# Patient Record
Sex: Female | Born: 1980 | Race: White | Hispanic: No | Marital: Single | Smoking: Current every day smoker
Health system: Southern US, Community
[De-identification: ages and names within clinical notes are randomized; demographics above are authoritative.]

## PROBLEM LIST (undated history)

## (undated) DIAGNOSIS — N289 Disorder of kidney and ureter, unspecified: Secondary | ICD-10-CM

## (undated) DIAGNOSIS — F419 Anxiety disorder, unspecified: Secondary | ICD-10-CM

## (undated) DIAGNOSIS — F32A Depression, unspecified: Secondary | ICD-10-CM

## (undated) DIAGNOSIS — F329 Major depressive disorder, single episode, unspecified: Secondary | ICD-10-CM

## (undated) DIAGNOSIS — G43909 Migraine, unspecified, not intractable, without status migrainosus: Secondary | ICD-10-CM

## (undated) DIAGNOSIS — F410 Panic disorder [episodic paroxysmal anxiety] without agoraphobia: Secondary | ICD-10-CM

## (undated) HISTORY — PX: TUBAL LIGATION: SHX77

---
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
## 2002-04-25 ENCOUNTER — Inpatient Hospital Stay (HOSPITAL_COMMUNITY): Admission: AD | Admit: 2002-04-25 | Discharge: 2002-04-25 | Payer: Self-pay | Admitting: *Deleted

## 2002-04-25 ENCOUNTER — Encounter: Payer: Self-pay | Admitting: *Deleted

## 2002-05-16 ENCOUNTER — Inpatient Hospital Stay (HOSPITAL_COMMUNITY): Admission: AD | Admit: 2002-05-16 | Discharge: 2002-05-16 | Payer: Self-pay | Admitting: Obstetrics and Gynecology

## 2002-06-03 ENCOUNTER — Ambulatory Visit (HOSPITAL_COMMUNITY): Admission: RE | Admit: 2002-06-03 | Discharge: 2002-06-03 | Payer: Self-pay | Admitting: Obstetrics and Gynecology

## 2002-06-03 ENCOUNTER — Encounter: Payer: Self-pay | Admitting: Obstetrics and Gynecology

## 2002-06-22 ENCOUNTER — Inpatient Hospital Stay (HOSPITAL_COMMUNITY): Admission: AD | Admit: 2002-06-22 | Discharge: 2002-06-22 | Payer: Self-pay | Admitting: Obstetrics and Gynecology

## 2002-07-15 ENCOUNTER — Inpatient Hospital Stay (HOSPITAL_COMMUNITY): Admission: AD | Admit: 2002-07-15 | Discharge: 2002-07-18 | Payer: Self-pay | Admitting: Obstetrics and Gynecology

## 2004-02-01 ENCOUNTER — Inpatient Hospital Stay (HOSPITAL_COMMUNITY): Admission: AD | Admit: 2004-02-01 | Discharge: 2004-02-02 | Payer: Self-pay | Admitting: Gynecology

## 2004-02-28 ENCOUNTER — Other Ambulatory Visit: Admission: RE | Admit: 2004-02-28 | Discharge: 2004-02-28 | Payer: Self-pay | Admitting: Obstetrics and Gynecology

## 2004-04-17 ENCOUNTER — Inpatient Hospital Stay (HOSPITAL_COMMUNITY): Admission: AD | Admit: 2004-04-17 | Discharge: 2004-04-17 | Payer: Self-pay | Admitting: Obstetrics and Gynecology

## 2004-06-17 ENCOUNTER — Emergency Department (HOSPITAL_COMMUNITY): Admission: EM | Admit: 2004-06-17 | Discharge: 2004-06-17 | Payer: Self-pay | Admitting: Family Medicine

## 2004-07-31 ENCOUNTER — Inpatient Hospital Stay (HOSPITAL_COMMUNITY): Admission: AD | Admit: 2004-07-31 | Discharge: 2004-07-31 | Payer: Self-pay | Admitting: Obstetrics and Gynecology

## 2004-08-26 ENCOUNTER — Encounter (INDEPENDENT_AMBULATORY_CARE_PROVIDER_SITE_OTHER): Payer: Self-pay | Admitting: Specialist

## 2004-08-26 ENCOUNTER — Inpatient Hospital Stay (HOSPITAL_COMMUNITY): Admission: RE | Admit: 2004-08-26 | Discharge: 2004-08-29 | Payer: Self-pay | Admitting: Obstetrics and Gynecology

## 2004-08-30 ENCOUNTER — Observation Stay (HOSPITAL_COMMUNITY): Admission: AD | Admit: 2004-08-30 | Discharge: 2004-08-31 | Payer: Self-pay | Admitting: Obstetrics and Gynecology

## 2005-03-28 ENCOUNTER — Inpatient Hospital Stay (HOSPITAL_COMMUNITY): Admission: AD | Admit: 2005-03-28 | Discharge: 2005-03-28 | Payer: Self-pay | Admitting: Obstetrics and Gynecology

## 2005-04-04 ENCOUNTER — Other Ambulatory Visit: Admission: RE | Admit: 2005-04-04 | Discharge: 2005-04-04 | Payer: Self-pay | Admitting: Obstetrics and Gynecology

## 2005-06-10 ENCOUNTER — Emergency Department (HOSPITAL_COMMUNITY): Admission: EM | Admit: 2005-06-10 | Discharge: 2005-06-11 | Payer: Self-pay | Admitting: Emergency Medicine

## 2005-07-04 ENCOUNTER — Inpatient Hospital Stay (HOSPITAL_COMMUNITY): Admission: AD | Admit: 2005-07-04 | Discharge: 2005-07-04 | Payer: Self-pay | Admitting: Obstetrics and Gynecology

## 2005-10-02 ENCOUNTER — Emergency Department (HOSPITAL_COMMUNITY): Admission: EM | Admit: 2005-10-02 | Discharge: 2005-10-02 | Payer: Self-pay | Admitting: Family Medicine

## 2006-07-07 ENCOUNTER — Inpatient Hospital Stay (HOSPITAL_COMMUNITY): Admission: AD | Admit: 2006-07-07 | Discharge: 2006-07-07 | Payer: Self-pay | Admitting: Obstetrics and Gynecology

## 2006-08-31 ENCOUNTER — Inpatient Hospital Stay (HOSPITAL_COMMUNITY): Admission: AD | Admit: 2006-08-31 | Discharge: 2006-09-01 | Payer: Self-pay | Admitting: Obstetrics

## 2006-11-17 ENCOUNTER — Emergency Department (HOSPITAL_COMMUNITY): Admission: EM | Admit: 2006-11-17 | Discharge: 2006-11-17 | Payer: Self-pay | Admitting: Family Medicine

## 2006-11-18 ENCOUNTER — Emergency Department (HOSPITAL_COMMUNITY): Admission: EM | Admit: 2006-11-18 | Discharge: 2006-11-18 | Payer: Self-pay | Admitting: Emergency Medicine

## 2008-05-01 ENCOUNTER — Emergency Department (HOSPITAL_COMMUNITY): Admission: EM | Admit: 2008-05-01 | Discharge: 2008-05-01 | Payer: Self-pay | Admitting: Family Medicine

## 2010-11-22 NOTE — Op Note (Signed)
NAMESIMREN, POPSON             ACCOUNT NO.:  [id number]   MEDICAL RECORD NO.:  [id number]          PATIENT TYPE:  INP   LOCATION:  9108                          FACILITY:  [facility]   PHYSICIAN:  Osborn Coho, M.D.   DATE OF BIRTH:  [birth year]   DATE OF PROCEDURE:  08/26/2004  DATE OF DISCHARGE:                                 OPERATIVE REPORT   PREOPERATIVE DIAGNOSES:  1.  Term intrauterine pregnancy at 39 weeks.  2.  Elective repeat cesarean section.  3.  Desires permanent sterilization.  4.  Multiparity.   POSTOPERATIVE DIAGNOSES:  1.  Term intrauterine pregnancy at 39 weeks.  2.  Elective repeat cesarean section.  3.  Desires permanent sterilization.  4.  Multiparity.   PROCEDURE:  Repeat cesarean section and bilateral tubal ligation.   SURGEON:  Osborn Coho, M.D.   ASSISTANT:  Elby Showers. Williams, C.N.M.   ANESTHESIA:  Spinal.   SPECIMENS:  Placenta and bilateral portions of fallopian tubes.   FLUIDS:  3000 cc.   ESTIMATED BLOOD LOSS:  800 cc.   URINE OUTPUT:  200 cc.   COMPLICATIONS:  None.   FINDINGS:  Live female infant with Apgars of 8 at one minute and 9 at five  minutes.  Normal-appearing bilateral ovaries and fallopian tubes.   DESCRIPTION OF PROCEDURE:  The patient was taken to the operating room after  the risks, benefits, and alternatives were discussed with the patient.  The  patient verbalized understanding and consent signed and witnessed.  The  patient was given a spinal anesthetic and prepped and draped in the normal  sterile fashion.   A Pfannenstiel skin incision was made at the site of the prior scar and  carried down to the underlying layer of fascia which was extended  bilaterally in the midline and extended bilateral with the Mayo scissors.  Kocher clamps were placed on the inferior aspect of the fascial incision and  the rectus muscle excised from the fascia.  The same was done on the  superior aspect of the fascial incision.  The  rectus muscle was separated in  the midline and the peritoneum entered bluntly.  The peritoneum was manually  extended and bladder blade placed.  A window in the lower uterine segment  was noted while bladder flap created with the Metzenbaum scissors.  The  uterine incision was made with the scalpel and extended bilaterally with the  bandage scissors.  The infant was delivered.  The oropharynx and nasopharynx  were bulb suctioned.  The remainder of the infant was delivered without  difficulty.  There was no nuchal cord.  Cefoxitin was administered after  delivery of the infant and cord clamped and cut.  The cord was clamped and  cut, and the infant was handed to the awaiting pediatrician.  Cord bloods  were collected.  The placenta was delivered via fundal massage.  The uterus  was exteriorized and cleared of all clots and debris.  He uterine incision  was repaired with 0 Vicryl in a running locked fashion.  A second  imbricating layer was performed.  A small hematoma  at the left lateral  aspect of the uterine incision was made hemostatic with a figure-of-eight  stitch.  No extension was noted.   The bilateral fallopian tubes were carried out to their fimbriated ends and  tubal ligation performed.  A Babcock was placed on the midportion of the  fallopian tube and two 0 plain ties used to ligate the tubes.  The bilateral  tubes were excised and sent to pathology.  The remaining pedicles were  cauterized with the Bovie.  Good hemostasis of the fallopian tubes were  noted before and after returning the uterus to the intra-abdominal cavity.  The intra-abdominal cavity was irrigated.  The uterine incision was  revisited and noted to be hemostatic.  The peritoneum was closed with 3-0  chromic in a running fashion.  The fascia was repaired with 0 Vicryl in a  running fashion.  The subcutaneous tissue was irrigated and made hemostatic  with the Bovie.  A subcuticular stitch was used to perform  closure of the  skin using 3-0 Monocryl.  Sponge, lap, and needle counts were correct.   The patient tolerated the procedure well and was returned to the recovery  room in good condition.      AR/MEDQ  D:  08/26/2004  T:  08/26/2004  Job:  [id number]

## 2010-11-22 NOTE — Op Note (Signed)
NAME:  Sally [name], Sally [name]                       ACCOUNT NO.:  [id number]   MEDICAL RECORD NO.:  [id number]                   PATIENT TYPE:  INP   LOCATION:  9198                                 FACILITY:  [facility]   PHYSICIAN:  Crist Fat. Rivard, M.D.              DATE OF BIRTH:  1981-04-12   DATE OF PROCEDURE:  07/15/2002  DATE OF DISCHARGE:                                 OPERATIVE REPORT   PREOPERATIVE DIAGNOSIS:  Intrauterine term pregnancy with previous cesarean  section, desiring repeat cesarean section.   POSTOPERATIVE DIAGNOSIS:  Intrauterine term pregnancy with previous cesarean  section, desiring repeat cesarean section.   ANESTHESIA:  Spinal.   PROCEDURE:  Repeat low transverse cesarean section.   SURGEON:  Crist Fat. Rivard, M.D.   ASSISTANT:  Elby Showers. Williams, C.N.M.   ESTIMATED BLOOD LOSS:  600 cubic centimeters.   DESCRIPTION OF PROCEDURE:  After being informed of the planned procedure  with possible complications, including bleeding, infection, injury to  bowels, bladder, or ureter, informed consent was obtained.  The patient was  taken to cesarean suite and given spinal anesthesia without any  complication.  She was placed in the dorsal decubitus position, pelvis  tilted to the left, prepped and draped in a sterile fashion, and a Foley  catheter was inserted in her bladder.   After assessing adequate level of anesthesia, the previous Pfannenstiel  incision was infiltrated with Marcaine 0.25% 20 cubic centimeters, and we  proceeded with a Pfannenstiel incision,  which was brought down bluntly to  the fascia.  Fascia was then incised transversely, linea alba was dissected,  and peritoneum was entered in a midline fashion.  Visceral peritoneum was  entered in low transverse fashion, allowing [place] to safely retract bladder by  developing a bladder flap.  Myometrium was then entered in a low transverse  fashion, first with knife, then bluntly.  Amniotic fluid was clear.   We  assisted the birth of a female infant in ROA presentation with a vacuum  extraction.  Baby was born at 11:12.  Mouth and nose were suctioned, cord  was clamped with two Kelly clamps and sectioned, and the baby was given to  the pediatrician present in the room.  Placenta was delivered spontaneously,  was complete.  The cord had three vessels, and uterine revision was  negative.  We then proceeded with closure of myometrium in two layers, first  with a running locked suture of 0 Vicryl, then with a Lembert suture of 0  Vicryl following the first one.  Hemostasis was completed with cautery on  the peritoneal edge.  Both pericolic gutters were cleansed, both tubes and  ovaries assessed and adequate.  Hemostasis was rechecked and adequate.  Under-fascia hemostasis was completed with cautery, and fascia was closed  with two running sutures of 0 Vicryl, meeting midline.  Fat subcutaneous  tissues were irrigated with warm saline, hemostasis was completed with  cautery, and skin was closed with staples.   The baby received an Apgar of 9 at one minute, 9 at five minutes, is named  [name], and weighs 6 pounds 9 ounces.  Estimated blood loss of 600  cubic centimeters.  Instrument and sponge count is complete x2.  The  procedure was very well-tolerated by the patient, who is taken to the  recovery room in a well and stable condition.                                                Crist Fat Rivard, M.D.    SAR/MEDQ  D:  07/15/2002  T:  07/15/2002  Job:  [id number]

## 2010-11-22 NOTE — H&P (Signed)
Sally [name], Sally [name]             ACCOUNT NO.:  [id number]   MEDICAL RECORD NO.:  [id number]          PATIENT TYPE:  INP   LOCATION:  9198                          [facility]ACILITY:  [facility]   PHYSICIAN:  Osborn Coho, M.D.   DATE OF BIRTH:  21-May-1981   DATE OF ADMISSION:  08/26/2004  DATE OF DISCHARGE:                                HISTORY & PHYSICAL   HISTORY OF PRESENT ILLNESS:  This is a 30 year old gravida 3 para 2-0-0-2 at  [redacted] weeks gestation who presents for an elective repeat cesarean section.  Pregnancy has been followed by Dr. Su Hilt and remarkable for:  1.  Previous C-section x2.  2.  History of oligohydramnios.  3.  History of migraines.  4.  Smoker.  5.  Desires elective sterilization.   PRENATAL LABORATORY DATA:  Hemoglobin 12.3, platelets 264.  Blood type O  positive, antibody screen negative. RPR nonreactive. Rubella immune.  Hepatitis negative. HIV negative.   OBSTETRICAL HISTORY:  Remarkable for C-section in 2001 of a female infant at  [redacted] weeks gestation weighing 6 pounds 3 ounces remarkable for oligohydramnios  and breech. She had a repeat C-section in 2004 of a female infant at [redacted] weeks  gestation weighing 6 pounds 6 ounces which was uncomplicated.   MEDICAL HISTORY:  1.  Remarkable for anemia with pregnancy.  2.  History of hyperemesis  3.  History of oligohydramnios with the first pregnancy.  4.  Childhood varicella.  5.  History of migraines.  6.  The patient is also a smoker, three cigarettes per day.   FAMILY HISTORY:  Remarkable for a grandmother and grandfather with MI. Two  grandmothers with hypertension. Mother with varicosities. Grandmother with  diabetes. Aunt with renal failure. Maternal relatives with unknown type of  cancer. Aunt with bipolar disorder.   SURGICAL HISTORY:  Remarkable for C-sections x2.   GENETIC HISTORY:  Remarkable for a brother with a heart murmur.   SOCIAL HISTORY:  The patient is single. Father of the baby, Viviann Spare, is  involved and supportive. She is a Futures trader. She does not report a religious  affiliation. She denies any alcohol or drug abuse but does smoke cigarettes.   HISTORY OF CURRENT PREGNANCY:  The patient entered care at [redacted] weeks  gestation. She had some depression at 16 weeks and was referred for  counseling. Quad screen was normal. Ultrasound at 18 weeks was normal. She  had some migraine headaches and was referred to the headache center for  that. She signed tubal ligation papers at 24 weeks. The rest of the  pregnancy was uneventful.   OBJECTIVE DATA:  VITAL SIGNS:  Stable, afebrile.  HEENT:  Within normal limits. Thyroid normal, not enlarged.  CHEST:  Clear to auscultation.  HEART:  Regular rate and rhythm.  ABDOMEN:  Gravid, 36 cm, vertex to Leopold's. Fetal heart rate 150.  PELVIC:  Deferred.  EXTREMITIES:  Within normal limits.   ASSESSMENT:  1.  Intrauterine pregnancy at 39 weeks.  2.  Previous cesarean section x2.  3.  Desires repeat cesarean section.  4.  Desires elective sterilization.   PLAN:  Admit to operating suite for repeat C-section and tubal ligation.      MLW/MEDQ  D:  08/26/2004  T:  08/26/2004  Job:  [id number]

## 2010-11-22 NOTE — H&P (Signed)
[name], [name]             ACCOUNT NO.:  [id number]   MEDICAL RECORD NO.:  [id number]          PATIENT TYPE:  INP   LOCATION:  9306                          [facility]ACILITY:  [facility]   PHYSICIAN:  Naima A. Dillard, M.D. DATE OF BIRTH:  Sep 13, 1980   DATE OF ADMISSION:  08/30/2004  DATE OF DISCHARGE:                                HISTORY & PHYSICAL   HISTORY OF PRESENT ILLNESS:  Sally [name] is a 30 year old white female  gravida 3, para 3-0-0-3, now four days status post repeat low transverse  cesarean section who was seen at the office today for evaluation complaining  of severe mid back pain.  She has rated her pain throughout the day today as  a 10/10 and reports no relief with Tylox or Motrin.  Her back pain started  earlier today.  She denies any nausea, vomiting or malaise.  She also  reports a bad cough which she started while she was in the hospital.  She  reported a fever of 100.1 earlier today but none subsequently and her last  dose of Motrin was at 10 a.m. this morning.  She is bottle feeding without  difficulty.  She denies any breast tenderness but reports fullness.  She  denies any increased abdominal pain, increased lochia or any other  complaints at this time.  She does report smoking.   PAST OBSTETRICAL HISTORY:  She is a gravida 3, para 3-0-0-3, most recently  delivered four days ago by repeat low transverse cesarean section.  She is  currently bottle feeding her infant.  She has no other contributory GYN  history.   ALLERGIES:  She has no known drug allergies.   PAST SURGICAL HISTORY:  C-section x3.   PAST MEDICAL HISTORY:  She reports a history of walking pneumonia when she  was in high school.   FAMILY HISTORY:  Noncontributory.   SOCIAL HISTORY:  She is a 30 year old white female who lives with her mother  and has good support from her family.  She is a smoker, approximately three  cigarettes a day.  She denies any illicit drug use or alcohol use.  She  has  no religious or cultural beliefs that might affect her care.   PHYSICAL EXAMINATION:  VITAL SIGNS:  Temperature is 98.6, pulse is 84,  respirations 20, blood pressure 125/85.  Her O2 saturation is 97-98%.  She  is 5 feet 5 inches and 167 pounds today.  CHEST:  Her chest has bilateral lower rubs posteriorly.  She has thick gray-  green sputum that was sent for culture.  HEART:  Regular rhythm and rate.  BREASTS:  Soft, full, nontender with no redness.  ABDOMEN:  Her abdomen has positive bowel sounds, soft and nontender.  Incision is healing well.  EXTREMITIES:  Within normal limits.  She has CVA tenderness on the right  side and tenderness to pressure in the middle of her back near the epidural  site.  PELVIC:  Her pelvic exam was deferred at this time.  Her lochia is within  normal limits.   LABORATORY DATA:  Her WBC count is 21.5,  previously was 11.8.  Her  hemoglobin is 12.4.  It was previously 11.1.  Her hematocrit is 36.7.  It  was previously 32.1.  Platelets are 353,000, previously 233,000.  Neutrophils are 88%.  Absolute neutrophils are 18.9%.  Her SGOT is 42, SGPT  is 33 and her BUN is 7, creatinine is 0.7.  Clean catch urine is completely  negative except for moderate blood.   ASSESSMENT:  1.  Four days post repeat low transverse cesarean section.  2.  Upper respiratory infection.  3.  Back pain.  Questionable muscle spasms versus kidney stone.   PLAN:  Per consult with Dr. Normand Sloop to admit for IV antibiotic therapy, pain  management and closer observation.      SJD/MEDQ  D:  08/30/2004  T:  08/30/2004  Job:  [id number]

## 2010-11-22 NOTE — Discharge Summary (Signed)
Sally [name], Sally [name]             ACCOUNT NO.:  [id number]   MEDICAL RECORD NO.:  [id number]          PATIENT TYPE:  INP   LOCATION:  9306                          [facility]   PHYSICIAN:  Sally [name], M.D. DATE OF BIRTH:  1980/12/14   DATE OF ADMISSION:  08/30/2004  DATE OF DISCHARGE:  08/31/2004                                 DISCHARGE SUMMARY   ADMISSION DIAGNOSES:  1.  Four days postoperative cesarean section.  2.  Upper respiratory tract infection.  3.  Back pain, differential diagnosis muscle spasms versus kidney stone.   DISCHARGE DIAGNOSES:  1.  Upper respiratory tract infection.  2.  Five days postoperative cesarean section.  3.  Back pain.   PROCEDURE:  IV antibiotics.   HOSPITAL COURSE:  Sally [name] is a 30 year old gravida 3, para 3-0-0-3,  who was admitted on August 30, 2004, at four days postoperative cesarean  section, who was seen at the office for evaluation of severe mid back pain.  She also had an upper respiratory tract infection which had begun while she  was in the hospital.  She was bottle feeding, but having no difficulty with  engorgement.  She denied any other symptoms.  On admission, she was noted to  have bilateral lower rubs as chest sounds.  She was having thick, green-gray  sputum which was cultured, results are pending.  Her incision was clean,  dry, and intact.  Her white blood cell count was 21.5 with 88 neutrophils,  previous value prior to discharge from the hospital during her cesarean  section was 11.8.  Hemoglobin was 12.4 and platelets were 353.  SGOT was 42  and SGPT was 33.  Creatinine was normal.  Clean catch urine was negative  except for moderate blood.  Anesthesia was consulted, Sally [name], M.D.  felt that based on the symptoms, it was likely that she was having muscle  spasms and inflammation in addition to the upper respiratory tract  infection.  She was placed on IV antibiotics and pain management.  By the  morning of August 31, 2004, she was feeling better and desired to go home.  She had been placed on Flexeril which was helping her back pain.  She still  had a significant cough.  She had been on Tussionex at home. Her temperature  maximum was 99.9 at 9 p.m. on August 30, 2004.  Lung sounds were still  noted to have adventitious sounds in all lobes.  Abdomen was normal,  incision was clean, dry, and intact.   LABORATORY DATA:  Hemoglobin was 10, white blood cell count down to 11.8,  neutrophils were normal.  SGOT 28, SGPT 25.  She had received one dose of  Zithromax p.o. and one dose of Rocephin the evening before.   The patient was tolerating a regular diet.  Sally [name] was consulted and  she also saw the patient.  Her O2 saturation was 96% on room air.  Her back  pain was improved.  In light of the patient's improvement, her desire to go  home, and her decreased white blood cell count,  it was determined to  discharge the patient home.  She did receive an albuterol treatment prior to  discharge.  Chest x-ray also was negative during her hospital stay.  The  patient was deemed to have received the full benefit of her hospital stay  and was discharged home.   DISCHARGE INSTRUCTIONS:  The patient is to continue postoperative cesarean  section instructions.  She is also to continue monitoring for fever and any  other issues.   DISCHARGE MEDICATIONS:  1.  Z-Pak to be used as directed.  2.  Tussionex 5 mL every 12 hours.  3.  Flexeril 10 mg t.i.d. p.r.n.   FOLLOW UP:  Discharge follow-up will occur in one week at [facility] or p.r.n.      VLL/MEDQ  D:  08/31/2004  T:  09/02/2004  Job:  [id number]

## 2010-11-22 NOTE — H&P (Signed)
[facility]  Patient:    Sally [name], Sally [name]                    MRN: [id number] Adm. Date:  [id number] Attending:  Silverio Lay A                         History and Physical  REASON FOR ADMISSION:         1. Intrauterine pregnancy at 37 weeks and                                  6 days.                               2. Breech presentation.                               3. Severe oligohydramnios.  HISTORY OF PRESENT ILLNESS:   This is an 30 year old, single, white female, gravida 1, para 0, with a due date by ultrasound of June 16, 2000, who was seen at maternity admissions to undergo external cephalic version.  She has a previous history of oligohydramnios, which resolved rapidly with bed rest. Upon ultrasound examination today, the baby is complete breech with an amniotic fluid index of 1.8, which puts her at way below the third percentile. The NST right now is reactive with a baseline in the 150s-160s.  The patient otherwise reports good fetal activity, denies any contractions, denies any symptoms of pregnancy-induced hypertension, and also denies any leaking of fluid or bleeding.  Her prenatal course reveals blood type O+, RPR nonreactive, rubella nonimmune, HBSAG negative, HIV nonreactive, gonorrhea negative, and chlamydia negative. A 16-week AFP was elevated, but with corrected estimated deliver date came back to normal.  A 20-week ultrasound had normal anatomy survey and anterior placenta.  A 28-week glucose tolerance test was within normal limits.  A 33-week ultrasound revealed an estimated fetal weight of 2029 g with a amniotic fluid index.  A 36-week ultrasound revealed a normal estimated fetal weight, but oligohydramnios.  The patient was then put on completed bed rest.  An AFI was re-evaluated 48 hours later and was within normal limits.  A 35-week group B streptococcus was positive.  ALLERGIES:                    No known drug  allergies.  FAMILY HISTORY:               Paternal grandmother with diabetes.  SOCIAL HISTORY:               Single.  Nonsmoker.  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  PHYSICAL EXAMINATION:         Temperature 100.5 degrees, blood pressure normal.  GENERAL APPEARANCE:           In no acute distress.  HEENT:                        Negative.  LUNGS:                        Clear.  HEART:  Normal.  BACK:                         No frank CVA tenderness.  ABDOMEN:                      Gravid, nontender.  Breech presentation.  NST reactive.  PELVIC:                       Vaginal exam deferred.  EXTREMITIES:                  Negative.  LABORATORY RESULTS:           The white blood cell count is elevated at 19.1 and the hemoglobin is normal.  ASSESSMENT:                   Intrauterine pregnancy at 37 weeks and 6 days with severe oligohydramnios and breech presentation with borderline low-grade temperature and increased white blood cell count of unclear etiology.  Group B streptococcus positive.  PLAN:                         The patient will be taken to the OR to undergo primary low transverse cesarean section.  The procedure, as well as complications, including bleeding, infection, and injury to bladder, bowels, and ureters, were discussed with the patient and informed consent was obtained.  The possibility of chorioamnionitis was also discussed and the patient will receive antibiotics preoperatively and will be followed closely postoperatively. DD:  06/01/00 TD:  06/01/00 Job: [id number]

## 2010-11-22 NOTE — Op Note (Signed)
[facility]  Patient:    Sally [name], Sally [name]                    MRN: [id number] Proc. Date: 06/01/00 Adm. Date:  [id number] Attending:  Silverio Lay A                           Operative Report  DATE OF BIRTH:                07/06/1981  PREOPERATIVE DIAGNOSES:       A 38+ week gestation, breech presentation with oligohydramnios.  POSTOPERATIVE DIAGNOSES:      A 38+ week gestation, breech presentation with oligohydramnios, birth of a baby girl.  INTERVENTION:                 Primary low transverse cesarean section.  SURGEON:                      Reuben Likes, M.D.  ASSISTANT:                    Pershing Cox, M.D.  ANESTHESIOLOGIST:             Belva Agee, M.D.  PROCEDURE:                    Under spinal anesthesia the patient is in 15 degree left decubitus position.  She is prepped with Betadine on the abdominal, suprapubic, vulvar, and vaginal areas.  The bladder catheter is inserted and the patient is draped as usual.  A Pfannenstiel incision is made with a scalpel.  We opened the aponeurosis transversely with the Mayo scissors.  The recti muscles are separated from the aponeurosis from the midline.  We opened the parietal peritoneum longitudinally with the Metzenbaum scissors.  The visceroperitoneum is opened transversely on the lower uterine segment with the Metzenbaum scissors.  The bladder is reclined downward and the bladder retractor is inserted.  A transverse incision is made with the scalpel over the lower uterine segment, then it is prolonged on each side with dressing scissors.  Amniotic fluid is clear.  Baby is frank breech with back on the left side.  Birth at 41 of a baby girl.  The cord is clamped and cut. The baby is given to the neonatologists.  Apgars are 8 and 9.  The cord blood is taken.  Spontaneous evacuation of the placenta.  Uterine revision done. The antibiotics Ancef 2 g IV is given.  Pitocin is  started in the IV fluids and the uterus is contracting well.  Closure of the hysterotomy with Vicryl 0 in a full plane locked running suture.  Two hemostatic "X" stitches are made, one in the middle of the incision, the other one at the right angle to complete hemostasis.  Hemostasis is adequate at that level.  We then put the uterus back inside the abdomen.  Both ovaries and both tubes as well as the uterus were normal in appearance and size.  The pericolonic gutters are emptied of blood clots.  Irrigation and suction is done.  We then verify hemostasis on the bladder flap and on the recti muscle.  Hemostasis is completed with the electrocautery.  We then close the aponeurosis with two half running sutures with Vicryl 0.  Hemostasis is completed on the adipose tissue with the electrocautery.  The subcutaneous tissue is  infiltrated with Marcaine 0.25% 20 cc total.  We then reapproximate the skin with staples.  A dry dressing is applied.  The count of compressives and instruments was complete x 2.  The estimated blood loss was 500 cc.  No complication occurred.  The patient was transferred to the recovery room in good status.  Note that the blood group is O+, antibody negative.  Rubella status:  Nonimmune.  She will receive the rubella vaccine postpartum. DD:  06/01/00 TD:  06/01/00 Job: [id number]

## 2010-11-22 NOTE — Discharge Summary (Signed)
Sally [name], Sally [name]             ACCOUNT NO.:  [id number]   MEDICAL RECORD NO.:  [id number]          PATIENT TYPE:  INP   LOCATION:  9108                          FACILITY:  [facility]   PHYSICIAN:  Osborn Coho, M.D.   DATE OF BIRTH:  01-Sep-1980   DATE OF ADMISSION:  08/26/2004  DATE OF DISCHARGE:  08/29/2004                                 DISCHARGE SUMMARY   ADMISSION DIAGNOSES:  1.  Intrauterine pregnancy at [redacted] weeks gestation.  2.  Previous cesarean section x2.  3.  Desires repeat cesarean section.  4.  Desires elective sterilization.   DISCHARGE DIAGNOSES:  1.  Intrauterine pregnancy at [redacted] weeks gestation.  2.  Previous cesarean section x2.  3.  Desires repeat cesarean section.  4.  Desires elective sterilization.  5.  Status post cesarean delivery of a viable female infant, Apgar's 8 and 9      weighing 6 pounds 14 ounces without complications. She is also status      post an elective bilateral tubal ligation without complications.   PROCEDURE:  1.  Spinal anesthesia.  2.  Repeat low transverse cesarean section.  3.  Elective bilateral tubal ligation.   HOSPITAL COURSE:  The patient was admitted for an elective repeat C section  and tubal ligation performed under spinal anesthesia by Dr. Su Hilt with no  complications. Estimated blood loss was 800 mL.  On postoperative day #1,  she did well passing gas with no vomiting, voiding spontaneously, hemoglobin  was 11.1 and routine postop care was given.  On postoperative day #2, she  continued to improve, was up and about more and tolerating her diet.  On  postoperative day #3, she was ready to go home, vital signs were stable, she  was afebrile.  Chest was clear, heart regular rate and rhythm. Abdomen was  soft and appropriately tender, incision was clean, drain intact with  subcuticular stitches. Lochia was small, extremities within normal limits  and was she was deemed to have received the full benefit of her hospital  stay  and was discharged home.   DISCHARGE MEDICATIONS:  1.  Motrin 600 mg p.o. q.6 h. p.r.n.  2.  Tylox 1-2 q.4 h. p.r.n.   DISCHARGE LABS:  White blood cell count 11.8, hemoglobin 11.1, platelets  233, RPR nonreactive.   DISCHARGE INSTRUCTIONS:  Per [facility] handout.   FOLLOW UP:  In six weeks or p.r.n.      MLW/MEDQ  D:  08/29/2004  T:  08/29/2004  Job:  [id number]

## 2010-11-22 NOTE — H&P (Signed)
   NAME:  Sally [name], Sally [name]                       ACCOUNT NO.:  [id number]   MEDICAL RECORD NO.:  [id number]                   PATIENT TYPE:  INP   LOCATION:  9198                                 FACILITY:  [facility]   PHYSICIAN:  Crist Fat. Rivard, M.D.              DATE OF BIRTH:  April 23, 1981   DATE OF ADMISSION:  07/15/2002  DATE OF DISCHARGE:                                HISTORY & PHYSICAL   HISTORY OF PRESENT ILLNESS:  This is a 30 year old gravida 2 para 1-0-0-1 at  23 and three-sevenths weeks who presents for an elective repeat C section.  She denies any leaking, bleeding, or contractions and reports positive fetal  movement.  Pregnancy has been followed by Dr. Estanislado Pandy since 30 weeks and has  been remarkable for:  1. Late to care.  2. Previous low transverse cesarean section; plans elective repeat.  3. UTI at 28 weeks.  4. History of oligohydramnios with previous pregnancy.  5. Smoker.  6. History of migraines.  7. Equivocal rubella.   Past medical history is incomplete secondary to complete record not  available.   OBSTETRICAL HISTORY:  Remarkable for a low transverse cesarean section  approximately two years ago for failure to progress.  Remarkable for  oligohydramnios during that pregnancy.   MEDICAL HISTORY:  1. Smoker.  2. Equivocal rubella.  3. History of recent UTI.  4. History of migraines.   FAMILY HISTORY:  Noncontributory.   SOCIAL HISTORY:  The patient is accompanied by her mother today.  Denies any  alcohol, tobacco, or drug use.   GENETIC HISTORY:  Unremarkable.   PRENATAL LABORATORY DATA:  Hemoglobin 10.7, platelets 293.  Blood type O  positive, antibody screen negative.  RPR nonreactive.  Rubella equivocal.  HbsAg negative.  HIV declined.  Gonorrhea negative, chlamydia negative.  Glucose challenge within normal limits.  Pap is not reported.  Group B strep  result not available.  Hemoglobin at 32 weeks was 11.8.   OBJECTIVE DATA:  VITAL SIGNS:  Stable,  afebrile.  HEENT:  Within normal limits.  NECK:  Thyroid normal, not enlarged.  CHEST:  Clear to auscultation.  HEART:  Regular rate and rhythm.  BREASTS:  Soft, nontender.  ABDOMEN:  Gravid at 37 cm, vertex to Leopold's.  Fetal heart rate 160s.  No  contractions evident.  PELVIC:  Exam deferred at this time.  EXTREMITIES:  Within normal limits.   ASSESSMENT:  1. Intrauterine pregnancy at 29 and three-sevenths weeks.  2. Previous cesarean section, desires elective repeat cesarean section.   PLAN:  Admit for elective repeat cesarean section today under regional  anesthesia with Dr. Estanislado Pandy.     Marie L. Williams, C.N.M.                 Crist Fat Rivard, M.D.    MLW/MEDQ  D:  07/15/2002  T:  07/15/2002  Job:  [id number]

## 2010-11-22 NOTE — Discharge Summary (Signed)
   NAME:  Sally [name], Sally [name]                       ACCOUNT NO.:  [id number]   MEDICAL RECORD NO.:  [id number]                   PATIENT TYPE:  INP   LOCATION:  9107                                 FACILITY:  [facility]   PHYSICIAN:  Crist Fat. Rivard, M.D.              DATE OF BIRTH:  04/21/1981   DATE OF ADMISSION:  07/15/2002  DATE OF DISCHARGE:  07/18/2002                                 DISCHARGE SUMMARY   ADMISSION DIAGNOSES:  1. Term pregnancy.  2. Previous cesarean section.   DISCHARGE DIAGNOSES:  1. Term pregnancy.  2. Previous cesarean section.  3. Cesarean delivery of a female infant named Christian; Apgars 9 and 9;     weighing 6 pounds 9 ounces.   HOSPITAL PROCEDURES:  1. Spinal anesthesia.  2. Repeat low transverse cesarean section.   HOSPITAL COURSE:  The patient was admitted for an elective repeat C section  which was performed by Dr. Estanislado Pandy under spinal anesthesia.  The infant  weighed 6 pounds 9 ounces with Apgars of 9 and 9 and was sent to the nursery  stable.  Estimated blood loss was 600 cc.  Postoperative day #1 the patient  was doing well.  She discussed her plans for Depo-Provera for contraception.  She was bottle feeding her infant.  On postoperative day #2 and #3 her vital  signs remained stable and her temperature remained afebrile.  Chest clear to  auscultation.  Heart rate regular rate and rhythm.  Abdomen was soft and  appropriate tender.  Incision was clean and intact with staples, which were  removed prior to discharge.  Extremities were within normal limits.  She was  deemed to have received the full benefit of her hospital stay and was  discharged to home.   DISCHARGE MEDICATIONS:  1. Motrin 600 mg p.o. q.6h. p.r.n.  2. Tylox one to two p.o. q.4h. p.r.n.  3. Depo-Provera 150 mg IM prior to discharge.   DISCHARGE LABORATORY DATA:  Hemoglobin 9.2.  Urine was negative.  White  blood cell count 11.7, platelet count 273.  RPR nonreactive.   DISCHARGE  INSTRUCTIONS:  Per [facility] handout.   DISCHARGE FOLLOW-UP:  In six weeks or p.r.n.     Marie L. Williams, C.N.M.                 Crist Fat Rivard, M.D.    MLW/MEDQ  D:  07/18/2002  T:  07/18/2002  Job:  [id number]

## 2010-11-22 NOTE — Discharge Summary (Signed)
[facility]  Patient:    BRELEE, RENK                    MRN: [id number] Adm. Date:  [id number] Disc. Date: [id number] Attending:  Silverio Lay A                           Discharge Summary  ADMISSION DIAGNOSES:          A 38+ week gestation, breech presentation with oligohydramnios.  DISCHARGE DIAGNOSES:          A 38+ week gestation, breech presentation with oligohydramnios, birth of a baby girl.  HOSPITAL COURSE:              Mrs. Bafinger was admitted on June 01, 2000 with an intrauterine pregnancy 38+ week gestation, breech presentation with severe oligohydramnios.  Her amniotic fluid index was 1.8 cm below the third percentile.  The estimated fetal weight at 36 weeks was within normal limits. NST is currently reactive.  No decelerations.  She had a primary low transverse cesarean section.  Baby girl was born at 63.  Apgars were 8 and 9.  Baby was in frank breech presentation, back on the left.  Ancef 2 g IV was given after the birth of the baby and Pitocin was started on the IV fluids. The estimated blood loss was 500 cc.  No complication occurred.  The patient was transferred to the recovery room in good stages.  The postoperative course was unremarkable.  She remained stable with postoperative hemoglobin of 8.3, then rising to 9.5.  To note, her preoperative hemoglobin was only 10.7. Patient remained afebrile.  She was discharged on November 29 on postoperative day #3 in stable state.  She was given Tylox p.o. p.r.n. for pain and was advised to use iron sulfate b.i.d.  She will follow up at [facility] office in four to six weeks.  Postoperative advice was given as well as our postpartum booklet. DD:  06/28/00 TD:  06/28/00 Job: [id number]

## 2011-01-16 ENCOUNTER — Encounter: Payer: Self-pay | Admitting: Family Medicine

## 2011-12-24 ENCOUNTER — Emergency Department (HOSPITAL_COMMUNITY)
Admission: EM | Admit: 2011-12-24 | Discharge: 2011-12-24 | Disposition: A | Discharge status: Home / Self Care | Payer: Self-pay | Source: Home / Self Care | Attending: Emergency Medicine | Admitting: Emergency Medicine

## 2012-04-27 ENCOUNTER — Emergency Department (HOSPITAL_COMMUNITY)
Admission: EM | Admit: 2012-04-27 | Discharge: 2012-04-27 | Disposition: A | Discharge status: Home / Self Care | Payer: Self-pay | Attending: Emergency Medicine | Admitting: Emergency Medicine

## 2012-04-27 ENCOUNTER — Encounter (HOSPITAL_COMMUNITY): Payer: Self-pay

## 2012-04-27 DIAGNOSIS — J069 Acute upper respiratory infection, unspecified: Secondary | ICD-10-CM | POA: Insufficient documentation

## 2012-04-27 DIAGNOSIS — F17200 Nicotine dependence, unspecified, uncomplicated: Secondary | ICD-10-CM | POA: Insufficient documentation

## 2012-04-27 MED ORDER — PROMETHAZINE-CODEINE 6.25-10 MG/5ML PO SYRP: 5.0000 mL | ORAL_SOLUTION | ORAL | Status: DC | PRN

## 2012-04-27 NOTE — ED Notes (Signed)
Pt c/o cold symptoms and sore throat since yesterday.

## 2012-04-27 NOTE — Discharge Instructions (Signed)

## 2012-04-28 NOTE — ED Provider Notes (Signed)
History     CSN: [id number]  Arrival date & time 04/27/12  1013   First MD Initiated Contact with Patient 04/27/12 1151      Chief Complaint  Patient presents with  . URI  . Sore Throat    (Consider location/radiation/quality/duration/timing/severity/associated sxs/prior treatment) Patient is a 31 y.o. female presenting with URI. The history is provided by the patient.  URI The primary symptoms include fatigue, sore throat and cough. Primary symptoms do not include fever, headaches, abdominal pain, nausea, arthralgias or rash. Primary symptoms comment: nasal congestion with clear rhinorrhea.  Cough has been productive of yellow sputum The current episode started yesterday. This is a new problem. The problem has been gradually worsening.  The sore throat began yesterday. The sore throat is moderate in intensity. The sore throat is not accompanied by trouble swallowing, hoarse voice or stridor.  The cough is non-productive.  Symptoms associated with the illness include rhinorrhea. The illness is not associated with chills, plugged ear sensation, sinus pressure or congestion. The following treatments were addressed: Acetaminophen was ineffective. A decongestant was not tried. Aspirin was not tried. NSAIDs were not tried.    History reviewed. No pertinent past medical history.  Past Surgical History  Procedure Date  . Cesarean section     No family history on file.  History  Substance Use Topics  . Smoking status: Current Every Day Smoker  . Smokeless tobacco: Not on file  . Alcohol Use: No    OB History    Grav Para Term Preterm Abortions TAB SAB Ect Mult Living                  Review of Systems  Constitutional: Positive for fatigue. Negative for fever and chills.  HENT: Positive for sore throat and rhinorrhea. Negative for congestion, hoarse voice, trouble swallowing, neck pain and sinus pressure.   Eyes: Negative.   Respiratory: Positive for cough. Negative for  chest tightness, shortness of breath and stridor.   Cardiovascular: Negative for chest pain.  Gastrointestinal: Negative for nausea and abdominal pain.  Genitourinary: Negative.   Musculoskeletal: Negative for joint swelling and arthralgias.  Skin: Negative.  Negative for rash and wound.  Neurological: Negative for dizziness, weakness, light-headedness, numbness and headaches.  Hematological: Negative.   Psychiatric/Behavioral: Negative.     Allergies  Review of patient's allergies indicates no known allergies.  Home Medications   Current Outpatient Rx  Name Route Sig Dispense Refill  . ACETAMINOPHEN 325 MG PO TABS Oral Take 325 mg by mouth as needed.      Marland Kitchen PROMETHAZINE-CODEINE 6.25-10 MG/5ML PO SYRP Oral Take 5 mLs by mouth every 4 (four) hours as needed for cough. 120 mL 0    BP 106/64  Pulse 71  Temp 98.4 F (36.9 C) (Oral)  Resp 18  Ht 5\' 5"  (1.651 m)  Wt 175 lb (79.379 kg)  BMI 29.12 kg/m2  SpO2 99%  LMP 04/24/2012  Physical Exam  Constitutional: She is oriented to person, place, and time. She appears well-developed and well-nourished.  HENT:  Head: Normocephalic and atraumatic.  Right Ear: Tympanic membrane, external ear and ear canal normal.  Left Ear: Tympanic membrane, external ear and ear canal normal.  Nose: Mucosal edema and rhinorrhea present.  Mouth/Throat: Uvula is midline and mucous membranes are normal. Posterior oropharyngeal erythema present. No oropharyngeal exudate, posterior oropharyngeal edema or tonsillar abscesses.  Eyes: Conjunctivae normal are normal.  Pulmonary/Chest: Effort normal. No respiratory distress. She has no wheezes. She has  no rales.  Abdominal: Soft. There is no tenderness.  Musculoskeletal: Normal range of motion.  Neurological: She is alert and oriented to person, place, and time.  Skin: Skin is warm and dry. No rash noted.  Psychiatric: She has a normal mood and affect.    ED Course  Procedures (including critical care  time)   Labs Reviewed  RAPID STREP SCREEN  LAB REPORT - SCANNED   No results found.   1. URI, acute       MDM  Exam and symptoms consistent with viral URI.  She was prescribed Phenergan with codeine to help with throat pain and cough.  Encouraged rest, increase fluids, Tylenol or Motrin.  Recheck by PCP if not improving over the next week.        Burgess Amor, PA 04/28/12 1719

## 2012-04-29 NOTE — ED Provider Notes (Signed)
Medical screening examination/treatment/procedure(s) were performed by non-physician practitioner and as supervising physician I was immediately available for consultation/collaboration.   Shelda Jakes, MD 04/29/12 1300

## 2012-12-07 ENCOUNTER — Emergency Department (HOSPITAL_COMMUNITY)
Admission: EM | Admit: 2012-12-07 | Discharge: 2012-12-07 | Discharge status: Against Medical Advice | Payer: Self-pay | Attending: Emergency Medicine | Admitting: Emergency Medicine

## 2012-12-07 ENCOUNTER — Encounter (HOSPITAL_COMMUNITY): Payer: Self-pay

## 2012-12-07 DIAGNOSIS — F17200 Nicotine dependence, unspecified, uncomplicated: Secondary | ICD-10-CM | POA: Insufficient documentation

## 2012-12-07 DIAGNOSIS — R55 Syncope and collapse: Secondary | ICD-10-CM | POA: Insufficient documentation

## 2012-12-07 DIAGNOSIS — Z3202 Encounter for pregnancy test, result negative: Secondary | ICD-10-CM | POA: Insufficient documentation

## 2012-12-07 DIAGNOSIS — R109 Unspecified abdominal pain: Secondary | ICD-10-CM | POA: Insufficient documentation

## 2012-12-07 DIAGNOSIS — M549 Dorsalgia, unspecified: Secondary | ICD-10-CM | POA: Insufficient documentation

## 2012-12-07 DIAGNOSIS — Z9889 Other specified postprocedural states: Secondary | ICD-10-CM | POA: Insufficient documentation

## 2012-12-07 DIAGNOSIS — Z9851 Tubal ligation status: Secondary | ICD-10-CM | POA: Insufficient documentation

## 2012-12-07 DIAGNOSIS — R61 Generalized hyperhidrosis: Secondary | ICD-10-CM | POA: Insufficient documentation

## 2012-12-07 DIAGNOSIS — R197 Diarrhea, unspecified: Secondary | ICD-10-CM | POA: Insufficient documentation

## 2012-12-07 DIAGNOSIS — R0602 Shortness of breath: Secondary | ICD-10-CM | POA: Insufficient documentation

## 2012-12-07 LAB — CBC WITH DIFFERENTIAL/PLATELET
Hemoglobin: 13.2 g/dL (ref 12.0–15.0)
Lymphs Abs: 1.9 10*3/uL (ref 0.7–4.0)
Monocytes Relative: 3 % (ref 3–12)
Neutro Abs: 10.5 10*3/uL — ABNORMAL HIGH (ref 1.7–7.7)
Neutrophils Relative %: 82 % — ABNORMAL HIGH (ref 43–77)
RBC: 4.24 MIL/uL (ref 3.87–5.11)
WBC: 12.8 10*3/uL — ABNORMAL HIGH (ref 4.0–10.5)

## 2012-12-07 LAB — URINE MICROSCOPIC-ADD ON

## 2012-12-07 LAB — URINALYSIS, ROUTINE W REFLEX MICROSCOPIC
Nitrite: NEGATIVE
Specific Gravity, Urine: >1.03 — ABNORMAL HIGH (ref 1.005–1.030)
pH: 6 (ref 5.0–8.0)

## 2012-12-07 LAB — BASIC METABOLIC PANEL
BUN: 19 mg/dL (ref 6–23)
Chloride: 102 mEq/L (ref 96–112)
GFR calc Af Amer: >90 mL/min (ref >90)
Glucose, Bld: 161 mg/dL — ABNORMAL HIGH (ref 70–99)
Potassium: 3.5 mEq/L (ref 3.5–5.1)
Sodium: 138 mEq/L (ref 135–145)

## 2012-12-07 MED ORDER — METOCLOPRAMIDE HCL 5 MG/ML IJ SOLN
10.0000 mg | Freq: Once | INTRAMUSCULAR | Status: AC
  Administered 2012-12-07: 10 mg via INTRAVENOUS
  Filled 2012-12-07: qty 2

## 2012-12-07 MED ORDER — DIPHENHYDRAMINE HCL 50 MG/ML IJ SOLN
25.0000 mg | Freq: Once | INTRAMUSCULAR | Status: AC
  Administered 2012-12-07: 25 mg via INTRAVENOUS
  Filled 2012-12-07: qty 1

## 2012-12-07 NOTE — ED Provider Notes (Signed)
History     This chart was scribed for Ward Givens, MD, MD by Smitty Pluck, ED Scribe. The patient was seen in room APAH2/APAH2 and the patient's care was started at 7:18 PM.   CSN: [id number]  Arrival date & time 12/07/12  1802      Chief Complaint  Patient presents with  . Dizziness  . Abdominal Pain     Patient is a 32 y.o. female presenting with abdominal pain. The history is provided by the patient and medical records. No language interpreter was used.  Abdominal Pain Associated symptoms include abdominal pain.   HPI Comments: Sally [name] is a 32 y.o. female who presents to the Emergency Department complaining of sudden moderate dizziness onset today while standing in kitchen cooking. Pt states that she felt like she was going to pass out. She states she had lower abdominal cramps that radiate to her back onset 2 days ago and diaphoresis. She reports having diarrhea x1. She states that movement aggravates the back and abdominal pain. She states that she had SOB during onset of dizziness. She states hx of tubal ligation and her LNMP was Nov 20, 2012. She denies alleviating factors.She states she has normal liquid and food intake. Husband states pt normally drinks coffee and 1-2 energy drinks/day. Pt denies LOC, fall, fever, chills, nausea, vomiting, weakness, cough, CP, dysuria, frequency, and any other pain. No one else has been ill.    Pt does not have PCP.   Pt states that smokes pack/day.  Denies drinking alcohol.   History reviewed. No pertinent past medical history.  Past Surgical History  Procedure Laterality Date  . Cesarean section      No family history on file.  History  Substance Use Topics  . Smoking status: Current Every Day Smoker  1 ppd  . Smokeless tobacco: Not on file  . Alcohol Use: No  employed  OB History   Grav Para Term Preterm Abortions TAB SAB Ect Mult Living                  Review of Systems  Constitutional: Positive for  diaphoresis.  Gastrointestinal: Positive for abdominal pain and diarrhea.  Musculoskeletal: Positive for back pain.  Neurological: Positive for dizziness.  All other systems reviewed and are negative.    Allergies  Ibuprofen  Home Medications   Current Outpatient Rx  Name  Route  Sig  Dispense  Refill  . acetaminophen (TYLENOL) 325 MG tablet   Oral   Take 325 mg by mouth as needed.           . promethazine-codeine (PHENERGAN WITH CODEINE) 6.25-10 MG/5ML syrup   Oral   Take 5 mLs by mouth every 4 (four) hours as needed for cough.   120 mL   0     BP 113/62  Pulse 112  Temp(Src) 97.4 F (36.3 C) (Oral)  Resp 20  Ht 5\' 5"  (1.651 m)  Wt 171 lb (77.565 kg)  BMI 28.46 kg/m2  SpO2 99%  LMP 11/20/2012  Vital signs normal tachycardia   Physical Exam  Nursing note and vitals reviewed. Constitutional: She is oriented to person, place, and time. She appears well-developed and well-nourished.  Non-toxic appearance. She does not appear ill. No distress.  HENT:  Head: Normocephalic and atraumatic.  Right Ear: External ear normal.  Left Ear: External ear normal.  Nose: Nose normal. No mucosal edema or rhinorrhea.  Mouth/Throat: No dental abscesses or edematous.  Tongue is dry  Eyes: Conjunctivae and EOM are normal. Pupils are equal, round, and reactive to light.  Neck: Normal range of motion and full passive range of motion without pain. Neck supple.  Cardiovascular: Normal rate, regular rhythm and normal heart sounds.  Exam reveals no gallop and no friction rub.   No murmur heard. Pulmonary/Chest: Effort normal and breath sounds normal. No respiratory distress. She has no wheezes. She has no rhonchi. She has no rales. She exhibits no tenderness and no crepitus.  Abdominal: Soft. Normal appearance and bowel sounds are normal. She exhibits no distension. There is no tenderness. There is no rebound and no guarding.  Musculoskeletal: Normal range of motion. She exhibits no  edema and no tenderness.  Moves all extremities well.   Neurological: She is alert and oriented to person, place, and time. She has normal strength. No cranial nerve deficit.  Skin: Skin is warm, dry and intact. No rash noted. No erythema. No pallor.  Psychiatric: She has a normal mood and affect. Her speech is normal and behavior is normal. Her mood appears not anxious.    ED Course  Procedures (including critical care time)  Medications  metoCLOPramide (REGLAN) injection 10 mg (10 mg Intravenous Given 12/07/12 1957)  diphenhydrAMINE (BENADRYL) injection 25 mg (25 mg Intravenous Given 12/07/12 1957)    DIAGNOSTIC STUDIES: Oxygen Saturation is 99% on room air, normal by my interpretation.    COORDINATION OF CARE: 7:30 PM Discussed ED treatment with pt and pt agrees.   Pt left AMA after getting meds  Results for orders placed during the hospital encounter of 12/07/12  URINALYSIS, ROUTINE W REFLEX MICROSCOPIC      Result Value Range   Color, Urine YELLOW  YELLOW   APPearance CLOUDY (*) CLEAR   Specific Gravity, Urine >1.030 (*) 1.005 - 1.030   pH 6.0  5.0 - 8.0   Glucose, UA NEGATIVE  NEGATIVE mg/dL   Hgb urine dipstick TRACE (*) NEGATIVE   Bilirubin Urine NEGATIVE  NEGATIVE   Ketones, ur 15 (*) NEGATIVE mg/dL   Protein, ur 30 (*) NEGATIVE mg/dL   Urobilinogen, UA 1.0  0.0 - 1.0 mg/dL   Nitrite NEGATIVE  NEGATIVE   Leukocytes, UA SMALL (*) NEGATIVE  CBC WITH DIFFERENTIAL      Result Value Range   WBC 12.8 (*) 4.0 - 10.5 K/uL   RBC 4.24  3.87 - 5.11 MIL/uL   Hemoglobin 13.2  12.0 - 15.0 g/dL   HCT 16.1  09.6 - 04.5 %   MCV 90.3  78.0 - 100.0 fL   MCH 31.1  26.0 - 34.0 pg   MCHC 34.5  30.0 - 36.0 g/dL   RDW 40.9  81.1 - 91.4 %   Platelets 234  150 - 400 K/uL   Neutrophils Relative % 82 (*) 43 - 77 %   Neutro Abs 10.5 (*) 1.7 - 7.7 K/uL   Lymphocytes Relative 15  12 - 46 %   Lymphs Abs 1.9  0.7 - 4.0 K/uL   Monocytes Relative 3  3 - 12 %   Monocytes Absolute 0.4  0.1 -  1.0 K/uL   Eosinophils Relative 0  0 - 5 %   Eosinophils Absolute 0.0  0.0 - 0.7 K/uL   Basophils Relative 0  0 - 1 %   Basophils Absolute 0.0  0.0 - 0.1 K/uL  BASIC METABOLIC PANEL      Result Value Range   Sodium 138  135 - 145 mEq/L   Potassium 3.5  3.5 - 5.1 mEq/L   Chloride 102  96 - 112 mEq/L   CO2 24  19 - 32 mEq/L   Glucose, Bld 161 (*) 70 - 99 mg/dL   BUN 19  6 - 23 mg/dL   Creatinine, Ser 1.91  0.50 - 1.10 mg/dL   Calcium 9.3  8.4 - 47.8 mg/dL   GFR calc non Af Amer >90  >90 mL/min   GFR calc Af Amer >90  >90 mL/min  URINE MICROSCOPIC-ADD ON      Result Value Range   Squamous Epithelial / LPF MANY (*) RARE   WBC, UA 21-50  <3 WBC/hpf   RBC / HPF 0-2  <3 RBC/hpf   Bacteria, UA MANY (*) RARE  POCT PREGNANCY, URINE      Result Value Range   Preg Test, Ur NEGATIVE  NEGATIVE     Laboratory interpretation all normal except leukocytosis, concentrated urine    1. Near syncope   2. Abdominal cramping     Pt left AMA   Devoria Albe, MD, FACEP   MDM    I personally performed the services described in this documentation, which was scribed in my presence. The recorded information has been reviewed and considered.  Devoria Albe, MD, FACEP    Ward Givens, MD 12/08/12 [phone]

## 2012-12-07 NOTE — ED Notes (Signed)
MD at bedside. 

## 2012-12-07 NOTE — ED Notes (Signed)
Pt reports at home cooking and suddenly felt dizzy, diaphoretic, and having severe lower abd radiating through to back.  Pt having chills at this time.  Appears pale, cheeks flushed.  Reports had one episode of diarrhea.   Felt nauseated earlier but not now.

## 2012-12-09 LAB — URINE CULTURE

## 2013-03-15 ENCOUNTER — Ambulatory Visit (INDEPENDENT_AMBULATORY_CARE_PROVIDER_SITE_OTHER): Payer: BC Managed Care – PPO | Admitting: Family Medicine

## 2013-03-15 ENCOUNTER — Encounter: Payer: Self-pay | Admitting: Family Medicine

## 2013-03-15 VITALS — BP 110/78 | HR 88 | Temp 98.3°F | Resp 16 | Wt 167.0 lb

## 2013-03-15 DIAGNOSIS — J329 Chronic sinusitis, unspecified: Secondary | ICD-10-CM

## 2013-03-15 MED ORDER — HYDROCODONE-HOMATROPINE 5-1.5 MG/5ML PO SYRP: 5.0000 mL | ORAL_SOLUTION | Freq: Four times a day (QID) | ORAL | Status: DC | PRN

## 2013-03-15 MED ORDER — AZITHROMYCIN 250 MG PO TABS: ORAL_TABLET | ORAL | Status: DC

## 2013-03-15 NOTE — Progress Notes (Signed)
  Subjective:    Patient ID: Sally [name], female    DOB: 1981-02-22, 32 y.o.   MRN: [id number]  HPI Patient is a pleasant 32 year old Caucasian female who is in today with a one-week history of worsening cough, sinus congestion, sinus pain, sinus pressure, headaches, postnasal drip, and sore throat. She describes the sore throat has a scratchy throat due to postnasal drip. She is also reports hoarseness. The cough is productive of yellow to green sputum. She denies any shortness of breath or chest pain. She does complain of pain in her maxillary and frontal sinuses bilaterally. She reports a fever to 103 at home.   No past medical history on file. No current outpatient prescriptions on file prior to visit.   No current facility-administered medications on file prior to visit.   Allergies  Allergen Reactions  . Ibuprofen Itching and Rash   History   Social History  . Marital Status: Single    Spouse Name: N/A    Number of Children: N/A  . Years of Education: N/A   Occupational History  . Not on file.   Social History Main Topics  . Smoking status: Current Every Day Smoker  . Smokeless tobacco: Not on file  . Alcohol Use: No  . Drug Use: No  . Sexual Activity:    Other Topics Concern  . Not on file   Social History Narrative  . No narrative on file      Review of Systems  All other systems reviewed and are negative.       Objective:   Physical Exam  Vitals reviewed. Constitutional: She appears well-developed and well-nourished.  HENT:  Right Ear: Tympanic membrane, external ear and ear canal normal. Tympanic membrane is not injected, not perforated and not erythematous.  Left Ear: Tympanic membrane, external ear and ear canal normal. Tympanic membrane is not injected, not perforated and not erythematous.  Nose: Mucosal edema and rhinorrhea present. Right sinus exhibits maxillary sinus tenderness and frontal sinus tenderness. Left sinus exhibits maxillary sinus  tenderness and frontal sinus tenderness.  Mouth/Throat: Oropharynx is clear and moist. No oropharyngeal exudate.  Eyes: Right eye exhibits no discharge. Left eye exhibits no discharge.  Neck: Neck supple. No JVD present.  Cardiovascular: Normal rate, regular rhythm and normal heart sounds.   No murmur heard. Pulmonary/Chest: Effort normal and breath sounds normal. No respiratory distress. She has no wheezes. She has no rales. She exhibits no tenderness.  Lymphadenopathy:    She has no cervical adenopathy.          Assessment & Plan:  1. Rhinosinusitis Patient had an upper respiratory infection which has progressed into acute rhinosinusitis. Begin Zithromax 500 mg by mouth daily one, and 250 mg by mouth daily 2 through 5.  I recommended Mucinex and Sudafed for cough and congestion. Also gave patient prescription for Hycodan 1 teaspoon every 6 hours as needed for cough. I warned the patient about sedation on the cough med. - azithromycin (ZITHROMAX) 250 MG tablet; 2 tabs poqday 1, 1 tab poqday 2-5  Dispense: 6 tablet; Refill: 0 - HYDROcodone-homatropine (HYCODAN) 5-1.5 MG/5ML syrup; Take 5 mLs by mouth every 6 (six) hours as needed for cough.  Dispense: 120 mL; Refill: 0

## 2013-03-29 ENCOUNTER — Encounter: Payer: Self-pay | Admitting: Family Medicine

## 2013-03-29 ENCOUNTER — Ambulatory Visit (INDEPENDENT_AMBULATORY_CARE_PROVIDER_SITE_OTHER): Payer: BC Managed Care – PPO | Admitting: Family Medicine

## 2013-03-29 VITALS — BP 120/70 | HR 76 | Temp 97.3°F | Resp 18 | Wt 165.0 lb

## 2013-03-29 DIAGNOSIS — N63 Unspecified lump in unspecified breast: Secondary | ICD-10-CM

## 2013-03-29 DIAGNOSIS — N6310 Unspecified lump in the right breast, unspecified quadrant: Secondary | ICD-10-CM | POA: Insufficient documentation

## 2013-03-29 MED ORDER — HYDROCODONE-ACETAMINOPHEN 5-325 MG PO TABS: 1.0000 | ORAL_TABLET | Freq: Four times a day (QID) | ORAL | Status: DC | PRN

## 2013-03-29 NOTE — Patient Instructions (Signed)
Mammogram and ultrasound to be set up Take pain medication as needed Use heating pad to see if this help with the pain We will f/u by phone with results

## 2013-03-29 NOTE — Assessment & Plan Note (Signed)
Given short course of hydrocodone, severely tender on exam, no signs of infection Obtain US and diagnostic mammogram right breast

## 2013-03-29 NOTE — Progress Notes (Signed)
  Subjective:    Patient ID: Sally [name], female    DOB: [birth year], 32 y.o.   MRN: [id number]  HPI  Right breast lump noticed 1 week ago. No family or personal history of breast cancer. Lump very tender to touch, denies redness or drainage. Has been taking aleve multiple times a day with no relief in pain. Denies change in caffeine intake, LMP  3weeks ago, does not get breast tenderness of lumps during her cycle.    Review of Systems  - per above  GEN- denies fatigue, fever, weight loss,weakness, recent illness CVS- denies chest pain, palpitations RESP- denies SOB, cough, wheeze          Objective:   Physical Exam GEN- NAD, alert and oriented x 3 Breast- normal symmetry, no nipple inversion,no nipple drainage,+ 1cm mass palpated between 10 and 11 oclock position right breast, TTP,  Nodes- no axillary nodes Skin- no erythema, no lesions noted on breast     Assessment & Plan:

## 2013-03-30 ENCOUNTER — Other Ambulatory Visit: Payer: Self-pay | Admitting: Family Medicine

## 2013-03-30 DIAGNOSIS — N63 Unspecified lump in unspecified breast: Secondary | ICD-10-CM

## 2013-04-01 ENCOUNTER — Encounter (HOSPITAL_COMMUNITY): Payer: Self-pay

## 2013-04-01 ENCOUNTER — Emergency Department (HOSPITAL_COMMUNITY)
Admission: EM | Admit: 2013-04-01 | Discharge: 2013-04-01 | Disposition: A | Discharge status: Home / Self Care | Payer: BC Managed Care – PPO | Attending: Emergency Medicine | Admitting: Emergency Medicine

## 2013-04-01 DIAGNOSIS — N63 Unspecified lump in unspecified breast: Secondary | ICD-10-CM

## 2013-04-01 DIAGNOSIS — Z79899 Other long term (current) drug therapy: Secondary | ICD-10-CM | POA: Insufficient documentation

## 2013-04-01 DIAGNOSIS — F17200 Nicotine dependence, unspecified, uncomplicated: Secondary | ICD-10-CM | POA: Insufficient documentation

## 2013-04-01 MED ORDER — TRAMADOL HCL 50 MG PO TABS
50.0000 mg | ORAL_TABLET | Freq: Once | ORAL | Status: AC
  Administered 2013-04-01: 50 mg via ORAL
  Filled 2013-04-01: qty 1

## 2013-04-01 MED ORDER — LIDOCAINE-EPINEPHRINE 2 %-1:100000 IJ SOLN: 10.0000 mL | Freq: Once | INTRAMUSCULAR | Status: DC

## 2013-04-01 MED ORDER — CLINDAMYCIN HCL 150 MG PO CAPS: 300.0000 mg | ORAL_CAPSULE | Freq: Once | ORAL | Status: DC

## 2013-04-01 MED ORDER — OXYCODONE-ACETAMINOPHEN 5-325 MG PO TABS: 2.0000 | ORAL_TABLET | Freq: Once | ORAL | Status: DC

## 2013-04-01 MED ORDER — TRAMADOL HCL 50 MG PO TABS: 50.0000 mg | ORAL_TABLET | Freq: Four times a day (QID) | ORAL | Status: DC | PRN

## 2013-04-01 NOTE — ED Notes (Signed)
Pt states she has a knot to right breast x 2 weeks, is scheduled for mammogram on Oct 8th but is having increased sharp pain.

## 2013-04-01 NOTE — ED Notes (Signed)
Electronic signature pad not working - pt acknowledged dc instructions.

## 2013-04-01 NOTE — ED Provider Notes (Signed)
CSN: [id number]     Arrival date & time 04/01/13  0147 History   First MD Initiated Contact with Patient 04/01/13 [phone]     Chief Complaint  Patient presents with  . knot in breast    (Consider location/radiation/quality/duration/timing/severity/associated sxs/prior Treatment) HPI  32yF presenting "because my titty hurts." Patient first noticed a painful mass in her right breast approximately 2 weeks ago. She was evaluated by her primary care provider for the same. She has a mammogram scheduled for October 8. She finds the emergency room she reports that it is slightly increased in size and become more painful. No fevers or chills. No drainage. No nipple discharge. No history of previous breast mass. Otherwise healthy. Never had a mammogram before.  History reviewed. No pertinent past medical history. Past Surgical History  Procedure Laterality Date  . Cesarean section     No family history on file. History  Substance Use Topics  . Smoking status: Current Every Day Smoker  . Smokeless tobacco: Not on file  . Alcohol Use: No   OB History   Grav Para Term Preterm Abortions TAB SAB Ect Mult Living                 Review of Systems  All systems reviewed and negative, other than as noted in HPI.   Allergies  Ibuprofen  Home Medications   Current Outpatient Rx  Name  Route  Sig  Dispense  Refill  . Ascorbic Acid (VITAMIN C) 1000 MG tablet   Oral   Take 1,000 mg by mouth daily.         Marland Kitchen HYDROcodone-acetaminophen (NORCO) 5-325 MG per tablet   Oral   Take 1 tablet by mouth every 6 (six) hours as needed for pain.   30 tablet   0   . Multiple Vitamin (MULTIVITAMIN) capsule   Oral   Take 1 capsule by mouth daily.         . vitamin B-12 (CYANOCOBALAMIN) 1000 MCG tablet   Oral   Take 1,000 mcg by mouth daily.         . traMADol (ULTRAM) 50 MG tablet   Oral   Take 1 tablet (50 mg total) by mouth every 6 (six) hours as needed for pain.   20 tablet   0    BP  101/60  Pulse 65  Temp(Src) 98.1 F (36.7 C) (Oral)  Resp 16  Ht 5\' 5"  (1.651 m)  Wt 165 lb (74.844 kg)  BMI 27.46 kg/m2  SpO2 97%  LMP 02/22/2013 Physical Exam  Nursing note and vitals reviewed. Constitutional: She appears well-developed and well-nourished. No distress.  HENT:  Head: Normocephalic and atraumatic.  Eyes: Conjunctivae are normal. Right eye exhibits no discharge. Left eye exhibits no discharge.  Neck: Neck supple.  Cardiovascular: Normal rate, regular rhythm and normal heart sounds.  Exam reveals no gallop and no friction rub.   No murmur heard. Pulmonary/Chest: Effort normal and breath sounds normal. No respiratory distress.  Tender, firm ropelike mass deep within the upper outer quadrant of the right breast. No overlying skin changes. There is no nipple retraction. No drainage. No axillary adenopathy.  Abdominal: Soft. She exhibits no distension. There is no tenderness.  Musculoskeletal: She exhibits no edema and no tenderness.  Neurological: She is alert.  Skin: Skin is warm and dry.  Psychiatric: She has a normal mood and affect. Her behavior is normal. Thought content normal.    ED Course  Procedures (including critical care  time) Labs Review Labs Reviewed - No data to display Imaging Review No results found.  MDM   1. Breast mass in female    32 year old female with a right breast mass. Suspect fibrocystic changes with ropelike consistency. No evidence of abscess. Patient recently evaluated for the same. She is scheduled for a mammogram in October 8. Will treat her pain. Return precautions discussed. Outpatient followup otherwise. Encouraged to quit smoking.    Raeford Razor, MD 04/08/13 1800

## 2013-04-06 ENCOUNTER — Ambulatory Visit (HOSPITAL_COMMUNITY)
Admission: RE | Admit: 2013-04-06 | Discharge: 2013-04-06 | Disposition: A | Discharge status: Home / Self Care | Payer: BC Managed Care – PPO | Source: Ambulatory Visit | Attending: Family Medicine | Admitting: Family Medicine

## 2013-04-06 ENCOUNTER — Telehealth: Payer: Self-pay | Admitting: Family Medicine

## 2013-04-06 ENCOUNTER — Other Ambulatory Visit: Payer: Self-pay | Admitting: Family Medicine

## 2013-04-06 ENCOUNTER — Other Ambulatory Visit (HOSPITAL_COMMUNITY): Payer: Self-pay | Admitting: Family Medicine

## 2013-04-06 DIAGNOSIS — N63 Unspecified lump in unspecified breast: Secondary | ICD-10-CM | POA: Insufficient documentation

## 2013-04-06 MED ORDER — DICLOFENAC SODIUM 75 MG PO TBEC: 75.0000 mg | DELAYED_RELEASE_TABLET | Freq: Two times a day (BID) | ORAL | Status: DC

## 2013-04-06 NOTE — Telephone Encounter (Signed)
Pt called stating that she is going to have her mammogram today at 11:30 - (AP had cancellation and worked her in early) - she states that she is still having a lot of pain and she only has 2 pain pills left which are not really helping the pain that much if at all and would like to know if she can have something else for her pain as she has to work 12 hours shifts?

## 2013-04-06 NOTE — Telephone Encounter (Signed)
Patient aware.

## 2013-04-06 NOTE — Telephone Encounter (Signed)
Please let pt know her Mammogram and US show no breast mass, normal breast tissue of no concerns For the pain- She can take Diclofenac 1 tablet twice a day, this is for inflammation associated with fibrocystic breast very common in her age No further narcotics are needed I would advise - no caffiene as well

## 2013-04-06 NOTE — Telephone Encounter (Signed)
LMTRC

## 2013-04-13 ENCOUNTER — Encounter (HOSPITAL_COMMUNITY): Payer: Self-pay

## 2013-10-30 ENCOUNTER — Encounter (HOSPITAL_COMMUNITY): Payer: Self-pay | Admitting: Emergency Medicine

## 2013-10-30 ENCOUNTER — Emergency Department (HOSPITAL_COMMUNITY)
Admission: EM | Admit: 2013-10-30 | Discharge: 2013-10-30 | Disposition: A | Discharge status: Home / Self Care | Payer: BC Managed Care – PPO | Attending: Emergency Medicine | Admitting: Emergency Medicine

## 2013-10-30 DIAGNOSIS — Z9851 Tubal ligation status: Secondary | ICD-10-CM | POA: Insufficient documentation

## 2013-10-30 DIAGNOSIS — N949 Unspecified condition associated with female genital organs and menstrual cycle: Secondary | ICD-10-CM | POA: Insufficient documentation

## 2013-10-30 DIAGNOSIS — Z3202 Encounter for pregnancy test, result negative: Secondary | ICD-10-CM | POA: Insufficient documentation

## 2013-10-30 DIAGNOSIS — Z79899 Other long term (current) drug therapy: Secondary | ICD-10-CM | POA: Insufficient documentation

## 2013-10-30 DIAGNOSIS — Z791 Long term (current) use of non-steroidal anti-inflammatories (NSAID): Secondary | ICD-10-CM | POA: Insufficient documentation

## 2013-10-30 DIAGNOSIS — F17200 Nicotine dependence, unspecified, uncomplicated: Secondary | ICD-10-CM | POA: Insufficient documentation

## 2013-10-30 DIAGNOSIS — N938 Other specified abnormal uterine and vaginal bleeding: Secondary | ICD-10-CM | POA: Insufficient documentation

## 2013-10-30 LAB — CBC WITH DIFFERENTIAL/PLATELET
BASOS ABS: 0 10*3/uL (ref 0.0–0.1)
Basophils Relative: 0 % (ref 0–1)
EOS ABS: 0.2 10*3/uL (ref 0.0–0.7)
EOS PCT: 2 % (ref 0–5)
HCT: 37.2 % (ref 36.0–46.0)
Hemoglobin: 13.1 g/dL (ref 12.0–15.0)
Lymphocytes Relative: 45 % (ref 12–46)
Lymphs Abs: 3.9 10*3/uL (ref 0.7–4.0)
MCH: 31.1 pg (ref 26.0–34.0)
MCHC: 35.2 g/dL (ref 30.0–36.0)
MCV: 88.4 fL (ref 78.0–100.0)
Monocytes Absolute: 0.8 10*3/uL (ref 0.1–1.0)
Monocytes Relative: 9 % (ref 3–12)
Neutro Abs: 3.8 10*3/uL (ref 1.7–7.7)
Neutrophils Relative %: 44 % (ref 43–77)
PLATELETS: 290 10*3/uL (ref 150–400)
RBC: 4.21 MIL/uL (ref 3.87–5.11)
RDW: 12.4 % (ref 11.5–15.5)
WBC: 8.7 10*3/uL (ref 4.0–10.5)

## 2013-10-30 LAB — HIV ANTIBODY (ROUTINE TESTING W REFLEX): HIV: NONREACTIVE

## 2013-10-30 LAB — WET PREP, GENITAL
Trich, Wet Prep: NONE SEEN
Yeast Wet Prep HPF POC: NONE SEEN

## 2013-10-30 LAB — POC URINE PREG, ED: PREG TEST UR: NEGATIVE

## 2013-10-30 LAB — RPR

## 2013-10-30 MED ORDER — OXYCODONE-ACETAMINOPHEN 5-325 MG PO TABS: 1.0000 | ORAL_TABLET | ORAL | Status: DC | PRN

## 2013-10-30 MED ORDER — MEDROXYPROGESTERONE ACETATE 10 MG PO TABS: 10.0000 mg | ORAL_TABLET | Freq: Every day | ORAL | Status: DC

## 2013-10-30 MED ORDER — MEDROXYPROGESTERONE ACETATE 10 MG PO TABS
10.0000 mg | ORAL_TABLET | Freq: Every day | ORAL | Status: DC
  Administered 2013-10-30: 10 mg via ORAL
  Filled 2013-10-30 (×2): qty 1

## 2013-10-30 MED ORDER — MEDROXYPROGESTERONE ACETATE 10 MG PO TABS
ORAL_TABLET | ORAL | Status: AC
  Filled 2013-10-30: qty 1

## 2013-10-30 MED ORDER — DIPHENHYDRAMINE HCL 50 MG/ML IJ SOLN
25.0000 mg | Freq: Once | INTRAMUSCULAR | Status: AC
  Administered 2013-10-30: 25 mg via INTRAVENOUS

## 2013-10-30 MED ORDER — SODIUM CHLORIDE 0.9 % IV SOLN
1000.0000 mL | Freq: Once | INTRAVENOUS | Status: AC
Start: 2013-10-30 — End: 2013-10-30
  Administered 2013-10-30: 1000 mL via INTRAVENOUS

## 2013-10-30 MED ORDER — SODIUM CHLORIDE 0.9 % IV SOLN: 1000.0000 mL | INTRAVENOUS | Status: DC

## 2013-10-30 MED ORDER — MORPHINE SULFATE 4 MG/ML IJ SOLN
4.0000 mg | Freq: Once | INTRAMUSCULAR | Status: AC
  Administered 2013-10-30: 4 mg via INTRAVENOUS
  Filled 2013-10-30: qty 1

## 2013-10-30 MED ORDER — DIPHENHYDRAMINE HCL 50 MG/ML IJ SOLN
INTRAMUSCULAR | Status: AC
  Filled 2013-10-30: qty 1

## 2013-10-30 NOTE — ED Provider Notes (Signed)
CSN: [id number]     Arrival date & time 10/30/13  0341 History   First MD Initiated Contact with Patient 10/30/13 [phone]     Chief Complaint  Patient presents with  . Vaginal Bleeding     (Consider location/radiation/quality/duration/timing/severity/associated sxs/prior Treatment) Patient is a 33 y.o. female presenting with vaginal bleeding. The history is provided by the patient.  Vaginal Bleeding She started spotting 3 days ago and bleeding has gotten progressively worse since then. She's now passing large amount of blood with clots and going through, one pad per hour. Normally, that lasts for 4-5 hours at 2 height of her menses. Last menstrual period prior to this was 2 months ago. It is unusual for her to go that long between menses. There is abdominal cramping which she rates it 10/10. There was nausea last night but no nausea today. She's not noticed any breast swelling or tenderness. There's been no fever or chills. She is status post tubal ligation.  History reviewed. No pertinent past medical history. Past Surgical History  Procedure Laterality Date  . Cesarean section     No family history on file. History  Substance Use Topics  . Smoking status: Current Every Day Smoker  . Smokeless tobacco: Not on file  . Alcohol Use: Yes   OB History   Grav Para Term Preterm Abortions TAB SAB Ect Mult Living                 Review of Systems  Genitourinary: Positive for vaginal bleeding.  All other systems reviewed and are negative.     Allergies  Ibuprofen  Home Medications   Prior to Admission medications   Medication Sig Start Date End Date Taking? Authorizing Provider  Ascorbic Acid (VITAMIN C) 1000 MG tablet Take 1,000 mg by mouth daily.   Yes Historical Provider, MD  Multiple Vitamin (MULTIVITAMIN) capsule Take 1 capsule by mouth daily.   Yes Historical Provider, MD  vitamin B-12 (CYANOCOBALAMIN) 1000 MCG tablet Take 1,000 mcg by mouth daily.   Yes Historical Provider,  MD  diclofenac (VOLTAREN) 75 MG EC tablet Take 1 tablet (75 mg total) by mouth 2 (two) times daily. 04/06/13   Alycia Rossetti, MD  HYDROcodone-acetaminophen (NORCO) 5-325 MG per tablet Take 1 tablet by mouth every 6 (six) hours as needed for pain. 03/29/13   Alycia Rossetti, MD  traMADol (ULTRAM) 50 MG tablet Take 1 tablet (50 mg total) by mouth every 6 (six) hours as needed for pain. 04/01/13   Virgel Manifold, MD   BP 137/82  Pulse 68  Temp(Src) 97.9 F (36.6 C) (Oral)  Resp 20  Ht 5\' 5"  (1.651 m)  Wt 168 lb (76.204 kg)  BMI 27.96 kg/m2  SpO2 99%  LMP 10/27/2013 Physical Exam  Nursing note and vitals reviewed.  33 year old female, resting comfortably and in no acute distress. Vital signs are normal. Oxygen saturation is 99%, which is normal. Head is normocephalic and atraumatic. PERRLA, EOMI. Oropharynx is clear. Neck is nontender and supple without adenopathy or JVD. Back is nontender and there is no CVA tenderness. Lungs are clear without rales, wheezes, or rhonchi. Chest is nontender. Heart has regular rate and rhythm without murmur. Abdomen is soft, flat, with mild suprapubic tenderness. There is no rebound or guarding. There are no masses or hepatosplenomegaly and peristalsis is normoactive. Extremities have no cyanosis or edema, full range of motion is present. Skin is warm and dry without rash. Neurologic: Mental status is normal, cranial  nerves are intact, there are no motor or sensory deficits.  ED Course  Procedures (including critical care time) Labs Review Results for orders placed during the hospital encounter of 10/30/13  WET PREP, GENITAL      Result Value Ref Range   Yeast Wet Prep HPF POC NONE SEEN  NONE SEEN   Trich, Wet Prep NONE SEEN  NONE SEEN   Clue Cells Wet Prep HPF POC FEW (*) NONE SEEN   WBC, Wet Prep HPF POC FEW (*) NONE SEEN  CBC WITH DIFFERENTIAL      Result Value Ref Range   WBC 8.7  4.0 - 10.5 K/uL   RBC 4.21  3.87 - 5.11 MIL/uL   Hemoglobin  13.1  12.0 - 15.0 g/dL   HCT 37.2  36.0 - 46.0 %   MCV 88.4  78.0 - 100.0 fL   MCH 31.1  26.0 - 34.0 pg   MCHC 35.2  30.0 - 36.0 g/dL   RDW 12.4  11.5 - 15.5 %   Platelets 290  150 - 400 K/uL   Neutrophils Relative % 44  43 - 77 %   Neutro Abs 3.8  1.7 - 7.7 K/uL   Lymphocytes Relative 45  12 - 46 %   Lymphs Abs 3.9  0.7 - 4.0 K/uL   Monocytes Relative 9  3 - 12 %   Monocytes Absolute 0.8  0.1 - 1.0 K/uL   Eosinophils Relative 2  0 - 5 %   Eosinophils Absolute 0.2  0.0 - 0.7 K/uL   Basophils Relative 0  0 - 1 %   Basophils Absolute 0.0  0.0 - 0.1 K/uL  POC URINE PREG, ED      Result Value Ref Range   Preg Test, Ur NEGATIVE  NEGATIVE   MDM   Final diagnoses:  Dysfunctional uterine bleeding    Dysfunctional uterine bleeding. Pregnancy test to be obtained to rule out ectopic pregnancy and pelvic exam will need to be done. She's given morphine for pain.  Pelvic exam shows normal external female genitalia. There is a small amount of blood in the vaginal vault without any clots and no active bleeding. Cervix is closed. There no adnexal masses or tenderness. Fundus is normal size and position without tenderness and there is no cervical motion tenderness. Pregnancy test is negative. She is given a dose of medroxyprogesterone and will be sent home with a five-day prescription of same. She is also given a prescription for oxycodone-acetaminophen.  Delora Fuel, MD 37/62/83 [id number]

## 2013-10-30 NOTE — ED Notes (Signed)
Patient states she started her period last Thursday; states she hasn't had a period for 2 months.  Patient states she has been passing clots and has pain in lower back that radiates to front.  Patient states she had tubes tied 9 years ago.

## 2013-10-30 NOTE — Discharge Instructions (Signed)
Abnormal Uterine Bleeding Abnormal uterine bleeding can affect women at various stages in life, including teenagers, women in their reproductive years, pregnant women, and women who have reached menopause. Several kinds of uterine bleeding are considered abnormal, including:  Bleeding or spotting between periods.   Bleeding after sexual intercourse.   Bleeding that is heavier or more than normal.   Periods that last longer than usual.  Bleeding after menopause.  Many cases of abnormal uterine bleeding are minor and simple to treat, while others are more serious. Any type of abnormal bleeding should be evaluated by your health care provider. Treatment will depend on the cause of the bleeding. HOME CARE INSTRUCTIONS Monitor your condition for any changes. The following actions may help to alleviate any discomfort you are experiencing:  Avoid the use of tampons and douches as directed by your health care provider.  Change your pads frequently. You should get regular pelvic exams and Pap tests. Keep all follow-up appointments for diagnostic tests as directed by your health care provider.  SEEK MEDICAL CARE IF:   Your bleeding lasts more than 1 week.   You feel dizzy at times.  SEEK IMMEDIATE MEDICAL CARE IF:   You pass out.   You are changing pads every 15 to 30 minutes.   You have abdominal pain.  You have a fever.   You become sweaty or weak.   You are passing large blood clots from the vagina.   You start to feel nauseous and vomit. MAKE SURE YOU:   Understand these instructions.  Will watch your condition.  Will get help right away if you are not doing well or get worse. Document Released: 06/23/2005 Document Revised: 02/23/2013 Document Reviewed: 01/20/2013 [facility] Patient Information 2014 [place], [place].  Medroxyprogesterone tablets What is this medicine? MEDROXYPROGESTERONE (me DROX ee proe JES te rone) is a hormone in a class called progestins. It  is commonly used to prevent the uterine lining from overgrowth in women taking an estrogen after menopause. It is also used to treat irregular menstrual bleeding or a lack of menstrual bleeding in women. This medicine may be used for other purposes; ask your health care provider or pharmacist if you have questions. COMMON BRAND NAME(S): Amen, Provera What should I tell my health care provider before I take this medicine? They need to know if you have any of these conditions: -blood vessel disease or a history of a blood clot in the lungs or legs -breast, cervical or vaginal cancer -heart disease -kidney disease -liver disease -migraine -recent miscarriage or abortion -mental depression -migraine -seizures (convulsions) -stroke -vaginal bleeding that has not been evaluated -an unusual or allergic reaction to medroxyprogesterone, other medicines, foods, dyes, or preservatives -pregnant or trying to get pregnant -breast-feeding How should I use this medicine? Take this medicine by mouth with a glass of water. Follow the directions on the prescription label. Take your doses at regular intervals. Do not take your medicine more often than directed. Talk to your pediatrician regarding the use of this medicine in children. Special care may be needed. While this drug may be prescribed for children as young as 13 years for selected conditions, precautions do apply. Overdosage: If you think you have taken too much of this medicine contact a poison control center or emergency room at once. NOTE: This medicine is only for you. Do not share this medicine with others. What if I miss a dose? If you miss a dose, take it as soon as you can. If  it is almost time for your next dose, take only that dose. Do not take double or extra doses. What may interact with this medicine? -barbiturate medicines for inducing sleep or treating seizures (convulsions) -bosentan -carbamazepine -phenytoin -rifampin -St.  John's Wort This list may not describe all possible interactions. Give your health care provider a list of all the medicines, herbs, non-prescription drugs, or dietary supplements you use. Also tell them if you smoke, drink alcohol, or use illegal drugs. Some items may interact with your medicine. What should I watch for while using this medicine? Visit your health care professional for regular checks on your progress. You will need a regular breast and pelvic exam. If you have any reason to think you are pregnant, stop taking this medicine at once and contact your doctor or health care professional. What side effects may I notice from receiving this medicine? Side effects that you should report to your doctor or health care professional as soon as possible: -breast tenderness or discharge -changes in mood or emotions, such as depression -changes in vision or speech -pain in the abdomen, chest, groin, or leg -severe headache -skin rash, itching, or hives -sudden shortness of breath -unusually weak or tired -yellowing of skin or eyes Side effects that usually do not require medical attention (report to your doctor or health care professional if they continue or are bothersome): -acne -change in menstrual bleeding pattern or flow -changes in sexual desire -facial hair growth -fluid retention and swelling -headache -upset stomach -weight gain or loss This list may not describe all possible side effects. Call your doctor for medical advice about side effects. You may report side effects to FDA at 1-800-FDA-1088. Where should I keep my medicine? Keep out of the reach of children. Store at room temperature between 20 and 25 degrees C (68 and 77 degrees F). Throw away any unused medicine after the expiration date. NOTE: This sheet is a summary. It may not cover all possible information. If you have questions about this medicine, talk to your doctor, pharmacist, or health care provider.  2014,  Elsevier/Gold Standard. (2008-06-22 11:26:12)  Acetaminophen; Oxycodone tablets What is this medicine? ACETAMINOPHEN; OXYCODONE (a set a MEE noe fen; ox i KOE done) is a pain reliever. It is used to treat mild to moderate pain. This medicine may be used for other purposes; ask your health care provider or pharmacist if you have questions. COMMON BRAND NAME(S): Endocet, Magnacet, Narvox, Percocet, Perloxx, Primalev, Primlev, Roxicet, Xolox What should I tell my health care provider before I take this medicine? They need to know if you have any of these conditions: -brain tumor -Crohn's disease, inflammatory bowel disease, or ulcerative colitis -drug abuse or addiction -head injury -heart or circulation problems -if you often drink alcohol -kidney disease or problems going to the bathroom -liver disease -lung disease, asthma, or breathing problems -an unusual or allergic reaction to acetaminophen, oxycodone, other opioid analgesics, other medicines, foods, dyes, or preservatives -pregnant or trying to get pregnant -breast-feeding How should I use this medicine? Take this medicine by mouth with a full glass of water. Follow the directions on the prescription label. Take your medicine at regular intervals. Do not take your medicine more often than directed. Talk to your pediatrician regarding the use of this medicine in children. Special care may be needed. Patients over 74 years old may have a stronger reaction and need a smaller dose. Overdosage: If you think you have taken too much of this medicine contact a  poison control center or emergency room at once. NOTE: This medicine is only for you. Do not share this medicine with others. What if I miss a dose? If you miss a dose, take it as soon as you can. If it is almost time for your next dose, take only that dose. Do not take double or extra doses. What may interact with this medicine? -alcohol -antihistamines -barbiturates like  amobarbital, butalbital, butabarbital, methohexital, pentobarbital, phenobarbital, thiopental, and secobarbital -benztropine -drugs for bladder problems like solifenacin, trospium, oxybutynin, tolterodine, hyoscyamine, and methscopolamine -drugs for breathing problems like ipratropium and tiotropium -drugs for certain stomach or intestine problems like propantheline, homatropine methylbromide, glycopyrrolate, atropine, belladonna, and dicyclomine -general anesthetics like etomidate, ketamine, nitrous oxide, propofol, desflurane, enflurane, halothane, isoflurane, and sevoflurane -medicines for depression, anxiety, or psychotic disturbances -medicines for sleep -muscle relaxants -naltrexone -narcotic medicines (opiates) for pain -phenothiazines like perphenazine, thioridazine, chlorpromazine, mesoridazine, fluphenazine, prochlorperazine, promazine, and trifluoperazine -scopolamine -tramadol -trihexyphenidyl This list may not describe all possible interactions. Give your health care provider a list of all the medicines, herbs, non-prescription drugs, or dietary supplements you use. Also tell them if you smoke, drink alcohol, or use illegal drugs. Some items may interact with your medicine. What should I watch for while using this medicine? Tell your doctor or health care professional if your pain does not go away, if it gets worse, or if you have new or a different type of pain. You may develop tolerance to the medicine. Tolerance means that you will need a higher dose of the medication for pain relief. Tolerance is normal and is expected if you take this medicine for a long time. Do not suddenly stop taking your medicine because you may develop a severe reaction. Your body becomes used to the medicine. This does NOT mean you are addicted. Addiction is a behavior related to getting and using a drug for a non-medical reason. If you have pain, you have a medical reason to take pain medicine. Your doctor  will tell you how much medicine to take. If your doctor wants you to stop the medicine, the dose will be slowly lowered over time to avoid any side effects. You may get drowsy or dizzy. Do not drive, use machinery, or do anything that needs mental alertness until you know how this medicine affects you. Do not stand or sit up quickly, especially if you are an older patient. This reduces the risk of dizzy or fainting spells. Alcohol may interfere with the effect of this medicine. Avoid alcoholic drinks. There are different types of narcotic medicines (opiates) for pain. If you take more than one type at the same time, you may have more side effects. Give your health care provider a list of all medicines you use. Your doctor will tell you how much medicine to take. Do not take more medicine than directed. Call emergency for help if you have problems breathing. The medicine will cause constipation. Try to have a bowel movement at least every 2 to 3 days. If you do not have a bowel movement for 3 days, call your doctor or health care professional. Do not take Tylenol (acetaminophen) or medicines that have acetaminophen with this medicine. Too much acetaminophen can be very dangerous. Many nonprescription medicines contain acetaminophen. Always read the labels carefully to avoid taking more acetaminophen. What side effects may I notice from receiving this medicine? Side effects that you should report to your doctor or health care professional as soon as possible: -allergic reactions like  skin rash, itching or hives, swelling of the face, lips, or tongue -breathing difficulties, wheezing -confusion -light headedness or fainting spells -severe stomach pain -unusually weak or tired -yellowing of the skin or the whites of the eyes  Side effects that usually do not require medical attention (report to your doctor or health care professional if they continue or are  bothersome): -dizziness -drowsiness -nausea -vomiting This list may not describe all possible side effects. Call your doctor for medical advice about side effects. You may report side effects to FDA at 1-800-FDA-1088. Where should I keep my medicine? Keep out of the reach of children. This medicine can be abused. Keep your medicine in a safe place to protect it from theft. Do not share this medicine with anyone. Selling or giving away this medicine is dangerous and against the law. Store at room temperature between 20 and 25 degrees C (68 and 77 degrees F). Keep container tightly closed. Protect from light. This medicine may cause accidental overdose and death if it is taken by other adults, children, or pets. Flush any unused medicine down the toilet to reduce the chance of harm. Do not use the medicine after the expiration date. NOTE: This sheet is a summary. It may not cover all possible information. If you have questions about this medicine, talk to your doctor, pharmacist, or health care provider.  2014, Elsevier/Gold Standard. (2013-02-14 13:17:35)

## 2013-10-31 LAB — GC/CHLAMYDIA PROBE AMP
CT Probe RNA: NEGATIVE
GC PROBE AMP APTIMA: NEGATIVE

## 2013-10-31 MED FILL — Oxycodone w/ Acetaminophen Tab 5-325 MG: ORAL | Qty: 6 | Status: AC

## 2014-01-27 ENCOUNTER — Encounter: Payer: Self-pay | Admitting: Family Medicine

## 2014-01-27 ENCOUNTER — Ambulatory Visit (INDEPENDENT_AMBULATORY_CARE_PROVIDER_SITE_OTHER): Payer: BC Managed Care – PPO | Admitting: Family Medicine

## 2014-01-27 VITALS — BP 116/62 | HR 64 | Temp 98.0°F | Resp 14 | Ht 64.0 in | Wt 167.0 lb

## 2014-01-27 DIAGNOSIS — G43011 Migraine without aura, intractable, with status migrainosus: Secondary | ICD-10-CM

## 2014-01-27 MED ORDER — HYDROCODONE-ACETAMINOPHEN 5-325 MG PO TABS: 1.0000 | ORAL_TABLET | Freq: Four times a day (QID) | ORAL | Status: DC | PRN

## 2014-01-27 MED ORDER — SUMATRIPTAN SUCCINATE 100 MG PO TABS: 100.0000 mg | ORAL_TABLET | ORAL | Status: DC | PRN

## 2014-01-27 MED ORDER — PROMETHAZINE HCL 25 MG PO TABS: 25.0000 mg | ORAL_TABLET | Freq: Three times a day (TID) | ORAL | Status: DC | PRN

## 2014-01-27 NOTE — Progress Notes (Signed)
Patient ID: Sally [name], female   DOB: [birth year], 33 y.o.   MRN: [id number]   Subjective:    Patient ID: Sally [name], female    DOB: May 14, 1981, 33 y.o.   MRN: [id number]  Patient presents for Migraine  patient here with migraine headache for the past 3 days. She does have history of migraines she typically gets a couple a month. She usually takes Tylenol and tries to rest and ease it off. She does have a family history of migraines. She's not associated as well as photophobia and phonophobia the pain is located on her temples. She has been taking Tylenol this week without any improvement. She did miss a day and a half of work.    Review Of Systems:  GEN- denies fatigue, fever, weight loss,weakness, recent illness HEENT- denies eye drainage, change in vision, nasal discharge, CVS- denies chest pain, palpitations RESP- denies SOB, cough, wheeze ABD- + N/ no V, change in stools, abd pain Neuro- + headache, dizziness, syncope, seizure activity       Objective:    BP 116/62  Pulse 64  Temp(Src) 98 F (36.7 C) (Oral)  Resp 14  Ht 5\' 4"  (1.626 m)  Wt 167 lb (75.751 kg)  BMI 28.65 kg/m2 GEN- NAD, alert and oriented x3,sitting in dark room with sunglasses HEENT- PERRL, EOMI, non injected sclera, fundus benign pink conjunctiva, MMM, oropharynx clear,. No maxillary sinus tenderness Neck- Supple,  CVS- RRR, no murmur RESP-CTAB Neuro- CNII-XII in tact, no focal deficits        Assessment & Plan:      Problem List Items Addressed This Visit   None    Visit Diagnoses   Intractable migraine without aura and with status migrainosus    -  Primary    Allergy to NSAIDS and she has no driver for herself, Given Hydocodone, phenergan push fluids. Imitrex to be used if she has another headache, if they become more frequent she will need prophylactic medication    Relevant Medications       HYDROcodone-acetaminophen (NORCO) 5-325 MG per tablet       SUMAtriptan (IMITREX)  tablet       Note: This dictation was prepared with Dragon dictation along with smaller phrase technology. Any transcriptional errors that result from this process are unintentional.

## 2014-01-27 NOTE — Patient Instructions (Addendum)
Phenergan for headache /nausea Imitrex Hydrocodone for migraine F/U as needed

## 2014-02-03 ENCOUNTER — Telehealth: Payer: Self-pay | Admitting: *Deleted

## 2014-02-03 NOTE — Telephone Encounter (Signed)
Received fax from pharmacy requesting PA on Imitrex.   PA submitted.

## 2014-02-06 NOTE — Telephone Encounter (Signed)
Received PA determination.   PA approved.  

## 2014-02-18 ENCOUNTER — Emergency Department (HOSPITAL_COMMUNITY)
Admission: EM | Admit: 2014-02-18 | Discharge: 2014-02-19 | Disposition: A | Discharge status: Home / Self Care | Payer: BC Managed Care – PPO | Attending: Emergency Medicine | Admitting: Emergency Medicine

## 2014-02-18 ENCOUNTER — Encounter (HOSPITAL_COMMUNITY): Payer: Self-pay | Admitting: Emergency Medicine

## 2014-02-18 DIAGNOSIS — F17200 Nicotine dependence, unspecified, uncomplicated: Secondary | ICD-10-CM | POA: Diagnosis not present

## 2014-02-18 DIAGNOSIS — Z79899 Other long term (current) drug therapy: Secondary | ICD-10-CM | POA: Diagnosis not present

## 2014-02-18 DIAGNOSIS — G43909 Migraine, unspecified, not intractable, without status migrainosus: Secondary | ICD-10-CM | POA: Diagnosis not present

## 2014-02-18 DIAGNOSIS — R51 Headache: Secondary | ICD-10-CM | POA: Diagnosis present

## 2014-02-18 MED ORDER — SODIUM CHLORIDE 0.9 % IV SOLN
INTRAVENOUS | Status: DC
  Administered 2014-02-18: 1 mL/h via INTRAVENOUS

## 2014-02-18 MED ORDER — FENTANYL CITRATE 0.05 MG/ML IJ SOLN
50.0000 ug | Freq: Once | INTRAMUSCULAR | Status: AC
  Administered 2014-02-18: 50 ug via INTRAVENOUS
  Filled 2014-02-18: qty 2

## 2014-02-18 MED ORDER — PROCHLORPERAZINE EDISYLATE 5 MG/ML IJ SOLN
10.0000 mg | Freq: Once | INTRAMUSCULAR | Status: AC
  Administered 2014-02-18: 10 mg via INTRAVENOUS
  Filled 2014-02-18: qty 2

## 2014-02-18 MED ORDER — DIPHENHYDRAMINE HCL 50 MG/ML IJ SOLN
12.5000 mg | Freq: Once | INTRAMUSCULAR | Status: AC
  Administered 2014-02-18: 12.5 mg via INTRAVENOUS
  Filled 2014-02-18: qty 1

## 2014-02-18 NOTE — ED Notes (Signed)
Migraine started on Wed., medication not working. Now pt is vomiting.

## 2014-02-19 NOTE — ED Provider Notes (Signed)
CSN: [id number]     Arrival date & time 02/18/14  2220 History   First MD Initiated Contact with Patient 02/18/14 2256     Chief Complaint  Patient presents with  . Migraine   Patient is a 33 y.o. female presenting with migraines. The history is provided by the patient.  Migraine This is a recurrent problem. The current episode started more than 2 days ago. The problem occurs constantly. The problem has not changed since onset.Exacerbated by: light. Nothing relieves the symptoms. Treatments tried: her prescription medications. The treatment provided no relief.  Pt was unable to work tonight because of her headache.  This is worse than usual but feels like her typical migraine.  History reviewed. No pertinent past medical history. Past Surgical History  Procedure Laterality Date  . Cesarean section     History reviewed. No pertinent family history. History  Substance Use Topics  . Smoking status: Current Every Day Smoker  . Smokeless tobacco: Not on file  . Alcohol Use: Yes   OB History   Grav Para Term Preterm Abortions TAB SAB Ect Mult Living                 Review of Systems  All other systems reviewed and are negative.     Allergies  Morphine and related and Ibuprofen  Home Medications   Prior to Admission medications   Medication Sig Start Date End Date Taking? Authorizing Provider  Multiple Vitamin (MULTIVITAMIN) capsule Take 1 capsule by mouth daily.   Yes Historical Provider, MD  SUMAtriptan (IMITREX) 100 MG tablet Take 1 tablet (100 mg total) by mouth every 2 (two) hours as needed for migraine or headache. May repeat in 2 hours if headache persists or recurs. 01/27/14  Yes Alycia Rossetti, MD   BP 121/81  Pulse 76  Temp(Src) 98.8 F (37.1 C) (Oral)  Resp 20  Ht 5\' 5"  (1.651 m)  Wt 163 lb (73.936 kg)  BMI 27.12 kg/m2  SpO2 100%  LMP 02/18/2014 Physical Exam  Nursing note and vitals reviewed. Constitutional: She appears well-developed and well-nourished.  No distress.  HENT:  Head: Normocephalic and atraumatic.  Right Ear: External ear normal.  Left Ear: External ear normal.  Eyes: Conjunctivae are normal. Right eye exhibits no discharge. Left eye exhibits no discharge. No scleral icterus.  Neck: Neck supple. No tracheal deviation present.  Cardiovascular: Normal rate, regular rhythm and intact distal pulses.   Pulmonary/Chest: Effort normal and breath sounds normal. No stridor. No respiratory distress. She has no wheezes. She has no rales.  Abdominal: Soft. Bowel sounds are normal. She exhibits no distension. There is no tenderness. There is no rebound and no guarding.  Musculoskeletal: She exhibits no edema and no tenderness.  Neurological: She is alert. She has normal strength. No cranial nerve deficit (no facial droop, extraocular movements intact, no slurred speech) or sensory deficit. She exhibits normal muscle tone. She displays no seizure activity. Coordination normal.  Skin: Skin is warm and dry. No rash noted.  Psychiatric: She has a normal mood and affect.    ED Course  Procedures (including critical care time)  Medications  0.9 %  sodium chloride infusion (1 mL/hr Intravenous New Bag/Given 02/18/14 2330)  prochlorperazine (COMPAZINE) injection 10 mg (10 mg Intravenous Given 02/18/14 2328)  diphenhydrAMINE (BENADRYL) injection 12.5 mg (12.5 mg Intravenous Given 02/18/14 2328)  fentaNYL (SUBLIMAZE) injection 50 mcg (50 mcg Intravenous Given 02/18/14 2329)    MDM   Final diagnoses:  Migraine  without status migrainosus, not intractable, unspecified migraine type    1226  Feeling better.  Ready for discharge.  Consistent with a migraine exacerbation.  At this time there does not appear to be any evidence of an acute emergency medical condition and the patient appears stable for discharge with appropriate outpatient follow up.     Dorie Rank, MD 02/19/14 [phone]

## 2014-02-19 NOTE — Discharge Instructions (Signed)

## 2014-03-01 ENCOUNTER — Encounter: Payer: Self-pay | Admitting: Physician Assistant

## 2014-03-01 ENCOUNTER — Ambulatory Visit (INDEPENDENT_AMBULATORY_CARE_PROVIDER_SITE_OTHER): Payer: BC Managed Care – PPO | Admitting: Physician Assistant

## 2014-03-01 VITALS — BP 128/64 | HR 68 | Temp 97.9°F | Resp 14 | Ht 63.0 in | Wt 167.0 lb

## 2014-03-01 DIAGNOSIS — N760 Acute vaginitis: Secondary | ICD-10-CM

## 2014-03-01 DIAGNOSIS — R300 Dysuria: Secondary | ICD-10-CM

## 2014-03-01 DIAGNOSIS — A499 Bacterial infection, unspecified: Secondary | ICD-10-CM

## 2014-03-01 DIAGNOSIS — B9689 Other specified bacterial agents as the cause of diseases classified elsewhere: Secondary | ICD-10-CM

## 2014-03-01 LAB — URINALYSIS, MICROSCOPIC ONLY: Casts: NONE SEEN

## 2014-03-01 LAB — URINALYSIS, ROUTINE W REFLEX MICROSCOPIC
Glucose, UA: NEGATIVE mg/dL
NITRITE: NEGATIVE
Protein, ur: 30 mg/dL — AB
UROBILINOGEN UA: 0.2 mg/dL (ref 0.0–1.0)
pH: 6 (ref 5.0–8.0)

## 2014-03-01 LAB — WET PREP FOR TRICH, YEAST, CLUE
TRICH WET PREP: NONE SEEN
Yeast Wet Prep HPF POC: NONE SEEN

## 2014-03-01 MED ORDER — METRONIDAZOLE 500 MG PO TABS: 500.0000 mg | ORAL_TABLET | Freq: Two times a day (BID) | ORAL | Status: DC

## 2014-03-01 NOTE — Progress Notes (Signed)
    Patient ID: Sally [name] MRN: [id number], DOB: [birth year], 33 y.o. Date of Encounter: @DATE @  Chief Complaint:  Chief Complaint  Patient presents with  . Possible UTI/ yeast infection    thick white discharge noted- burning with urination- samll amount of itching  . Would like STD screen    HPI: 33 y.o. year old white female  presents with complaints of vaginal irritation and vaginal discharge. Also wants to check for STDs. Says she does not trust anyone and never can be sure."   History reviewed. No pertinent past medical history.   Home Meds: Outpatient Prescriptions Prior to Visit  Medication Sig Dispense Refill  . Multiple Vitamin (MULTIVITAMIN) capsule Take 1 capsule by mouth daily.      . SUMAtriptan (IMITREX) 100 MG tablet Take 1 tablet (100 mg total) by mouth every 2 (two) hours as needed for migraine or headache. May repeat in 2 hours if headache persists or recurs.  10 tablet  1   No facility-administered medications prior to visit.    Allergies:  Allergies  Allergen Reactions  . Morphine And Related   . Ibuprofen Itching and Rash    History   Social History  . Marital Status: Single    Spouse Name: N/A    Number of Children: N/A  . Years of Education: N/A   Occupational History  . Not on file.   Social History Main Topics  . Smoking status: Current Every Day Smoker  . Smokeless tobacco: Never Used  . Alcohol Use: Yes  . Drug Use: No  . Sexual Activity: No   Other Topics Concern  . Not on file   Social History Narrative  . No narrative on file    No family history on file.   Review of Systems:  See HPI for pertinent ROS. All other ROS negative.    Physical Exam: Blood pressure 128/64, pulse 68, temperature 97.9 F (36.6 C), temperature source Oral, resp. rate 14, height 5\' 3"  (1.6 m), weight 167 lb (75.751 kg), last menstrual period 02/18/2014., Body mass index is 29.59 kg/(m^2). General: WNWD WF. Appears in no acute  distress. Neck: Supple. No thyromegaly. No lymphadenopathy. Lungs: Clear bilaterally to auscultation without wheezes, rales, or rhonchi. Breathing is unlabored. Heart: RRR with S1 S2. No murmurs, rubs, or gallops. Musculoskeletal:  Strength and tone normal for age. Pelvic exam: External genitalia normal. Vaginal mucosa normal. Cervix normal. Small amount of liquidy white discharge present. Bimanual exam is normal with no cervical motion tenderness and no adnexal mass. Extremities/Skin: Warm and dry. Neuro: Alert and oriented X 3. Moves all extremities spontaneously. Gait is normal. CNII-XII grossly in tact. Psych:  Responds to questions appropriately with a normal affect.     ASSESSMENT AND PLAN:  33 y.o. year old female with  1. Bacterial vaginosis - metroNIDAZOLE (FLAGYL) 500 MG tablet; Take 1 tablet (500 mg total) by mouth 2 (two) times daily.  Dispense: 14 tablet; Refill: 0  2. Burning with urination - Urinalysis, Routine w reflex microscopic  3. Vaginitis and vulvovaginitis - WET PREP FOR TRICH, YEAST, CLUE - GC/Chlamydia Probe Amp  10/2013 RPR and HIV test were performed at the ER. Discussed with patient that I do not think we need to repeat these at this time and she is agreeable to just do a wet prep and GC.   Marin Olp [place], [place], [facility] 03/01/2014 6:02 PM

## 2014-03-02 LAB — GC/CHLAMYDIA PROBE AMP
CT PROBE, AMP APTIMA: NEGATIVE
GC PROBE AMP APTIMA: NEGATIVE

## 2014-03-08 ENCOUNTER — Telehealth: Payer: Self-pay | Admitting: *Deleted

## 2014-03-08 NOTE — Telephone Encounter (Signed)
She can try taking - Take Ibuprofen 600mg - every 6 hours as needed  #20 Phenergan 12.5mg  every 6 hours as needed  #20 Push fluids  Otherwise she needs to go to ER or come in for a visit to get IM medications

## 2014-03-08 NOTE — Telephone Encounter (Signed)
Received call from patient.   Reports that she has had a migraine since Monday, 03/06/2014.   States that she has been taking Imitrex (2) tabs daily with little to no relief.   MD please advise.

## 2014-03-08 NOTE — Telephone Encounter (Signed)
Patient made aware and appointment scheduled. °

## 2014-03-09 ENCOUNTER — Ambulatory Visit (INDEPENDENT_AMBULATORY_CARE_PROVIDER_SITE_OTHER): Payer: BC Managed Care – PPO | Admitting: Physician Assistant

## 2014-03-09 ENCOUNTER — Encounter: Payer: Self-pay | Admitting: Physician Assistant

## 2014-03-09 VITALS — BP 126/80 | HR 62 | Temp 98.5°F | Resp 14 | Ht 63.0 in | Wt 166.0 lb

## 2014-03-09 DIAGNOSIS — G43909 Migraine, unspecified, not intractable, without status migrainosus: Secondary | ICD-10-CM | POA: Insufficient documentation

## 2014-03-09 DIAGNOSIS — G43001 Migraine without aura, not intractable, with status migrainosus: Secondary | ICD-10-CM

## 2014-03-09 MED ORDER — KETOROLAC TROMETHAMINE 60 MG/2ML IM SOLN
60.0000 mg | Freq: Once | INTRAMUSCULAR | Status: AC
  Administered 2014-03-09: 60 mg via INTRAMUSCULAR

## 2014-03-09 MED ORDER — TOPIRAMATE 25 MG PO TABS: ORAL_TABLET | ORAL | Status: DC

## 2014-03-09 NOTE — Progress Notes (Signed)
Patient ID: Sally [name] MRN: [id number], DOB: Jul 01, 1981, 33 y.o. Date of Encounter: @DATE @  Chief Complaint:  Chief Complaint  Patient presents with  . Migraine    has had constant migraine since Monday- Imitrex is not effective- states that when she was taking pain meds with Imitrex it was effective    HPI: 33 y.o. year old white female  presents with above symptoms.   I reviewed her chart. She had an office visit here with Dr. Buelah Manis 01/27/14. At the time of that visit she reported that she had migraine headache for the past 3 days. She also reported history of migraines and she typically was getting a couple per month. At that time she was using Tylenol and rest to try to ease them. She reported a family history of migraines. At that visit Dr. Buelah Manis prescribed Imitrex to use for future migraines and also prescribed hydrocodone 5/325. It was noted that if they did become more frequent she would need prophylactic medication.  The patient also had an ER visit 02/18/14 secondary to migraine. That migraine  started more than 2 days prior to that ER visit. That note said that she had tried her Imitrex and Norco but not got no relief. They treated her with IV fentanyl Benadryl and Compazine.  Today patient states that she has been having migraine on average once a week. Says that with some of these she will take the Imitrex and got some relief.  However with this current headache she says that on Monday she took one Imitrex. On Tuesday she took one Imitrex. On Wednesday she took 2 Imitrex. Has had no relief. Says in general with her recent migraines she will take 2 pain pills (hydrocodone), 1 Imitrex and laid down and it would usually resolve.  She has positive photophobia, positive phonophobia, and nausea. As well she has been having vomiting with this episode.   History reviewed. No pertinent past medical history.   Home Meds: Outpatient Prescriptions Prior to Visit  Medication  Sig Dispense Refill  . Multiple Vitamin (MULTIVITAMIN) capsule Take 1 capsule by mouth daily.      . SUMAtriptan (IMITREX) 100 MG tablet Take 1 tablet (100 mg total) by mouth every 2 (two) hours as needed for migraine or headache. May repeat in 2 hours if headache persists or recurs.  10 tablet  1  . metroNIDAZOLE (FLAGYL) 500 MG tablet Take 1 tablet (500 mg total) by mouth 2 (two) times daily.  14 tablet  0   No facility-administered medications prior to visit.    Allergies:  Allergies  Allergen Reactions  . Morphine And Related   . Ibuprofen Itching and Rash    History   Social History  . Marital Status: Single    Spouse Name: N/A    Number of Children: N/A  . Years of Education: N/A   Occupational History  . Not on file.   Social History Main Topics  . Smoking status: Current Every Day Smoker  . Smokeless tobacco: Never Used  . Alcohol Use: Yes  . Drug Use: No  . Sexual Activity: No   Other Topics Concern  . Not on file   Social History Narrative  . No narrative on file    No family history on file.   Review of Systems:  See HPI for pertinent ROS. All other ROS negative.    Physical Exam: Blood pressure 126/80, pulse 62, temperature 98.5 F (36.9 C), temperature source Oral, resp. rate  14, height 5\' 3"  (1.6 m), weight 166 lb (75.297 kg), last menstrual period 02/18/2014., Body mass index is 29.41 kg/(m^2). General: WF. Lying on exam table in exam room with lights off. Neck: Supple. No thyromegaly. No lymphadenopathy. Lungs: Clear bilaterally to auscultation without wheezes, rales, or rhonchi. Breathing is unlabored. Heart: RRR with S1 S2. No murmurs, rubs, or gallops. Musculoskeletal:  Strength and tone normal for age. Extremities/Skin: Warm and dry. Neuro: Alert and oriented X 3. Moves all extremities spontaneously. Gait is normal. CNII-XII grossly in tact. No slurred speech, no facial droop. Pupils equal and reactive to light bilaterally.  Psych:  Responds  to questions appropriately with a normal affect.     ASSESSMENT AND PLAN:  33 y.o. year old female with  1. Migraine without aura and with status migrainosus, not intractable  For this current migraine, will give injection of Toradol.   To try to prevent future migraines, or at least decrease the frequency of them, will start Topamax.  Discussed with her that she will need to  gradually increase the dose as directed.   Also cautioned that side effects include cognitive slowing and peripheral numbness.  She is to schedule followup office visit in 4 weeks .  If she is tolerating the Topamax at that visit,  then we can give a new prescription for the 50 mg size tablet for easier dosing.   F/U sooner if needed or if having significant adverse effects with the Topamax.  - ketorolac (TORADOL) injection 60 mg; Inject 2 mLs (60 mg total) into the muscle once. - topiramate (TOPAMAX) 25 MG tablet; 1 at night        for 1 week then 1 twice a day  for 1 week then 1 in the morning, 2 at night for 1 week then 2 in the morning, 2 at night  Dispense: 60 tablet; Refill: 1   Signed, [address] [place], [place], [facility] 03/09/2014 10:09 AM

## 2014-03-10 ENCOUNTER — Encounter: Payer: Self-pay | Admitting: *Deleted

## 2014-03-10 ENCOUNTER — Telehealth: Payer: Self-pay | Admitting: *Deleted

## 2014-03-10 NOTE — Telephone Encounter (Signed)
Received call from patient.   Reports that she was seen on 03/09/2014 by PA for Migraine.   States that she was given new prescription for Topamax 25mg  1 at night for 1 week then, 1 twice a day for 1 week then, 1 in the morning, 2 at night for 1 week then, 2 in the morning, 2 at night.  States that she took Topamax this morning, but her migraine has returned.   Requested to know if she could take this medication with Imitrex or OTC NSAID like Ibuprofen.   MD please advise.

## 2014-03-10 NOTE — Telephone Encounter (Signed)
Yes she can take the medication with the imitrex

## 2014-03-10 NOTE — Telephone Encounter (Signed)
Call placed to patient and patient made aware.   Requested work note for 03/10/2014.  MD made aware and approved.   Letter sent.

## 2014-03-15 ENCOUNTER — Encounter (HOSPITAL_COMMUNITY): Payer: Self-pay | Admitting: Emergency Medicine

## 2014-03-15 ENCOUNTER — Emergency Department (HOSPITAL_COMMUNITY)
Admission: EM | Admit: 2014-03-15 | Discharge: 2014-03-15 | Disposition: A | Discharge status: Home / Self Care | Payer: BC Managed Care – PPO | Attending: Emergency Medicine | Admitting: Emergency Medicine

## 2014-03-15 DIAGNOSIS — F17200 Nicotine dependence, unspecified, uncomplicated: Secondary | ICD-10-CM | POA: Insufficient documentation

## 2014-03-15 DIAGNOSIS — G43901 Migraine, unspecified, not intractable, with status migrainosus: Secondary | ICD-10-CM | POA: Diagnosis not present

## 2014-03-15 DIAGNOSIS — R51 Headache: Secondary | ICD-10-CM | POA: Diagnosis present

## 2014-03-15 DIAGNOSIS — Z79899 Other long term (current) drug therapy: Secondary | ICD-10-CM | POA: Diagnosis not present

## 2014-03-15 MED ORDER — DIPHENHYDRAMINE HCL 50 MG/ML IJ SOLN
25.0000 mg | Freq: Once | INTRAMUSCULAR | Status: AC
  Administered 2014-03-15: 25 mg via INTRAVENOUS
  Filled 2014-03-15: qty 1

## 2014-03-15 MED ORDER — KETOROLAC TROMETHAMINE 30 MG/ML IJ SOLN
30.0000 mg | Freq: Once | INTRAMUSCULAR | Status: AC
  Administered 2014-03-15: 30 mg via INTRAVENOUS
  Filled 2014-03-15: qty 1

## 2014-03-15 MED ORDER — SODIUM CHLORIDE 0.9 % IV SOLN: 1000.0000 mL | INTRAVENOUS | Status: DC

## 2014-03-15 MED ORDER — METOCLOPRAMIDE HCL 5 MG/ML IJ SOLN
10.0000 mg | Freq: Once | INTRAMUSCULAR | Status: AC
  Administered 2014-03-15: 10 mg via INTRAVENOUS
  Filled 2014-03-15: qty 2

## 2014-03-15 MED ORDER — SODIUM CHLORIDE 0.9 % IV SOLN
1000.0000 mL | Freq: Once | INTRAVENOUS | Status: AC
  Administered 2014-03-15: 1000 mL via INTRAVENOUS

## 2014-03-15 MED ORDER — DEXAMETHASONE SODIUM PHOSPHATE 10 MG/ML IJ SOLN
10.0000 mg | Freq: Once | INTRAMUSCULAR | Status: AC
  Administered 2014-03-15: 10 mg via INTRAVENOUS
  Filled 2014-03-15: qty 1

## 2014-03-15 MED ORDER — METOCLOPRAMIDE HCL 10 MG PO TABS: 10.0000 mg | ORAL_TABLET | Freq: Four times a day (QID) | ORAL | Status: DC | PRN

## 2014-03-15 MED ORDER — SODIUM CHLORIDE 0.9 % IV SOLN: 1000.0000 mL | Freq: Once | INTRAVENOUS | Status: DC

## 2014-03-15 NOTE — Discharge Instructions (Signed)
Migraine Headache A migraine headache is an intense, throbbing pain on one or both sides of your head. A migraine can last for 30 minutes to several hours. CAUSES  The exact cause of a migraine headache is not always known. However, a migraine may be caused when nerves in the brain become irritated and release chemicals that cause inflammation. This causes pain. Certain things may also trigger migraines, such as:  Alcohol.  Smoking.  Stress.  Menstruation.  Aged cheeses.  Foods or drinks that contain nitrates, glutamate, aspartame, or tyramine.  Lack of sleep.  Chocolate.  Caffeine.  Hunger.  Physical exertion.  Fatigue.  Medicines used to treat chest pain (nitroglycerine), birth control pills, estrogen, and some blood pressure medicines. SIGNS AND SYMPTOMS  Pain on one or both sides of your head.  Pulsating or throbbing pain.  Severe pain that prevents daily activities.  Pain that is aggravated by any physical activity.  Nausea, vomiting, or both.  Dizziness.  Pain with exposure to bright lights, loud noises, or activity.  General sensitivity to bright lights, loud noises, or smells. Before you get a migraine, you may get warning signs that a migraine is coming (aura). An aura may include:  Seeing flashing lights.  Seeing bright spots, halos, or zigzag lines.  Having tunnel vision or blurred vision.  Having feelings of numbness or tingling.  Having trouble talking.  Having muscle weakness. DIAGNOSIS  A migraine headache is often diagnosed based on:  Symptoms.  Physical exam.  A CT scan or MRI of your head. These imaging tests cannot diagnose migraines, but they can help rule out other causes of headaches. TREATMENT Medicines may be given for pain and nausea. Medicines can also be given to help prevent recurrent migraines.  HOME CARE INSTRUCTIONS  Only take over-the-counter or prescription medicines for pain or discomfort as directed by your  health care provider. The use of long-term narcotics is not recommended.  Lie down in a dark, quiet room when you have a migraine.  Keep a journal to find out what may trigger your migraine headaches. For example, write down:  What you eat and drink.  How much sleep you get.  Any change to your diet or medicines.  Limit alcohol consumption.  Quit smoking if you smoke.  Get 7-9 hours of sleep, or as recommended by your health care provider.  Limit stress.  Keep lights dim if bright lights bother you and make your migraines worse. SEEK IMMEDIATE MEDICAL CARE IF:   Your migraine becomes severe.  You have a fever.  You have a stiff neck.  You have vision loss.  You have muscular weakness or loss of muscle control.  You start losing your balance or have trouble walking.  You feel faint or pass out.  You have severe symptoms that are different from your first symptoms. MAKE SURE YOU:   Understand these instructions.  Will watch your condition.  Will get help right away if you are not doing well or get worse. Document Released: 06/23/2005 Document Revised: 11/07/2013 Document Reviewed: 02/28/2013 [facility] Patient Information 2015 [place], [place]. This information is not intended to replace advice given to you by your health care provider. Make sure you discuss any questions you have with your health care provider.  Metoclopramide tablets What is this medicine? METOCLOPRAMIDE (met oh kloe PRA mide) is used to treat the symptoms of gastroesophageal reflux disease (GERD) like heartburn. It is also used to treat people with slow emptying of the stomach and  intestinal tract. This medicine may be used for other purposes; ask your health care provider or pharmacist if you have questions. COMMON BRAND NAME(S): Reglan What should I tell my health care provider before I take this medicine? They need to know if you have any of these conditions: -breast  cancer -depression -diabetes -heart failure -high blood pressure -kidney disease -liver disease -Parkinson's disease or a movement disorder -pheochromocytoma -seizures -stomach obstruction, bleeding, or perforation -an unusual or allergic reaction to metoclopramide, procainamide, sulfites, other medicines, foods, dyes, or preservatives -pregnant or trying to get pregnant -breast-feeding How should I use this medicine? Take this medicine by mouth with a glass of water. Follow the directions on the prescription label. Take this medicine on an empty stomach, about 30 minutes before eating. Take your doses at regular intervals. Do not take your medicine more often than directed. Do not stop taking except on the advice of your doctor or health care professional. A special MedGuide will be given to you by the pharmacist with each prescription and refill. Be sure to read this information carefully each time. Talk to your pediatrician regarding the use of this medicine in children. Special care may be needed. Overdosage: If you think you have taken too much of this medicine contact a poison control center or emergency room at once. NOTE: This medicine is only for you. Do not share this medicine with others. What if I miss a dose? If you miss a dose, take it as soon as you can. If it is almost time for your next dose, take only that dose. Do not take double or extra doses. What may interact with this medicine? -acetaminophen -cyclosporine -digoxin -medicines for blood pressure -medicines for diabetes, including insulin -medicines for hay fever and other allergies -medicines for depression, especially an Monoamine Oxidase Inhibitor (MAOI) -medicines for Parkinson's disease, like levodopa -medicines for sleep or for pain -tetracycline This list may not describe all possible interactions. Give your health care provider a list of all the medicines, herbs, non-prescription drugs, or dietary  supplements you use. Also tell them if you smoke, drink alcohol, or use illegal drugs. Some items may interact with your medicine. What should I watch for while using this medicine? It may take a few weeks for your stomach condition to start to get better. However, do not take this medicine for longer than 12 weeks. The longer you take this medicine, and the more you take it, the greater your chances are of developing serious side effects. If you are an elderly patient, a female patient, or you have diabetes, you may be at an increased risk for side effects from this medicine. Contact your doctor immediately if you start having movements you cannot control such as lip smacking, rapid movements of the tongue, involuntary or uncontrollable movements of the eyes, head, arms and legs, or muscle twitches and spasms. Patients and their families should watch out for worsening depression or thoughts of suicide. Also watch out for any sudden or severe changes in feelings such as feeling anxious, agitated, panicky, irritable, hostile, aggressive, impulsive, severely restless, overly excited and hyperactive, or not being able to sleep. If this happens, especially at the beginning of treatment or after a change in dose, call your doctor. Do not treat yourself for high fever. Ask your doctor or health care professional for advice. You may get drowsy or dizzy. Do not drive, use machinery, or do anything that needs mental alertness until you know how this drug affects you.  Do not stand or sit up quickly, especially if you are an older patient. This reduces the risk of dizzy or fainting spells. Alcohol can make you more drowsy and dizzy. Avoid alcoholic drinks. What side effects may I notice from receiving this medicine? Side effects that you should report to your doctor or health care professional as soon as possible: -allergic reactions like skin rash, itching or hives, swelling of the face, lips, or tongue -abnormal  production of milk in females -breast enlargement in both males and females -change in the way you walk -difficulty moving, speaking or swallowing -drooling, lip smacking, or rapid movements of the tongue -excessive sweating -fever -involuntary or uncontrollable movements of the eyes, head, arms and legs -irregular heartbeat or palpitations -muscle twitches and spasms -unusually weak or tired Side effects that usually do not require medical attention (report to your doctor or health care professional if they continue or are bothersome): -change in sex drive or performance -depressed mood -diarrhea -difficulty sleeping -headache -menstrual changes -restless or nervous This list may not describe all possible side effects. Call your doctor for medical advice about side effects. You may report side effects to FDA at 1-800-FDA-1088. Where should I keep my medicine? Keep out of the reach of children. Store at room temperature between 20 and 25 degrees C (68 and 77 degrees F). Protect from light. Keep container tightly closed. Throw away any unused medicine after the expiration date. NOTE: This sheet is a summary. It may not cover all possible information. If you have questions about this medicine, talk to your doctor, pharmacist, or health care provider.  2015, Elsevier/Gold Standard. (2011-10-21 13:04:38)

## 2014-03-15 NOTE — ED Notes (Signed)
Pt states migraine started Friday, has taken tylenol, topamax, and "migraine medication". No relief.

## 2014-03-15 NOTE — ED Provider Notes (Signed)
CSN: [id number]     Arrival date & time 03/15/14  0203 History   First MD Initiated Contact with Patient 03/15/14 [phone]     Chief Complaint  Patient presents with  . Migraine     (Consider location/radiation/quality/duration/timing/severity/associated sxs/prior Treatment) Patient is a 33 y.o. female presenting with migraines. The history is provided by the patient.  Migraine  She complains of a retro-orbital headache which started about 4 days ago and has been getting worse. Pain is throbbing and constant it is rated at 10/10. There is associated blurred vision, nausea, vomiting. She complains of photophobia and phonophobia. Nothing makes the headache any better. She's tried taking Imitrex with no benefit. She had seen her PCP 6 days ago for headache and received an injection of ketorolac which took the headache away for one day before this headache came on. She denies fever or chills. She denies stiff neck. She denies weakness, numbness, tingling.  History reviewed. No pertinent past medical history. Past Surgical History  Procedure Laterality Date  . Cesarean section     No family history on file. History  Substance Use Topics  . Smoking status: Current Every Day Smoker  . Smokeless tobacco: Never Used  . Alcohol Use: Yes   OB History   Grav Para Term Preterm Abortions TAB SAB Ect Mult Living                 Review of Systems  All other systems reviewed and are negative.     Allergies  Morphine and related and Ibuprofen  Home Medications   Prior to Admission medications   Medication Sig Start Date End Date Taking? Authorizing Provider  acetaminophen (TYLENOL) 325 MG tablet Take 650 mg by mouth every 4 (four) hours as needed for headache.   Yes Historical Provider, MD  Multiple Vitamin (MULTIVITAMIN) capsule Take 1 capsule by mouth daily.    Historical Provider, MD  SUMAtriptan (IMITREX) 100 MG tablet Take 1 tablet (100 mg total) by mouth every 2 (two) hours as needed  for migraine or headache. May repeat in 2 hours if headache persists or recurs. 01/27/14   Alycia Rossetti, MD  topiramate (TOPAMAX) 25 MG tablet 1 at night        for 1 week then 1 twice a day  for 1 week then 1 in the morning, 2 at night for 1 week then 2 in the morning, 2 at night 03/09/14   Orlena Sheldon, PA-C   BP 110/75  Pulse 66  Temp(Src) 98.2 F (36.8 C) (Oral)  Resp 15  Ht 5\' 6"  (1.676 m)  Wt 164 lb (74.39 kg)  BMI 26.48 kg/m2  SpO2 100%  LMP 02/18/2014 Physical Exam  Nursing note and vitals reviewed.  33 year old female, who appears uncomfortable, but is in no acute distress. Vital signs are normal. Oxygen saturation is 100%, which is normal. Head is normocephalic and atraumatic. PERRLA, EOMI. Oropharynx is clear. Fundi show no hemorrhage, exudate, or papilledema. Neck is nontender and supple without adenopathy or JVD. Back is nontender and there is no CVA tenderness. Lungs are clear without rales, wheezes, or rhonchi. Chest is nontender. Heart has regular rate and rhythm without murmur. Abdomen is soft, flat, nontender without masses or hepatosplenomegaly and peristalsis is normoactive. Extremities have no cyanosis or edema, full range of motion is present. Skin is warm and dry without rash. Neurologic: Mental status is normal, cranial nerves are intact, there are no motor or sensory deficits.  ED  Course  Procedures (including critical care time)  MDM   Final diagnoses:  Migraine with status migrainosus, not intractable, unspecified migraine type    Migraine headache. Old records are reviewed and she has several ED and office visits for migraine headaches. She was given a dose of ketorolac and her office visit last week even she is allergic to ibuprofen. She had no reaction. She will be treated with a migraine cocktail to include IV hydration, metoclopramide, diphenhydramine, ketorolac, and dexamethasone.  She had excellent relief of her headache with above noted  treatment. She is discharged with prescription for metoclopramide.  Delora Fuel, MD 80/32/12 2482

## 2014-03-31 ENCOUNTER — Telehealth: Payer: Self-pay | Admitting: Family Medicine

## 2014-03-31 ENCOUNTER — Ambulatory Visit (INDEPENDENT_AMBULATORY_CARE_PROVIDER_SITE_OTHER): Payer: BC Managed Care – PPO | Admitting: Family Medicine

## 2014-03-31 ENCOUNTER — Encounter: Payer: Self-pay | Admitting: Family Medicine

## 2014-03-31 VITALS — BP 122/78 | HR 64 | Temp 97.7°F | Resp 14 | Ht 65.0 in | Wt 167.0 lb

## 2014-03-31 DIAGNOSIS — G43001 Migraine without aura, not intractable, with status migrainosus: Secondary | ICD-10-CM

## 2014-03-31 DIAGNOSIS — B850 Pediculosis due to Pediculus humanus capitis: Secondary | ICD-10-CM

## 2014-03-31 DIAGNOSIS — K5900 Constipation, unspecified: Secondary | ICD-10-CM | POA: Insufficient documentation

## 2014-03-31 DIAGNOSIS — G43901 Migraine, unspecified, not intractable, with status migrainosus: Secondary | ICD-10-CM

## 2014-03-31 MED ORDER — SUMATRIPTAN SUCCINATE 100 MG PO TABS: ORAL_TABLET | ORAL | Status: DC

## 2014-03-31 MED ORDER — PROPRANOLOL HCL ER 60 MG PO CP24: 60.0000 mg | ORAL_CAPSULE | Freq: Every day | ORAL | Status: DC

## 2014-03-31 MED ORDER — METOCLOPRAMIDE HCL 10 MG PO TABS: 10.0000 mg | ORAL_TABLET | Freq: Four times a day (QID) | ORAL | Status: DC | PRN

## 2014-03-31 MED ORDER — PERMETHRIN 5 % EX CREA: TOPICAL_CREAM | CUTANEOUS | Status: DC

## 2014-03-31 NOTE — Progress Notes (Signed)
Patient ID: Sally [name], female   DOB: [birth year], 33 y.o.   MRN: [id number]   Subjective:    Patient ID: Sally [name], female    DOB: Jul 15, 1980, 33 y.o.   MRN: [id number]  Patient presents for Migraine and R side pain  patient here with migraine for the past 3-4 days. She's history of migraines. She was tried on Topamax but states this made her migraines worse she is currently on Imitrex the ran out of medication she was seen in the ED about 3 weeks ago was given Reglan at that time which helps her migraines. She gets nausea phonophobia associated.  Lice he has been in lice infestation in her home she's had some mild itching does not seem any actual lice.  The past 3 days she's had constipation which she feels like she is straining and there is small amount of bowel coming out like gravel. She denies any blood in the stool any significant abdominal pain.    Review Of Systems:  GEN- denies fatigue, fever, weight loss,weakness, recent illness HEENT- denies eye drainage, change in vision, nasal discharge, CVS- denies chest pain, palpitations RESP- denies SOB, cough, wheeze ABD- denies N/V, +change in stools, abd pain GU- denies dysuria, hematuria, dribbling, incontinence MSK- denies joint pain, muscle aches, injury Neuro- +headache, dizziness, syncope, seizure activity       Objective:    BP 122/78  Pulse 64  Temp(Src) 97.7 F (36.5 C) (Oral)  Resp 14  Ht 5\' 5"  (1.651 m)  Wt 167 lb (75.751 kg)  BMI 27.79 kg/m2 GEN- NAD, alert and oriented x3 HEENT- PERRL, EOMI, non injected sclera, pink conjunctiva, MMM, oropharynx clear, fundus benign CVS- RRR, no murmur RESP-CTAB ABD-NABS,soft,NT,ND Skin- Hair small nits seen, no active lice Pulses- Radial 2+        Assessment & Plan:      Problem List Items Addressed This Visit   Unspecified constipation   Migraine with status migrainosus - Primary   Relevant Medications      propranolol (INDERAL LA) 24 hr capsule      SUMAtriptan (IMITREX) tablet   Head lice      Note: This dictation was prepared with Dragon dictation along with smaller phrase technology. Any transcriptional errors that result from this process are unintentional.

## 2014-03-31 NOTE — Assessment & Plan Note (Signed)
Trial Inderal 60mg  once a day Imitrex and Reglan refilled If pt does not respond refer to [facility]

## 2014-03-31 NOTE — Patient Instructions (Signed)
Start inderal for migraines next week  Take reglan and imitrex as needed Miralax for your bowels If migraines do not improve referral to [facility] and wellness center Treat lice as prescribed F/U as needed

## 2014-03-31 NOTE — Assessment & Plan Note (Signed)
Given permethrin

## 2014-03-31 NOTE — Assessment & Plan Note (Signed)
MiraLAX samples given

## 2014-04-14 ENCOUNTER — Ambulatory Visit (INDEPENDENT_AMBULATORY_CARE_PROVIDER_SITE_OTHER): Payer: BC Managed Care – PPO | Admitting: Family Medicine

## 2014-04-14 ENCOUNTER — Encounter: Payer: Self-pay | Admitting: Family Medicine

## 2014-04-14 VITALS — BP 128/64 | HR 68 | Temp 98.2°F | Resp 16 | Ht 65.0 in | Wt 167.0 lb

## 2014-04-14 DIAGNOSIS — G43011 Migraine without aura, intractable, with status migrainosus: Secondary | ICD-10-CM

## 2014-04-14 MED ORDER — HYDROCODONE-ACETAMINOPHEN 5-325 MG PO TABS: 1.0000 | ORAL_TABLET | Freq: Four times a day (QID) | ORAL | Status: DC | PRN

## 2014-04-14 NOTE — Patient Instructions (Signed)
Referral to [facility] [place] course of pain medication F/U as needed

## 2014-04-14 NOTE — Assessment & Plan Note (Addendum)
Referral to [facility] center Given 20 tabs hydrocodone, multiple abortive medications tried in past Continue reglan for nausea associated Trying to avoid ED visit Continue Inderal

## 2014-04-14 NOTE — Progress Notes (Signed)
Patient ID: Sally [name], female   DOB: Sep 02, 1980, 33 y.o.   MRN: [id number]   Subjective:    Patient ID: Sally [name], female    DOB: Jul 28, 1980, 33 y.o.   MRN: [id number]  Patient presents for Migraine  patient here with acute migraine. She has no migraine disorder. She was started on Inderal 60 mg about 2 weeks ago she states she was doing well until a few days ago when one of her typical migraines came up it is been present for the past 3 days. She did take medication from one of her aunts she thinks it may of been Indocet She's been taking her Reglan but this has not helped. She's been on a host of different medications from triptans, NSAIDs that she has reactions with ibuprofen, Topamax without any improvement. Positive photophobia phonophobia positive nausea    Review Of Systems: per above  GEN- denies fatigue, fever, weight loss,weakness, recent illness HEENT- denies eye drainage, change in vision, nasal discharge, CVS- denies chest pain, palpitations RESP- denies SOB, cough, wheeze ABD- + N/V, change in stools, abd pain Neuro- denies headache, dizziness, syncope, seizure activity       Objective:    BP 128/64  Pulse 68  Temp(Src) 98.2 F (36.8 C) (Oral)  Resp 16  Ht 5\' 5"  (1.651 m)  Wt 167 lb (75.751 kg)  BMI 27.79 kg/m2 GEN- NAD, alert and oriented x3, sitting in dark room HEENT- PERRL, EOMI, non injected sclera, pink conjunctiva, MMM, oropharynx clear,  Neck- Supple,  Neuro- CNII-XII in tact, no deficits        Assessment & Plan:      Problem List Items Addressed This Visit   Migraine with status migrainosus - Primary   Relevant Medications      NORCO 5-325 MG PO TABS      Note: This dictation was prepared with Dragon dictation along with smaller phrase technology. Any transcriptional errors that result from this process are unintentional.

## 2014-05-01 ENCOUNTER — Encounter: Payer: Self-pay | Admitting: Family Medicine

## 2014-05-01 ENCOUNTER — Ambulatory Visit (INDEPENDENT_AMBULATORY_CARE_PROVIDER_SITE_OTHER): Payer: BC Managed Care – PPO | Admitting: Physician Assistant

## 2014-05-01 ENCOUNTER — Encounter: Payer: Self-pay | Admitting: Physician Assistant

## 2014-05-01 VITALS — BP 104/68 | HR 60 | Temp 98.1°F | Resp 18 | Wt 168.0 lb

## 2014-05-01 DIAGNOSIS — G43011 Migraine without aura, intractable, with status migrainosus: Secondary | ICD-10-CM

## 2014-05-01 MED ORDER — HYDROCODONE-ACETAMINOPHEN 5-325 MG PO TABS: 1.0000 | ORAL_TABLET | Freq: Four times a day (QID) | ORAL | Status: DC | PRN

## 2014-05-01 NOTE — Progress Notes (Signed)
Patient ID: Sally [name] MRN: [id number], DOB: 10/29/1980, 33 y.o. Date of Encounter: 05/01/2014, 9:23 AM    Chief Complaint:  Chief Complaint  Patient presents with  . migraine HA x 4 days     HPI: 33 y.o. year old white female here for migraine.  I reviewed her chart. She has had all the following visits secondary to migraines: 01/27/2014--Dr. [facility] 02/18/2014--ER 03/09/14--Renessa Wellnitz Ardath Sax 03/15/14--ER 03/31/14--Dr. Buelah Manis 04/14/14--Dr. [place]  She says that she is taking the propranolol daily. Says that she has used the Imitrex with this migraine and then used the last 2 of her hydrocodone yesterday. Says that she missed work secondary to this migraine last night but is scheduled to be off today and tomorrow.  She says that she had to drive herself to the appointment today and has no one to drive her home so she does not want any type of injection of medicine here. Says she did get an appointment scheduled to follow-up with a specialist but says that the appointment is not until December 21 and it is with the headache clinic in [place].  Says that she is having migraine pain behind both eyes. Has severe photophobia and phonophobia.     Home Meds:   Outpatient Prescriptions Prior to Visit  Medication Sig Dispense Refill  . acetaminophen (TYLENOL) 325 MG tablet Take 650 mg by mouth every 4 (four) hours as needed for headache.      . metoCLOPramide (REGLAN) 10 MG tablet Take 1 tablet (10 mg total) by mouth every 6 (six) hours as needed for nausea (or headache).  30 tablet  1  . Multiple Vitamin (MULTIVITAMIN) capsule Take 1 capsule by mouth daily.      . propranolol ER (INDERAL LA) 60 MG 24 hr capsule Take 1 capsule (60 mg total) by mouth daily. For migraines  30 capsule  3  . SUMAtriptan (IMITREX) 100 MG tablet Take 1 tablet, May repeat in 2 hours if headache persists or recurs.  10 tablet  1  . HYDROcodone-acetaminophen (NORCO) 5-325 MG per tablet Take 1 tablet by  mouth every 6 (six) hours as needed for moderate pain.  20 tablet  0  . permethrin (ACTICIN) 5 % cream Apply to scalp at night, use shower cap, rinse in AM  60 g  0   No facility-administered medications prior to visit.    Allergies:  Allergies  Allergen Reactions  . Morphine And Related   . Ibuprofen Itching and Rash      Review of Systems: See HPI for pertinent ROS. All other ROS negative.    Physical Exam: Blood pressure 104/68, pulse 60, temperature 98.1 F (36.7 C), temperature source Oral, resp. rate 18, weight 168 lb (76.204 kg)., Body mass index is 27.96 kg/(m^2). General: WNWD WF. Sitting in dark exam room.  Appears in no acute distress. Neck: Supple. No thyromegaly. No lymphadenopathy. Lungs: Clear bilaterally to auscultation without wheezes, rales, or rhonchi. Breathing is unlabored. Heart: Regular rhythm. No murmurs, rubs, or gallops. Msk:  Strength and tone normal for age. Extremities/Skin: Warm and dry. Neuro: Alert and oriented X 3. Moves all extremities spontaneously. Gait is normal. CNII-XII grossly in tact. Pupils equal and reactive to light bilaterally.  Psych:  Responds to questions appropriately with a normal affect.     ASSESSMENT AND PLAN:  33 y.o. year old female with  1. Intractable migraine without aura and with status migrainosus Will give her a note to cover her being out of  work last night. Continue the propranolol as preventive medication. Continue to use the Imitrex at onset of migraine. Will give her more hydrocodone to use for intractable headache. - HYDROcodone-acetaminophen (NORCO) 5-325 MG per tablet; Take 1 tablet by mouth every 6 (six) hours as needed for moderate pain.  Dispense: 40 tablet; Refill: 0   Signed, [address] [place], [place], [facility] 05/01/2014 9:23 AM

## 2014-05-10 ENCOUNTER — Encounter: Payer: Self-pay | Admitting: Physician Assistant

## 2014-05-10 ENCOUNTER — Encounter: Payer: Self-pay | Admitting: Family Medicine

## 2014-05-10 ENCOUNTER — Ambulatory Visit (INDEPENDENT_AMBULATORY_CARE_PROVIDER_SITE_OTHER): Payer: BC Managed Care – PPO | Admitting: Physician Assistant

## 2014-05-10 VITALS — BP 112/68 | HR 68 | Temp 98.1°F | Resp 18 | Wt 166.0 lb

## 2014-05-10 DIAGNOSIS — G43001 Migraine without aura, not intractable, with status migrainosus: Secondary | ICD-10-CM

## 2014-05-10 MED ORDER — KETOROLAC TROMETHAMINE 60 MG/2ML IM SOLN
60.0000 mg | Freq: Once | INTRAMUSCULAR | Status: AC
  Administered 2014-05-10: 60 mg via INTRAMUSCULAR

## 2014-05-10 NOTE — Progress Notes (Signed)
Patient ID: DEALVA LAFOY MRN: [id number], DOB: [birth year], 33 y.o. Date of Encounter: 05/10/2014, 9:36 AM    Chief Complaint:  Chief Complaint  Patient presents with  . migraine headache    since Saturday     HPI: 33 y.o. year old white female here for migraine.  She just recently had an office visit with me regarding migraine headache.   At that time of that visit, I reviewed her chart. She had had all the following visits secondary to migraines: 01/27/2014--Dr. [facility] 02/18/2014--ER 03/09/14--Janyah Singleterry Ardath Sax 03/15/14--ER 03/31/14--Dr. Buelah Manis 04/14/14--Dr. [place]  Now has also had OV with me 05/01/2014  She says that she is taking the propranolol daily.  Says she did get an appointment scheduled to follow-up with a specialist but says that the appointment is not until December 21 and it is with the Headache Clinic in [place].  Says that she is having migraine pain behind both eyes. Has severe photophobia and phonophobia. Says she has also had some vomiting with this migraine but does have medication to use prn at home. Says that last night she had to leave work because of migraine. She was already scheduled to be off work today. Says that she took a total of 4 hydrocodone yesterday and the pain eased some but has not completely resolved. Says that she works in a [place] where there are  lots of lights and sounds from [employer].  Reports that the symptoms she is having with this migraine are the same as her usual migraine symptoms. She is having no additional neurologic changes or deficits.  She says that today she does have someone with her who can drive her home from our appointment after medications.     Home Meds:   Outpatient Prescriptions Prior to Visit  Medication Sig Dispense Refill  . acetaminophen (TYLENOL) 325 MG tablet Take 650 mg by mouth every 4 (four) hours as needed for headache.    Marland Kitchen HYDROcodone-acetaminophen (NORCO)  5-325 MG per tablet Take 1 tablet by mouth every 6 (six) hours as needed for moderate pain. 40 tablet 0  . metoCLOPramide (REGLAN) 10 MG tablet Take 1 tablet (10 mg total) by mouth every 6 (six) hours as needed for nausea (or headache). 30 tablet 1  . Multiple Vitamin (MULTIVITAMIN) capsule Take 1 capsule by mouth daily.    . propranolol ER (INDERAL LA) 60 MG 24 hr capsule Take 1 capsule (60 mg total) by mouth daily. For migraines 30 capsule 3  . SUMAtriptan (IMITREX) 100 MG tablet Take 1 tablet, May repeat in 2 hours if headache persists or recurs. 10 tablet 1   No facility-administered medications prior to visit.    Allergies:  Allergies  Allergen Reactions  . Morphine And Related   . Ibuprofen Itching and Rash      Review of Systems: See HPI for pertinent ROS. All other ROS negative.    Physical Exam: Blood pressure 112/68, pulse 68, temperature 98.1 F (36.7 C), temperature source Oral, resp. rate 18, weight 166 lb (75.297 kg)., Body mass index is 27.62 kg/(m^2). General: WNWD WF. Sitting in dark exam room.  Appears in no acute distress. Neck: Supple. No thyromegaly. No lymphadenopathy. Lungs: Clear bilaterally to auscultation without wheezes, rales, or rhonchi. Breathing is unlabored. Heart: Regular rhythm. No murmurs, rubs, or gallops. Msk:  Strength and tone normal for age. Extremities/Skin: Warm and dry. Neuro: Alert and oriented X 3. Moves all extremities spontaneously. Gait is normal. CNII-XII grossly  in tact. Pupils equal and reactive to light bilaterally.  Romberg normal.  Psych:  Responds to questions appropriately with a normal affect.     ASSESSMENT AND PLAN:  33 y.o. year old female with  1. Intractable migraine without aura and with status migrainosus  1. Migraine without aura and with status migrainosus, not intractable - ketorolac (TORADOL) injection 60 mg; Inject 2 mLs (60 mg total) into the muscle once.  Continue the propranolol as preventive  medication. Continue to use the Imitrex at onset of migraine.  Keep appointment to follow-up with the headache clinic.  [address] [place], [place], [facility] 05/10/2014 9:36 AM

## 2014-05-12 ENCOUNTER — Encounter (HOSPITAL_COMMUNITY): Payer: Self-pay | Admitting: Emergency Medicine

## 2014-05-12 ENCOUNTER — Emergency Department (HOSPITAL_COMMUNITY)
Admission: EM | Admit: 2014-05-12 | Discharge: 2014-05-12 | Disposition: A | Discharge status: Home / Self Care | Payer: BC Managed Care – PPO | Attending: Emergency Medicine | Admitting: Emergency Medicine

## 2014-05-12 ENCOUNTER — Emergency Department (HOSPITAL_COMMUNITY): Payer: BC Managed Care – PPO

## 2014-05-12 ENCOUNTER — Telehealth: Payer: Self-pay | Admitting: *Deleted

## 2014-05-12 DIAGNOSIS — Z720 Tobacco use: Secondary | ICD-10-CM | POA: Diagnosis not present

## 2014-05-12 DIAGNOSIS — G43001 Migraine without aura, not intractable, with status migrainosus: Secondary | ICD-10-CM | POA: Insufficient documentation

## 2014-05-12 DIAGNOSIS — Z79899 Other long term (current) drug therapy: Secondary | ICD-10-CM | POA: Diagnosis not present

## 2014-05-12 DIAGNOSIS — R519 Headache, unspecified: Secondary | ICD-10-CM

## 2014-05-12 DIAGNOSIS — G43909 Migraine, unspecified, not intractable, without status migrainosus: Secondary | ICD-10-CM | POA: Diagnosis present

## 2014-05-12 DIAGNOSIS — R51 Headache: Secondary | ICD-10-CM

## 2014-05-12 HISTORY — DX: Migraine, unspecified, not intractable, without status migrainosus: G43.909

## 2014-05-12 MED ORDER — DEXAMETHASONE SODIUM PHOSPHATE 4 MG/ML IJ SOLN
4.0000 mg | Freq: Once | INTRAMUSCULAR | Status: AC
  Administered 2014-05-12: 4 mg via INTRAVENOUS
  Filled 2014-05-12: qty 1

## 2014-05-12 MED ORDER — VALPROATE SODIUM 500 MG/5ML IV SOLN
INTRAVENOUS | Status: AC
  Filled 2014-05-12: qty 5

## 2014-05-12 MED ORDER — DEXTROSE 5 % IV SOLN
500.0000 mg | Freq: Once | INTRAVENOUS | Status: AC
  Administered 2014-05-12: 500 mg via INTRAVENOUS
  Filled 2014-05-12: qty 5

## 2014-05-12 MED ORDER — PROMETHAZINE HCL 25 MG/ML IJ SOLN
25.0000 mg | Freq: Once | INTRAMUSCULAR | Status: AC
  Administered 2014-05-12: 25 mg via INTRAVENOUS
  Filled 2014-05-12: qty 1

## 2014-05-12 MED ORDER — DIPHENHYDRAMINE HCL 50 MG/ML IJ SOLN
25.0000 mg | Freq: Once | INTRAMUSCULAR | Status: AC
  Administered 2014-05-12: 25 mg via INTRAVENOUS
  Filled 2014-05-12: qty 1

## 2014-05-12 MED ORDER — SODIUM CHLORIDE 0.9 % IV BOLUS (SEPSIS)
500.0000 mL | Freq: Once | INTRAVENOUS | Status: AC
  Administered 2014-05-12: 500 mL via INTRAVENOUS

## 2014-05-12 NOTE — Telephone Encounter (Signed)
Pt called stating that was here earlier this week with symptoms of Migraine, now states that it is 10x worse and wants to know what she should do. Please advise

## 2014-05-12 NOTE — Discharge Instructions (Signed)

## 2014-05-12 NOTE — ED Provider Notes (Signed)
CSN: [id number]     Arrival date & time 05/12/14  1824 History   First MD Initiated Contact with Patient 05/12/14 2003     Chief Complaint  Patient presents with  . Migraine     (Consider location/radiation/quality/duration/timing/severity/associated sxs/prior Treatment) Patient is a 33 y.o. female presenting with migraines. The history is provided by the patient.  Migraine This is a recurrent problem. Associated symptoms include headaches. Pertinent negatives include no chest pain, no abdominal pain and no shortness of breath.  patient with recurrent migraine. Has had it for the last 3 days. Got seen at her PCP office and was given shot of Toradol that did not help. It is her typical headache. She's been getting them more frequently. It is a throbbing bitemporal headache. With photophobia and sonophobia. Some nausea without vomiting. No confusion. Her vision will sometimes get a little blurry.patient has follow-up with neurology in a month and a half.no relief with her medications at home.patient has never had a head CT for evaluation of her headaches.  Past Medical History  Diagnosis Date  . Migraine    Past Surgical History  Procedure Laterality Date  . Cesarean section     No family history on file. History  Substance Use Topics  . Smoking status: Current Every Day Smoker -- 0.50 packs/day    Types: Cigarettes  . Smokeless tobacco: Never Used  . Alcohol Use: No   OB History    No data available     Review of Systems  Constitutional: Negative for activity change and appetite change.  Eyes: Positive for visual disturbance. Negative for pain.  Respiratory: Negative for chest tightness and shortness of breath.   Cardiovascular: Negative for chest pain and leg swelling.  Gastrointestinal: Positive for nausea. Negative for vomiting, abdominal pain and diarrhea.  Genitourinary: Negative for flank pain.  Musculoskeletal: Negative for back pain and neck stiffness.  Skin:  Negative for rash.  Neurological: Positive for headaches. Negative for weakness and numbness.  Psychiatric/Behavioral: Negative for behavioral problems.      Allergies  Morphine and related and Ibuprofen  Home Medications   Prior to Admission medications   Medication Sig Start Date End Date Taking? Authorizing Provider  HYDROcodone-acetaminophen (NORCO) 5-325 MG per tablet Take 1 tablet by mouth every 6 (six) hours as needed for moderate pain. 05/01/14  Yes Mary B Dixon, PA-C  metoCLOPramide (REGLAN) 10 MG tablet Take 1 tablet (10 mg total) by mouth every 6 (six) hours as needed for nausea (or headache). 03/31/14  Yes Alycia Rossetti, MD  Multiple Vitamin (MULTIVITAMIN) capsule Take 1 capsule by mouth daily.   Yes Historical Provider, MD  propranolol ER (INDERAL LA) 60 MG 24 hr capsule Take 1 capsule (60 mg total) by mouth daily. For migraines 03/31/14  Yes Alycia Rossetti, MD  SUMAtriptan (IMITREX) 100 MG tablet Take 1 tablet, May repeat in 2 hours if headache persists or recurs. 03/31/14  Yes Alycia Rossetti, MD  acetaminophen (TYLENOL) 325 MG tablet Take 650 mg by mouth every 4 (four) hours as needed for headache.    Historical Provider, MD   BP 113/69 mmHg  Pulse 74  Temp(Src) 98.1 F (36.7 C) (Oral)  Resp 16  Ht 5\' 5"  (1.651 m)  Wt 163 lb (73.936 kg)  BMI 27.12 kg/m2  SpO2 99%  LMP 04/22/2014 Physical Exam  Constitutional: She is oriented to person, place, and time. She appears well-developed and well-nourished.  HENT:  Head: Normocephalic and atraumatic.  Eyes: Pupils  are equal, round, and reactive to light.  Cardiovascular: Normal rate, regular rhythm and normal heart sounds.   No murmur heard. Pulmonary/Chest: Effort normal and breath sounds normal. No respiratory distress. She has no wheezes. She has no rales.  Abdominal: Soft. Bowel sounds are normal. She exhibits no distension.  Musculoskeletal: Normal range of motion.  Neurological: She is alert and oriented to  person, place, and time. No cranial nerve deficit.  Skin: Skin is warm.  Psychiatric: She has a normal mood and affect. Her speech is normal.  Nursing note and vitals reviewed.   ED Course  Procedures (including critical care time) Labs Review Labs Reviewed - No data to display  Imaging Review Ct Head Wo Contrast  05/12/2014   CLINICAL DATA:  Headache. Migraine height headache. Patient has history of migraine headaches.  EXAM: CT HEAD WITHOUT CONTRAST  TECHNIQUE: Contiguous axial images were obtained from the base of the skull through the vertex without intravenous contrast.  COMPARISON:  None.  FINDINGS: Ventricles are normal in size and configuration. No parenchymal masses or mass effect. There are no areas of abnormal parenchymal attenuation. No evidence of an infarct. There are no extra-axial masses or abnormal fluid collections.  There is no intracranial hemorrhage.  Visualized sinuses and mastoid air cells are clear. No skull lesion.  IMPRESSION: Normal unenhanced CT scan of the brain.   Electronically Signed   By: Lajean Manes M.D.   On: 05/12/2014 21:41     EKG Interpretation None      MDM   Final diagnoses:  Headache  Migraine without aura and with status migrainosus, not intractable    Patient with headache. History of same. Has neurology follow-up. After initial treatment with Benadryl and Phenergan patients headache went from 10-7. Will give Depakote load and reevaluate. Will also give steroids.    Jasper Riling. Alvino Chapel, MD 05/12/14 2159

## 2014-05-12 NOTE — Telephone Encounter (Signed)
Advised pt if HA that bad and meds at home that she has are not working.  GO to ED

## 2014-05-12 NOTE — ED Notes (Signed)
PT c/o migraine with light/noise sensitivity and nausea. PT reports hx of migraines and was given an IM injection on Wednesday for migraine with no relief. PT has appointment at neurologist next month.

## 2014-06-06 ENCOUNTER — Emergency Department (HOSPITAL_COMMUNITY)
Admission: EM | Admit: 2014-06-06 | Discharge: 2014-06-06 | Disposition: A | Discharge status: Home / Self Care | Payer: BC Managed Care – PPO | Attending: Emergency Medicine | Admitting: Emergency Medicine

## 2014-06-06 ENCOUNTER — Encounter (HOSPITAL_COMMUNITY): Payer: Self-pay | Admitting: Emergency Medicine

## 2014-06-06 DIAGNOSIS — G43909 Migraine, unspecified, not intractable, without status migrainosus: Secondary | ICD-10-CM | POA: Insufficient documentation

## 2014-06-06 DIAGNOSIS — Z79899 Other long term (current) drug therapy: Secondary | ICD-10-CM | POA: Insufficient documentation

## 2014-06-06 DIAGNOSIS — N390 Urinary tract infection, site not specified: Secondary | ICD-10-CM | POA: Diagnosis not present

## 2014-06-06 DIAGNOSIS — Z720 Tobacco use: Secondary | ICD-10-CM | POA: Insufficient documentation

## 2014-06-06 DIAGNOSIS — Z87442 Personal history of urinary calculi: Secondary | ICD-10-CM | POA: Diagnosis not present

## 2014-06-06 DIAGNOSIS — R300 Dysuria: Secondary | ICD-10-CM | POA: Diagnosis present

## 2014-06-06 LAB — URINALYSIS, ROUTINE W REFLEX MICROSCOPIC
BILIRUBIN URINE: NEGATIVE
GLUCOSE, UA: NEGATIVE mg/dL
Ketones, ur: NEGATIVE mg/dL
Leukocytes, UA: NEGATIVE
Nitrite: NEGATIVE
PH: 6.5 (ref 5.0–8.0)
Protein, ur: NEGATIVE mg/dL
SPECIFIC GRAVITY, URINE: 1.02 (ref 1.005–1.030)
Urobilinogen, UA: 0.2 mg/dL (ref 0.0–1.0)

## 2014-06-06 LAB — URINE MICROSCOPIC-ADD ON

## 2014-06-06 MED ORDER — CEPHALEXIN 500 MG PO CAPS: 500.0000 mg | ORAL_CAPSULE | Freq: Four times a day (QID) | ORAL | Status: DC

## 2014-06-06 MED ORDER — CEPHALEXIN 500 MG PO CAPS
500.0000 mg | ORAL_CAPSULE | Freq: Once | ORAL | Status: AC
  Administered 2014-06-06: 500 mg via ORAL
  Filled 2014-06-06: qty 1

## 2014-06-06 NOTE — ED Notes (Signed)
Pt left ED, ambulatory with no signs of distress. Pt verbalized discharge understanding.

## 2014-06-06 NOTE — ED Notes (Signed)
Pt c/o dysuria since last Tuesday. States that she also passed "it looked like a little piece of candy rock" on Friday. Also c/o headache since Saturday.

## 2014-06-06 NOTE — ED Provider Notes (Signed)
CSN: [id number]     Arrival date & time 06/06/14  0125 History   First MD Initiated Contact with Patient 06/06/14 [phone]     Chief Complaint  Patient presents with  . Dysuria  . Headache     (Consider location/radiation/quality/duration/timing/severity/associated sxs/prior Treatment) HPI   Sally [name] is a 33 y.o. female who c/o dysuria for several days, achiness, N/V, and passing a crystal in her urine. She feels better after taking an antiemetic at home. No recent UTI. LMP 05/23/14. There are no other known modifying factors.  Past Medical History  Diagnosis Date  . Migraine    Past Surgical History  Procedure Laterality Date  . Cesarean section     No family history on file. History  Substance Use Topics  . Smoking status: Current Every Day Smoker -- 0.50 packs/day    Types: Cigarettes  . Smokeless tobacco: Never Used  . Alcohol Use: Yes     Comment: occasional   OB History    No data available     Review of Systems  All other systems reviewed and are negative.     Allergies  Morphine and related and Ibuprofen  Home Medications   Prior to Admission medications   Medication Sig Start Date End Date Taking? Authorizing Provider  acetaminophen (TYLENOL) 325 MG tablet Take 650 mg by mouth every 4 (four) hours as needed for headache.   Yes Historical Provider, MD  HYDROcodone-acetaminophen (NORCO) 5-325 MG per tablet Take 1 tablet by mouth every 6 (six) hours as needed for moderate pain. 05/01/14  Yes Mary B Dixon, PA-C  metoCLOPramide (REGLAN) 10 MG tablet Take 1 tablet (10 mg total) by mouth every 6 (six) hours as needed for nausea (or headache). 03/31/14  Yes Alycia Rossetti, MD  Multiple Vitamin (MULTIVITAMIN) capsule Take 1 capsule by mouth daily.   Yes Historical Provider, MD  propranolol ER (INDERAL LA) 60 MG 24 hr capsule Take 1 capsule (60 mg total) by mouth daily. For migraines 03/31/14  Yes Alycia Rossetti, MD  SUMAtriptan (IMITREX) 100 MG tablet Take  1 tablet, May repeat in 2 hours if headache persists or recurs. 03/31/14  Yes Alycia Rossetti, MD  cephALEXin (KEFLEX) 500 MG capsule Take 1 capsule (500 mg total) by mouth 4 (four) times daily. 06/06/14   Richarda Blade, MD   BP 107/72 mmHg  Pulse 61  Temp(Src) 98.3 F (36.8 C) (Oral)  Resp 18  Ht 5\' 5"  (1.651 m)  Wt 163 lb (73.936 kg)  BMI 27.12 kg/m2  SpO2 99%  LMP 05/23/2014 Physical Exam  Constitutional: She is oriented to person, place, and time. She appears well-developed and well-nourished.  HENT:  Head: Normocephalic and atraumatic.  Right Ear: External ear normal.  Left Ear: External ear normal.  Eyes: Conjunctivae and EOM are normal. Pupils are equal, round, and reactive to light.  Neck: Normal range of motion and phonation normal. Neck supple.  Cardiovascular: Normal rate, regular rhythm and normal heart sounds.   Pulmonary/Chest: Effort normal and breath sounds normal. She exhibits no bony tenderness.  Abdominal: Soft. There is tenderness (Suprapubic, mild.).  Genitourinary:  No CVAT  Musculoskeletal: Normal range of motion.  Neurological: She is alert and oriented to person, place, and time. No cranial nerve deficit or sensory deficit. She exhibits normal muscle tone. Coordination normal.  Skin: Skin is warm, dry and intact.  Psychiatric: She has a normal mood and affect. Her behavior is normal. Judgment and thought content normal.  Nursing note and vitals reviewed.   ED Course  Procedures (including critical care time) Labs Review Labs Reviewed  URINALYSIS, ROUTINE W REFLEX MICROSCOPIC - Abnormal; Notable for the following:    Hgb urine dipstick MODERATE (*)    All other components within normal limits  URINE MICROSCOPIC-ADD ON - Abnormal; Notable for the following:    Squamous Epithelial / LPF FEW (*)    Bacteria, UA FEW (*)    All other components within normal limits  URINE CULTURE    Imaging Review No results found.   EKG Interpretation None       MDM   Final diagnoses:  UTI (lower urinary tract infection)    Evaluation consistent with cystitis, without signs of systemic illness.  Doubt metabolic instability or serious bacterial infection.  Nursing Notes Reviewed/ Care Coordinated Applicable Imaging Reviewed Interpretation of Laboratory Data incorporated into ED treatment  The patient appears reasonably screened and/or stabilized for discharge and I doubt any other medical condition or other [facility] requiring further screening, evaluation, or treatment in the ED at this time prior to discharge.  Plan: Home Medications- Keflex; Home Treatments- rest; return here if the recommended treatment, does not improve the symptoms; Recommended follow up- PCP prn    Richarda Blade, MD 06/06/14 [phone]

## 2014-06-06 NOTE — Discharge Instructions (Signed)

## 2014-06-09 ENCOUNTER — Other Ambulatory Visit: Payer: Self-pay | Admitting: Family Medicine

## 2014-06-09 LAB — URINE CULTURE

## 2014-06-09 MED ORDER — SULFAMETHOXAZOLE-TRIMETHOPRIM 800-160 MG PO TABS: 1.0000 | ORAL_TABLET | Freq: Two times a day (BID) | ORAL | Status: DC

## 2014-06-10 ENCOUNTER — Telehealth: Payer: Self-pay | Admitting: Emergency Medicine

## 2014-06-10 NOTE — Progress Notes (Signed)
ED Antimicrobial Stewardship Positive Culture Follow Up   Sally [name] is an 33 y.o. female who presented to [facility] on 06/06/2014 with a chief complaint of  Chief Complaint  Patient presents with  . Dysuria  . Headache    Recent Results (from the past 720 hour(s))  Urine culture     Status: None   Collection Time: 06/06/14  2:43 AM  Result Value Ref Range Status   Specimen Description URINE, CLEAN CATCH  Final   Special Requests NONE  Final   Culture  Setup Time   Final    06/06/2014 13:38 Performed at [facility]   Final    60,000 COLONIES/ML Performed at [facility]    Culture   Final    ENTEROBACTER AEROGENES Performed at [facility]    Report Status 06/09/2014 FINAL  Final   Organism ID, Bacteria ENTEROBACTER AEROGENES  Final      Susceptibility   Enterobacter aerogenes - MIC*    CEFAZOLIN >=64 RESISTANT Resistant     CEFTRIAXONE <=1 SENSITIVE Sensitive     CIPROFLOXACIN <=0.25 SENSITIVE Sensitive     GENTAMICIN <=1 SENSITIVE Sensitive     LEVOFLOXACIN <=0.12 SENSITIVE Sensitive     NITROFURANTOIN 128 RESISTANT Resistant     TOBRAMYCIN <=1 SENSITIVE Sensitive     TRIMETH/SULFA <=20 SENSITIVE Sensitive     PIP/TAZO <=4 SENSITIVE Sensitive     * ENTEROBACTER AEROGENES    [x]  Treated with cephalexin, organism resistant to prescribed antimicrobial []  Patient discharged originally without antimicrobial agent and treatment is now indicated  New antibiotic prescription: Bactrim DS 1 tablet BID x 3 days.  Stop cephalexin.  ED Provider: Margarita Mail PA-C   Candie Mile 06/10/2014, 11:48 AM Infectious Diseases Pharmacist Phone# [phone]

## 2014-06-12 ENCOUNTER — Telehealth: Payer: Self-pay | Admitting: Family Medicine

## 2014-06-12 ENCOUNTER — Telehealth: Payer: Self-pay | Admitting: *Deleted

## 2014-06-12 ENCOUNTER — Other Ambulatory Visit: Payer: Self-pay | Admitting: Physician Assistant

## 2014-06-12 ENCOUNTER — Encounter: Payer: Self-pay | Admitting: Physician Assistant

## 2014-06-12 ENCOUNTER — Other Ambulatory Visit (HOSPITAL_COMMUNITY): Payer: Self-pay | Admitting: Family Medicine

## 2014-06-12 ENCOUNTER — Ambulatory Visit (HOSPITAL_COMMUNITY)
Admission: RE | Admit: 2014-06-12 | Discharge: 2014-06-12 | Disposition: A | Discharge status: Home / Self Care | Payer: BC Managed Care – PPO | Source: Ambulatory Visit | Attending: Physician Assistant | Admitting: Physician Assistant

## 2014-06-12 ENCOUNTER — Ambulatory Visit (INDEPENDENT_AMBULATORY_CARE_PROVIDER_SITE_OTHER): Payer: BC Managed Care – PPO | Admitting: Physician Assistant

## 2014-06-12 ENCOUNTER — Other Ambulatory Visit: Payer: Self-pay | Admitting: Family Medicine

## 2014-06-12 VITALS — BP 112/70 | HR 68 | Temp 98.0°F | Resp 18 | Wt 174.0 lb

## 2014-06-12 DIAGNOSIS — N200 Calculus of kidney: Secondary | ICD-10-CM | POA: Insufficient documentation

## 2014-06-12 DIAGNOSIS — R1031 Right lower quadrant pain: Secondary | ICD-10-CM

## 2014-06-12 DIAGNOSIS — G43011 Migraine without aura, intractable, with status migrainosus: Secondary | ICD-10-CM

## 2014-06-12 DIAGNOSIS — R3129 Other microscopic hematuria: Secondary | ICD-10-CM

## 2014-06-12 DIAGNOSIS — D259 Leiomyoma of uterus, unspecified: Secondary | ICD-10-CM

## 2014-06-12 DIAGNOSIS — R5081 Fever presenting with conditions classified elsewhere: Secondary | ICD-10-CM

## 2014-06-12 DIAGNOSIS — R300 Dysuria: Secondary | ICD-10-CM

## 2014-06-12 DIAGNOSIS — R319 Hematuria, unspecified: Secondary | ICD-10-CM | POA: Diagnosis not present

## 2014-06-12 DIAGNOSIS — R312 Other microscopic hematuria: Secondary | ICD-10-CM

## 2014-06-12 LAB — COMPLETE METABOLIC PANEL WITH GFR
ALT: 13 U/L (ref 0–35)
AST: 16 U/L (ref 0–37)
Albumin: 4.3 g/dL (ref 3.5–5.2)
Alkaline Phosphatase: 75 U/L (ref 39–117)
BUN: 16 mg/dL (ref 6–23)
CALCIUM: 9.6 mg/dL (ref 8.4–10.5)
CO2: 25 mEq/L (ref 19–32)
CREATININE: 0.82 mg/dL (ref 0.50–1.10)
Chloride: 103 mEq/L (ref 96–112)
GFR, Est African American: >89 mL/min
GFR, Est Non African American: >89 mL/min
GLUCOSE: 98 mg/dL (ref 70–99)
Potassium: 4 mEq/L (ref 3.5–5.3)
Sodium: 138 mEq/L (ref 135–145)
Total Bilirubin: 0.3 mg/dL (ref 0.2–1.2)
Total Protein: 6.2 g/dL (ref 6.0–8.3)

## 2014-06-12 LAB — URINALYSIS, ROUTINE W REFLEX MICROSCOPIC
BILIRUBIN URINE: NEGATIVE
Glucose, UA: NEGATIVE mg/dL
Ketones, ur: NEGATIVE mg/dL
Leukocytes, UA: NEGATIVE
Nitrite: NEGATIVE
Protein, ur: NEGATIVE mg/dL
UROBILINOGEN UA: 1 mg/dL (ref 0.0–1.0)
pH: 6 (ref 5.0–8.0)

## 2014-06-12 LAB — CBC W/MCH & 3 PART DIFF
HCT: 34.8 % — ABNORMAL LOW (ref 36.0–46.0)
Hemoglobin: 12.6 g/dL (ref 12.0–15.0)
LYMPHS PCT: 49 % — AB (ref 12–46)
Lymphs Abs: 5.7 10*3/uL — ABNORMAL HIGH (ref 0.7–4.0)
MCH: 32.3 pg (ref 26.0–34.0)
MCHC: 36.2 g/dL — ABNORMAL HIGH (ref 30.0–36.0)
MCV: 89.2 fL (ref 78.0–100.0)
Neutro Abs: 5 10*3/uL (ref 1.7–7.7)
Neutrophils Relative %: 43 % (ref 43–77)
PLATELETS: 284 10*3/uL (ref 150–400)
RBC: 3.9 MIL/uL (ref 3.87–5.11)
RDW: 12.9 % (ref 11.5–15.5)
WBC mixed population %: 8 % (ref 3–18)
WBC mixed population: 0.9 10*3/uL (ref 0.1–1.8)
WBC: 11.6 10*3/uL — AB (ref 4.0–10.5)

## 2014-06-12 LAB — URINALYSIS, MICROSCOPIC ONLY: Casts: NONE SEEN

## 2014-06-12 MED ORDER — IOHEXOL 300 MG/ML  SOLN: 150.0000 mL | Freq: Once | INTRAMUSCULAR | Status: AC | PRN

## 2014-06-12 MED ORDER — SODIUM CHLORIDE 0.9 % IV SOLN
INTRAVENOUS | Status: AC
  Filled 2014-06-12: qty 250

## 2014-06-12 MED ORDER — HYDROCODONE-ACETAMINOPHEN 5-325 MG PO TABS: 1.0000 | ORAL_TABLET | Freq: Four times a day (QID) | ORAL | Status: DC | PRN

## 2014-06-12 NOTE — Telephone Encounter (Signed)
Pt called back and wants [place] to do referral to GYN.  Referral initiated

## 2014-06-12 NOTE — Progress Notes (Signed)
Patient ID: MONROE TOURE MRN: [id number], DOB: [birth year], 33 y.o. Date of Encounter: @DATE @  Chief Complaint:  Chief Complaint  Patient presents with  . c/o UTI/migraine HA    fever, flank pain   seen ED last week for same  still not better    HPI: 33 y.o. year old white female  presents with above symptoms.  I reviewed the ER note from 06/06/2014. That note history of present illness simply states that she " was complaining of dysuria for several days, achiness, nausea/vomiting, and passing a crystal in her urine". It stated that "she was feeling better after taking an anti-emetic at home. No recent UTI. LMP 05/23/14. No other known modifying factors. " The ER performed a urinalysis and sent urine culture. No other types of labs or imaging were performed. Urine culture result did come back on 06/06/14 and was reviewed by Dr. Dennard Schaumann. His result note states D/C Keflex and start Bactrim. Patient states that she did stop the Keflex and is taking the Bactrim as directed.  Today is Monday 06/12/14. She reports that on this past Saturday and Sunday she was having fever but did not check it with a thermometer to know the exact reading. She says that this morning at 5 AM fever was 101.9 and she took some Tylenol.  She says that she is having pain in her right low back and around her right side to the right lower abdomen. She also says that she is still having dysuria.  I reviewed the fact that the ER note above reported that she had seen a "crystal" in her urine. She says that this is true but states that she had no prior history of known kidney stones.  Verified that she currently is not on her menses. As well she says that she is seeing no gross hematuria.  She says that the pain she has been experiencing has been severe and was "a 10" on Saturday. Says that the pain comes and goes and that the intensity increases and decreases.   Past Medical History  Diagnosis Date  . Migraine       Home Meds: Outpatient Prescriptions Prior to Visit  Medication Sig Dispense Refill  . acetaminophen (TYLENOL) 325 MG tablet Take 650 mg by mouth every 4 (four) hours as needed for headache.    . cephALEXin (KEFLEX) 500 MG capsule Take 1 capsule (500 mg total) by mouth 4 (four) times daily. 28 capsule 0  . metoCLOPramide (REGLAN) 10 MG tablet Take 1 tablet (10 mg total) by mouth every 6 (six) hours as needed for nausea (or headache). 30 tablet 1  . Multiple Vitamin (MULTIVITAMIN) capsule Take 1 capsule by mouth daily.    . propranolol ER (INDERAL LA) 60 MG 24 hr capsule Take 1 capsule (60 mg total) by mouth daily. For migraines 30 capsule 3  . SUMAtriptan (IMITREX) 100 MG tablet Take 1 tablet, May repeat in 2 hours if headache persists or recurs. 10 tablet 1  . sulfamethoxazole-trimethoprim (BACTRIM DS,SEPTRA DS) 800-160 MG per tablet Take 1 tablet by mouth 2 (two) times daily. (Patient not taking: Reported on 06/12/2014) 10 tablet 0  . HYDROcodone-acetaminophen (NORCO) 5-325 MG per tablet Take 1 tablet by mouth every 6 (six) hours as needed for moderate pain. (Patient not taking: Reported on 06/12/2014) 40 tablet 0   No facility-administered medications prior to visit.    Allergies:  Allergies  Allergen Reactions  . Morphine And Related Itching  . Ibuprofen Itching  and Rash    History   Social History  . Marital Status: Single    Spouse Name: N/A    Number of Children: N/A  . Years of Education: N/A   Occupational History  . Not on file.   Social History Main Topics  . Smoking status: Current Every Day Smoker -- 0.50 packs/day    Types: Cigarettes  . Smokeless tobacco: Never Used  . Alcohol Use: Yes     Comment: occasional  . Drug Use: No  . Sexual Activity: No   Other Topics Concern  . Not on file   Social History Narrative    History reviewed. No pertinent family history.   Review of Systems:  See HPI for pertinent ROS. All other ROS negative.    Physical  Exam: Blood pressure 112/70, pulse 68, temperature 98 F (36.7 C), temperature source Oral, resp. rate 18, weight 174 lb (78.926 kg), last menstrual period 05/23/2014., Body mass index is 28.96 kg/(m^2). General: WNWD WF. Appears in no acute distress. Neck: Supple. No thyromegaly. No lymphadenopathy. Lungs: Clear bilaterally to auscultation without wheezes, rales, or rhonchi. Breathing is unlabored. Heart: RRR with S1 S2. No murmurs, rubs, or gallops. Abdomen: Soft,  non-distended with normoactive bowel sounds. No hepatomegaly. No rebound/guarding. No obvious abdominal masses. She reports minimal tenderness with palpation of left lower quadrant. Reports moderate tenderness with palpation of right lower quadrant. Reports no tenderness with palpation across both sides of the upper abdomen and epigastric region. Musculoskeletal:  Strength and tone normal for age. No tenderness with percussion of costophrenic angles bilaterally. She points to approximate L1 level on the right side as the area of her discomfort. She points there and then down to the right flank (also at about that same level) --to her right lower quadrant 9also at about that same level.) Extremities/Skin: Warm and dry.  No rashes. Neuro: Alert and oriented X 3. Moves all extremities spontaneously. Gait is normal. CNII-XII grossly in tact. Psych:  Responds to questions appropriately with a normal affect.     ASSESSMENT AND PLAN:  33 y.o. year old female with  1. Right lower quadrant abdominal pain - Urinalysis, Routine w reflex microscopic - CBC w/MCH & 3 Part Diff - COMPLETE METABOLIC PANEL WITH GFR - Urine Microscopic - HYDROcodone-acetaminophen (NORCO) 5-325 MG per tablet; Take 1 tablet by mouth every 6 (six) hours as needed for moderate pain.  Dispense: 40 tablet; Refill: 0 - CT Abd Limited W/O Cm; Future  2. Fever presenting with conditions classified elsewhere - Urinalysis, Routine w reflex microscopic - CBC w/MCH & 3  Part Diff - COMPLETE METABOLIC PANEL WITH GFR - Urine Microscopic - HYDROcodone-acetaminophen (NORCO) 5-325 MG per tablet; Take 1 tablet by mouth every 6 (six) hours as needed for moderate pain.  Dispense: 40 tablet; Refill: 0 - CT Abd Limited W/O Cm; Future  3. Microscopic hematuria - Urinalysis, Routine w reflex microscopic - CBC w/MCH & 3 Part Diff - COMPLETE METABOLIC PANEL WITH GFR - Urine Microscopic - HYDROcodone-acetaminophen (NORCO) 5-325 MG per tablet; Take 1 tablet by mouth every 6 (six) hours as needed for moderate pain.  Dispense: 40 tablet; Refill: 0 - CT Abd Limited W/O Cm; Future  4. Difficult or painful urination - Urinalysis, Routine w reflex microscopic - CBC w/MCH & 3 Part Diff - COMPLETE METABOLIC PANEL WITH GFR - Urine Microscopic - HYDROcodone-acetaminophen (NORCO) 5-325 MG per tablet; Take 1 tablet by mouth every 6 (six) hours as needed for moderate pain.  Dispense: 40 tablet; Refill: 0 - CT Abd Limited W/O Cm; Future  She reports that she does have her appendix. Has not had appendectomy. I checked urinalysis and a CBC in the office and obtained these results and reviewed them with her while here in the office.  Given that she is reporting that she has had fevers over the weekend despite being on proper antibiotic to treat the urine infection seen on recent culture,  And the fact that she is having pain in her right lower quadrant, I feel that I need to get CT to rule out appendicitis.  However, given that she recently saw "a crystal" in her urine and that she is still having microscopic hematuria despite being on appropriate antibiotic, a definitely think kidney stone is a possibility.  Will send for CT now and will obtain other lab. She is saying that she is out of hydrocodone to use for her migraines so will go ahead and print her 1 prescription of hydrocodone which could be used for this pain if it is kidney stone.   [address] [place], [place],  [facility] 06/12/2014 4:25 PM

## 2014-06-12 NOTE — Telephone Encounter (Signed)
If she has to, she can take 1-1/2 or even 2 of hydrocodone at a time for pain. Also please order her a referral to urology to follow up to assess why she has so many kidney stones. As already mentioned on prior note, she is also to schedule follow-up with GYN regarding ovarian cyst and uterine fibroids.

## 2014-06-12 NOTE — Telephone Encounter (Signed)
Wants to know if can have something stronger for her pain.  Takes Hydrocodone for HA's but not helping her other pain?

## 2014-06-12 NOTE — Telephone Encounter (Signed)
Received call from [facility].   States that patient was seen for CT and IV was required. Reports that patient was stuck x1 with no success. Reports that patient is now refusing to have IV started and is wanting to leave the premesis.   PA made aware and new orders for CT with out contrast obtained.   [facility] made aware.

## 2014-06-13 NOTE — Telephone Encounter (Signed)
Pt aware of provider recommendations.  Referrals have been intiated

## 2014-06-14 ENCOUNTER — Telehealth (HOSPITAL_BASED_OUTPATIENT_CLINIC_OR_DEPARTMENT_OTHER): Payer: Self-pay | Admitting: Emergency Medicine

## 2014-06-21 ENCOUNTER — Telehealth: Payer: Self-pay | Admitting: Family Medicine

## 2014-06-21 DIAGNOSIS — R1031 Right lower quadrant pain: Secondary | ICD-10-CM

## 2014-06-21 DIAGNOSIS — R300 Dysuria: Secondary | ICD-10-CM

## 2014-06-21 DIAGNOSIS — R3129 Other microscopic hematuria: Secondary | ICD-10-CM

## 2014-06-21 DIAGNOSIS — G43011 Migraine without aura, intractable, with status migrainosus: Secondary | ICD-10-CM

## 2014-06-21 DIAGNOSIS — R5081 Fever presenting with conditions classified elsewhere: Secondary | ICD-10-CM

## 2014-06-21 MED ORDER — HYDROCODONE-ACETAMINOPHEN 5-325 MG PO TABS: 1.0000 | ORAL_TABLET | Freq: Four times a day (QID) | ORAL | Status: DC | PRN

## 2014-06-21 NOTE — Telephone Encounter (Signed)
Rx printed for provider signature.  Pt aware ready for pick up.

## 2014-06-21 NOTE — Telephone Encounter (Signed)
Needs refill of Hydrocodone.  OK refill?  Is seeing GYN  And had Urology appt pending

## 2014-06-21 NOTE — Telephone Encounter (Signed)
Reviewed her chart indicates that on 06/12/14 I prescribed #40.  Can Rx Hydrocodone 5/325  # 60 + 0.

## 2014-07-02 ENCOUNTER — Telehealth (HOSPITAL_COMMUNITY): Payer: Self-pay

## 2014-07-02 NOTE — ED Notes (Signed)
Unable to contact pt by mail or telephone. Unable to communicate lab results or treatment changes. 

## 2014-07-18 ENCOUNTER — Telehealth: Payer: Self-pay | Admitting: Family Medicine

## 2014-07-18 NOTE — Telephone Encounter (Signed)
Patient is requesting refill on hydrocodone and migrane meds  [phone] [facility] [place]

## 2014-07-18 NOTE — Telephone Encounter (Signed)
LRF Hydrocodone 06/21/14 #60    Imitrex Rf 9/25 #10 + 1.  OK refill?

## 2014-07-18 NOTE — Telephone Encounter (Signed)
I would defer this to MBD.  She just had 60 vicodin and I do not see her.

## 2014-07-19 NOTE — Telephone Encounter (Signed)
Headache doctor called and rescheduled appt for 07/27/14.

## 2014-07-19 NOTE — Telephone Encounter (Signed)
I reviewed my prior notes. They state that she was having an appointment with the headache clinic December 21. They should be prescribing these medications at this point. DENIED.

## 2014-07-19 NOTE — Telephone Encounter (Signed)
Call Headache Center and Verify Date of Appt.

## 2014-07-19 NOTE — Telephone Encounter (Signed)
[place].  She had appt for 06/05/14 which she NO Showed.  Rescheduled for 06/26/14, PATIENT canceled and rescheduled that herself.  Now has appt for 08/01/14.

## 2014-07-19 NOTE — Telephone Encounter (Signed)
MEDICATION DENIED !! Tell her I will NOT prescribe ANY further controlled medication.

## 2014-07-20 MED ORDER — SUMATRIPTAN SUCCINATE 100 MG PO TABS
ORAL_TABLET | ORAL | Status: DC
Start: 2014-07-20 — End: 2015-05-03

## 2014-07-21 NOTE — Telephone Encounter (Signed)
Pt aware Imitrex was refilled.  Told her to keep appt with headache specialist.  She said she was not asking for refill of Hydrocodone!!

## 2014-07-31 ENCOUNTER — Encounter: Payer: Self-pay | Admitting: Family Medicine

## 2014-08-25 ENCOUNTER — Telehealth: Payer: Self-pay | Admitting: Family Medicine

## 2014-08-25 NOTE — Telephone Encounter (Signed)
DENIED 

## 2014-08-25 NOTE — Telephone Encounter (Signed)
LRF 12/16  #60  LOV 06/12/14  Pt is asking you now because other provider has denied.  Has failed to keep appointment with headache specialist.  11/30 No show, 12/21 resched, 1/26 resched, 2/18 cancelled.  Now has appt for 10/06/14.

## 2014-08-25 NOTE — Telephone Encounter (Signed)
Patient would like refill on her hydrocodone if possible for her migranes  [phone]

## 2014-08-28 NOTE — Telephone Encounter (Signed)
Left voice mail for patient.  NO REFILL on her Hydrocodone.  Suggested she KEEP her appt with headache specialist.

## 2014-09-12 ENCOUNTER — Emergency Department (HOSPITAL_COMMUNITY)
Admission: EM | Admit: 2014-09-12 | Discharge: 2014-09-13 | Disposition: A | Discharge status: Home / Self Care | Payer: BLUE CROSS/BLUE SHIELD | Attending: Emergency Medicine | Admitting: Emergency Medicine

## 2014-09-12 ENCOUNTER — Encounter (HOSPITAL_COMMUNITY): Payer: Self-pay | Admitting: Emergency Medicine

## 2014-09-12 DIAGNOSIS — R1030 Lower abdominal pain, unspecified: Secondary | ICD-10-CM | POA: Insufficient documentation

## 2014-09-12 DIAGNOSIS — Z792 Long term (current) use of antibiotics: Secondary | ICD-10-CM | POA: Diagnosis not present

## 2014-09-12 DIAGNOSIS — R112 Nausea with vomiting, unspecified: Secondary | ICD-10-CM | POA: Insufficient documentation

## 2014-09-12 DIAGNOSIS — Z79899 Other long term (current) drug therapy: Secondary | ICD-10-CM | POA: Insufficient documentation

## 2014-09-12 DIAGNOSIS — Z720 Tobacco use: Secondary | ICD-10-CM | POA: Insufficient documentation

## 2014-09-12 DIAGNOSIS — Z3202 Encounter for pregnancy test, result negative: Secondary | ICD-10-CM | POA: Diagnosis not present

## 2014-09-12 DIAGNOSIS — R109 Unspecified abdominal pain: Secondary | ICD-10-CM

## 2014-09-12 DIAGNOSIS — G43909 Migraine, unspecified, not intractable, without status migrainosus: Secondary | ICD-10-CM | POA: Insufficient documentation

## 2014-09-12 LAB — COMPREHENSIVE METABOLIC PANEL
ALBUMIN: 4.2 g/dL (ref 3.5–5.2)
ALT: 22 U/L (ref 0–35)
AST: 23 U/L (ref 0–37)
Alkaline Phosphatase: 58 U/L (ref 39–117)
Anion gap: 7 (ref 5–15)
BUN: 17 mg/dL (ref 6–23)
CO2: 23 mmol/L (ref 19–32)
Calcium: 9 mg/dL (ref 8.4–10.5)
Chloride: 109 mmol/L (ref 96–112)
Creatinine, Ser: 0.88 mg/dL (ref 0.50–1.10)
GFR calc Af Amer: >90 mL/min (ref >90)
GFR calc non Af Amer: 86 mL/min — ABNORMAL LOW (ref >90)
Glucose, Bld: 104 mg/dL — ABNORMAL HIGH (ref 70–99)
POTASSIUM: 3.6 mmol/L (ref 3.5–5.1)
Sodium: 139 mmol/L (ref 135–145)
TOTAL PROTEIN: 6.6 g/dL (ref 6.0–8.3)
Total Bilirubin: 0.2 mg/dL — ABNORMAL LOW (ref 0.3–1.2)

## 2014-09-12 LAB — URINALYSIS, ROUTINE W REFLEX MICROSCOPIC
Bilirubin Urine: NEGATIVE
Glucose, UA: NEGATIVE mg/dL
KETONES UR: NEGATIVE mg/dL
Leukocytes, UA: NEGATIVE
NITRITE: NEGATIVE
Protein, ur: NEGATIVE mg/dL
Specific Gravity, Urine: 1.02 (ref 1.005–1.030)
Urobilinogen, UA: 0.2 mg/dL (ref 0.0–1.0)
pH: 6.5 (ref 5.0–8.0)

## 2014-09-12 LAB — CBC
HCT: 35 % — ABNORMAL LOW (ref 36.0–46.0)
HEMOGLOBIN: 11.9 g/dL — AB (ref 12.0–15.0)
MCH: 30.6 pg (ref 26.0–34.0)
MCHC: 34 g/dL (ref 30.0–36.0)
MCV: 90 fL (ref 78.0–100.0)
PLATELETS: 294 10*3/uL (ref 150–400)
RBC: 3.89 MIL/uL (ref 3.87–5.11)
RDW: 12.4 % (ref 11.5–15.5)
WBC: 8.5 10*3/uL (ref 4.0–10.5)

## 2014-09-12 LAB — URINE MICROSCOPIC-ADD ON

## 2014-09-12 LAB — POC URINE PREG, ED: Preg Test, Ur: NEGATIVE

## 2014-09-12 LAB — LIPASE, BLOOD: Lipase: 19 U/L (ref 11–59)

## 2014-09-12 MED ORDER — FENTANYL CITRATE 0.05 MG/ML IJ SOLN
50.0000 ug | Freq: Once | INTRAMUSCULAR | Status: AC
  Administered 2014-09-12: 50 ug via INTRAVENOUS
  Filled 2014-09-12: qty 2

## 2014-09-12 NOTE — Discharge Instructions (Signed)
As discussed, your pain is likely due to kidney stones.  It is important that you follow-up with both your urologist and primary care physician tomorrow via telephone to arrange appropriate ongoing care.  Return here for concerning changes in her condition.

## 2014-09-12 NOTE — ED Provider Notes (Signed)
CSN: [id number]     Arrival date & time 09/12/14  1925 History  This chart was scribed for Carmin Muskrat, MD by Molli Posey, ED Scribe. This patient was seen in room APA12/APA12 and the patient's care was started 8:29 PM.   Chief Complaint  Patient presents with  . Flank Pain  . Emesis   The history is provided by the patient. No language interpreter was used.   HPI Comments: ZAILAH ZAGAMI is a 34 y.o. female with a history of migraines who presents to the Emergency Department complaining of bilateral lower back for the last week that worsened 3 days ago. Pt reports associated dysuria, vomited once earlier today and has had chills recently. Pt reports she has only been sleeping 2 to 3 hours a night due to her pain. She says that she was using tylenol, hydrocodone and a heating pad which provided her pain relief until 3 days ago. Pt states that she has been dx with kidney stones by her nephrologist in November 2015. Pt states that she is scheduled to be seen at [facility] in 2 days by a urologist. She denies CP and syncope.    Past Medical History  Diagnosis Date  . Migraine    Past Surgical History  Procedure Laterality Date  . Cesarean section     No family history on file. History  Substance Use Topics  . Smoking status: Current Every Day Smoker -- 0.50 packs/day    Types: Cigarettes  . Smokeless tobacco: Never Used  . Alcohol Use: Yes     Comment: occasional   OB History    No data available     Review of Systems  Constitutional: Positive for chills.       Per HPI, otherwise negative  HENT:       Per HPI, otherwise negative  Respiratory:       Per HPI, otherwise negative  Cardiovascular:       Per HPI, otherwise negative  Gastrointestinal: Positive for nausea and vomiting.  Endocrine:       Negative aside from HPI  Genitourinary: Positive for dysuria and flank pain.       Neg aside from HPI   Musculoskeletal:       Per HPI, otherwise negative  Skin:  Negative.   Neurological: Negative for syncope.      Allergies  Morphine and related and Ibuprofen  Home Medications   Prior to Admission medications   Medication Sig Start Date End Date Taking? Authorizing Provider  acetaminophen (TYLENOL) 325 MG tablet Take 650 mg by mouth every 4 (four) hours as needed for headache.    Historical Provider, MD  cephALEXin (KEFLEX) 500 MG capsule Take 1 capsule (500 mg total) by mouth 4 (four) times daily. 06/06/14   Daleen Bo, MD  HYDROcodone-acetaminophen (NORCO) 5-325 MG per tablet Take 1-2 tablets by mouth every 6 (six) hours as needed for moderate pain. 06/21/14   Lonie Peak Dixon, PA-C  metoCLOPramide (REGLAN) 10 MG tablet Take 1 tablet (10 mg total) by mouth every 6 (six) hours as needed for nausea (or headache). 03/31/14   Alycia Rossetti, MD  Multiple Vitamin (MULTIVITAMIN) capsule Take 1 capsule by mouth daily.    Historical Provider, MD  propranolol ER (INDERAL LA) 60 MG 24 hr capsule Take 1 capsule (60 mg total) by mouth daily. For migraines 03/31/14   Alycia Rossetti, MD  SUMAtriptan (IMITREX) 100 MG tablet Take 1 tablet, May repeat in 2 hours if  headache persists or recurs. 07/20/14   Mary B Dixon, PA-C   BP 129/65 mmHg  Pulse 68  Temp(Src) 98.8 F (37.1 C) (Oral)  Resp 20  Ht 5\' 5"  (1.651 m)  Wt 167 lb (75.751 kg)  BMI 27.79 kg/m2  SpO2 100%  LMP 09/04/2014 Physical Exam  Constitutional: She is oriented to person, place, and time. She appears well-developed and well-nourished. No distress.  HENT:  Head: Normocephalic and atraumatic.  Eyes: Conjunctivae and EOM are normal.  Cardiovascular: Normal rate, regular rhythm and normal heart sounds.   Pulmonary/Chest: Effort normal and breath sounds normal. No stridor. No respiratory distress. She has no wheezes. She has no rales.  Abdominal: She exhibits no distension.  Suprapubic pain  Musculoskeletal: She exhibits no edema.  Neurological: She is alert and oriented to person, place,  and time. No cranial nerve deficit.  Skin: Skin is warm and dry.  Psychiatric: She has a normal mood and affect.  Nursing note and vitals reviewed.   ED Course  Procedures   DIAGNOSTIC STUDIES: Oxygen Saturation is 100% on RA, normal by my interpretation.    COORDINATION OF CARE: 8:33 PM Discussed treatment plan with pt at bedside and pt agreed to plan.   Labs Review Labs Reviewed  URINALYSIS, ROUTINE W REFLEX MICROSCOPIC - Abnormal; Notable for the following:    Hgb urine dipstick MODERATE (*)    All other components within normal limits  CBC - Abnormal; Notable for the following:    Hemoglobin 11.9 (*)    HCT 35.0 (*)    All other components within normal limits  COMPREHENSIVE METABOLIC PANEL - Abnormal; Notable for the following:    Glucose, Bld 104 (*)    Total Bilirubin 0.2 (*)    GFR calc non Af Amer 86 (*)    All other components within normal limits  URINE MICROSCOPIC-ADD ON - Abnormal; Notable for the following:    Squamous Epithelial / LPF MANY (*)    Bacteria, UA FEW (*)    All other components within normal limits  LIPASE, BLOOD  POC URINE PREG, ED    11:38 PM Patient sitting upright, vitals remained stable. We discussed all findings, and the patient will follow up tomorrow with her physician for perception for narcotics  MDM   I personally performed the services described in this documentation, which was scribed in my presence. The recorded information has been reviewed and is accurate.   Patient with a history of kidney stones presents with right flank pain. Here the patient is afebrile, hemodynamically stable, with no urinalysis evidence of concurrent infection, no clinical evidence for obstruction or other acute abdominal pathology.  The patient was appropriate for discharge with outpatient follow-up   Carmin Muskrat, MD 09/12/14 2339

## 2014-09-12 NOTE — ED Notes (Signed)
Pt reports she has kidney stones in both kidneys for the past 5 months and they have started moving. Pt c/o chills and emesis.

## 2014-09-18 ENCOUNTER — Ambulatory Visit: Payer: Self-pay | Admitting: Physician Assistant

## 2014-09-27 ENCOUNTER — Encounter: Payer: Self-pay | Admitting: Physician Assistant

## 2014-09-27 ENCOUNTER — Ambulatory Visit: Payer: Self-pay | Admitting: Physician Assistant

## 2014-09-27 ENCOUNTER — Ambulatory Visit (INDEPENDENT_AMBULATORY_CARE_PROVIDER_SITE_OTHER): Payer: BLUE CROSS/BLUE SHIELD | Admitting: Physician Assistant

## 2014-09-27 VITALS — BP 104/64 | HR 76 | Temp 98.3°F | Resp 18 | Wt 170.0 lb

## 2014-09-27 DIAGNOSIS — M778 Other enthesopathies, not elsewhere classified: Secondary | ICD-10-CM

## 2014-09-27 MED ORDER — PREDNISONE 20 MG PO TABS: ORAL_TABLET | ORAL | Status: DC

## 2014-09-28 NOTE — Progress Notes (Signed)
Patient ID: Sally [name] MRN: [id number], DOB: January 03, 1981, 34 y.o. Date of Encounter: 09/28/2014, 11:07 AM    Chief Complaint:  Chief Complaint  Patient presents with  . left wrist pain x 1 day    ? work injury     HPI: 34 y.o. year old white female presents with above complaint. 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 Has to do a constant twisting motion of the wrist when at work. Otherwise no known injury or trauma. States that she was having no problem with the wrist prior to going to work yesterday. She works 7 PM to 7 AM. Last night while at work at about 12:30 she started hurting in the wrist area. This morning after she finished work she saw a knot present. States that she has no history of problems with the left wrist in the past. Says that she is right handed. Says that she uses her left hand to hold packages and objects while she does things with her right hand.     Home Meds:   Outpatient Prescriptions Prior to Visit  Medication Sig Dispense Refill  . acetaminophen (TYLENOL) 325 MG tablet Take 650 mg by mouth every 4 (four) hours as needed for headache.    . LO LOESTRIN FE 1 MG-10 MCG / 10 MCG tablet Take 1 tablet by mouth daily.     . Multiple Vitamin (MULTIVITAMIN) capsule Take 1 capsule by mouth daily.    . propranolol ER (INDERAL LA) 60 MG 24 hr capsule Take 1 capsule (60 mg total) by mouth daily. For migraines 30 capsule 3  . SUMAtriptan (IMITREX) 100 MG tablet Take 1 tablet, May repeat in 2 hours if headache persists or recurs. (Patient taking differently: Take 100 mg by mouth every 2 (two) hours as needed for migraine or headache. Take 1 tablet, May repeat in 2 hours if headache persists or recurs.) 10 tablet 0  . HYDROcodone-acetaminophen (NORCO) 5-325 MG per tablet Take 1-2 tablets by mouth every 6 (six) hours as needed for moderate pain. (Patient not taking: Reported on 09/12/2014) 60 tablet 0  . metoCLOPramide (REGLAN) 10 MG tablet Take 1 tablet (10 mg  total) by mouth every 6 (six) hours as needed for nausea (or headache). (Patient not taking: Reported on 09/12/2014) 30 tablet 1  . cephALEXin (KEFLEX) 500 MG capsule Take 1 capsule (500 mg total) by mouth 4 (four) times daily. (Patient not taking: Reported on 09/12/2014) 28 capsule 0   No facility-administered medications prior to visit.    Allergies:  Allergies  Allergen Reactions  . Morphine And Related Itching  . Ibuprofen Itching and Rash      Review of Systems: See HPI for pertinent ROS. All other ROS negative.    Physical Exam: Blood pressure 104/64, pulse 76, temperature 98.3 F (36.8 C), temperature source Oral, resp. rate 18, weight 170 lb (77.111 kg), last menstrual period 09/04/2014., Body mass index is 28.29 kg/(m^2). General:  WNWD WF. Appears in no acute distress. Neck: Supple. No thyromegaly. No lymphadenopathy. Lungs: Clear bilaterally to auscultation without wheezes, rales, or rhonchi. Breathing is unlabored. Heart: Regular rhythm. No murmurs, rubs, or gallops. Msk:  Strength and tone normal for age. Left Wrist: Very minimal raised area at dorsal ulnar aspect of wrist. O/w inspection normal. ROM normal. Some tenderness with palpation at the ulnar dorsal aspect of the wrist. Extremities/Skin: Warm and dry. No clubbing or cyanosis. No edema. No rashes or suspicious lesions. Neuro: Alert and oriented X  3. Moves all extremities spontaneously. Gait is normal. CNII-XII grossly in tact. Psych:  Responds to questions appropriately with a normal affect.     ASSESSMENT AND PLAN:  34 y.o. year old female with  1. Tendonitis of wrist, left Secondary to repetitive/overuse. We applied a wrist brace today. To wear this while at work and as much as possible. Discussed with her that this is to keep the wrist in neutral position and to avoid motion to the area that we can reduce inflammation to the area. Says that she cannot take off work to rest the area further. Therefore we'll also  treat with prednisone. Told her to ice the area when possible. Follow-up if worsens or does not improve over the next couple weeks. - predniSONE (DELTASONE) 20 MG tablet; Take 3 daily for 2 days, then 2 daily for 2 days, then 1 daily for 2 days.  Dispense: 12 tablet; Refill: 0   Signed, [address] [place], [place], [facility] 09/28/2014 11:07 AM

## 2014-09-29 ENCOUNTER — Encounter: Payer: Self-pay | Admitting: Family Medicine

## 2014-10-30 ENCOUNTER — Encounter: Payer: Self-pay | Admitting: Family Medicine

## 2015-02-27 ENCOUNTER — Encounter (HOSPITAL_COMMUNITY): Payer: Self-pay | Admitting: Emergency Medicine

## 2015-02-27 ENCOUNTER — Emergency Department (HOSPITAL_COMMUNITY)
Admission: EM | Admit: 2015-02-27 | Discharge: 2015-02-28 | Disposition: A | Discharge status: Home / Self Care | Payer: BLUE CROSS/BLUE SHIELD | Attending: Emergency Medicine | Admitting: Emergency Medicine

## 2015-02-27 DIAGNOSIS — Z720 Tobacco use: Secondary | ICD-10-CM | POA: Insufficient documentation

## 2015-02-27 DIAGNOSIS — G4489 Other headache syndrome: Secondary | ICD-10-CM | POA: Insufficient documentation

## 2015-02-27 DIAGNOSIS — Z79899 Other long term (current) drug therapy: Secondary | ICD-10-CM | POA: Insufficient documentation

## 2015-02-27 DIAGNOSIS — K088 Other specified disorders of teeth and supporting structures: Secondary | ICD-10-CM | POA: Insufficient documentation

## 2015-02-27 DIAGNOSIS — K0889 Other specified disorders of teeth and supporting structures: Secondary | ICD-10-CM

## 2015-02-27 MED ORDER — KETOROLAC TROMETHAMINE 30 MG/ML IJ SOLN
30.0000 mg | Freq: Once | INTRAMUSCULAR | Status: AC
  Administered 2015-02-27: 30 mg via INTRAMUSCULAR
  Filled 2015-02-27: qty 1

## 2015-02-27 MED ORDER — PENICILLIN V POTASSIUM 250 MG PO TABS
500.0000 mg | ORAL_TABLET | Freq: Once | ORAL | Status: AC
  Administered 2015-02-27: 500 mg via ORAL
  Filled 2015-02-27: qty 2

## 2015-02-27 MED ORDER — METOCLOPRAMIDE HCL 5 MG/ML IJ SOLN
10.0000 mg | Freq: Once | INTRAMUSCULAR | Status: AC
  Administered 2015-02-27: 10 mg via INTRAMUSCULAR
  Filled 2015-02-27: qty 2

## 2015-02-27 NOTE — ED Provider Notes (Signed)
CSN: [id number]     Arrival date & time 02/27/15  2140 History  This chart was scribed for Ripley Fraise, MD by [name], ED Scribe. This patient was seen in room APA06/APA06 and the patient's care was started at 11:22 PM.     Chief Complaint  Patient presents with  . Dental Pain  . Migraine   Patient is a 34 y.o. female presenting with tooth pain and migraines. The history is provided by the patient. No language interpreter was used.  Dental Pain Location:  Lower Quality:  Aching Severity:  Moderate Onset quality:  Gradual Duration:  3 days Timing:  Constant Progression:  Worsening Chronicity:  New Context: abscess   Relieved by:  None tried Worsened by:  Nothing tried Ineffective treatments:  None tried Associated symptoms: headaches   Associated symptoms: no fever   Risk factors: lack of dental care   Migraine Associated symptoms include headaches. Pertinent negatives include no chest pain.   HPI Comments: Sally [name] is a 34 y.o. female with a PMHx of migraines who presents to the Emergency Department complaining of a aching, painful, draining, right lower dental abscess that popped earlier today.  She also c/o a migraine onset 1 day ago. She reports associated nausea and vomiting, as well as seeing spots but no visual loss. She denies fever, loss of vision, numbness, back pain, and chest pain,. Reports HA similar to prior headaches Past Medical History  Diagnosis Date  . Migraine    Past Surgical History  Procedure Laterality Date  . Cesarean section    . Tubal ligation     No family history on file. Social History  Substance Use Topics  . Smoking status: Current Every Day Smoker -- 0.50 packs/day    Types: Cigarettes  . Smokeless tobacco: Never Used  . Alcohol Use: Yes     Comment: occasional   OB History    No data available     Review of Systems  Constitutional: Negative for fever.  HENT: Positive for dental problem.   Eyes: Positive for  visual disturbance.  Cardiovascular: Negative for chest pain.  Gastrointestinal: Positive for nausea and vomiting.  Musculoskeletal: Negative for back pain.  Neurological: Positive for headaches. Negative for numbness.  All other systems reviewed and are negative.   Allergies  Morphine and related and Ibuprofen  Home Medications   Prior to Admission medications   Medication Sig Start Date End Date Taking? Authorizing Provider  Multiple Vitamin (MULTIVITAMIN) capsule Take 1 capsule by mouth daily.   Yes Historical Provider, MD  propranolol ER (INDERAL LA) 60 MG 24 hr capsule Take 1 capsule (60 mg total) by mouth daily. For migraines 03/31/14  Yes Alycia Rossetti, MD  SUMAtriptan (IMITREX) 100 MG tablet Take 1 tablet, May repeat in 2 hours if headache persists or recurs. Patient taking differently: Take 100 mg by mouth every 2 (two) hours as needed for migraine or headache. Take 1 tablet, May repeat in 2 hours if headache persists or recurs. 07/20/14  Yes Mary B Dixon, PA-C   BP 111/74 mmHg  Pulse 70  Temp(Src) 98.2 F (36.8 C) (Oral)  Resp 16  Ht 5\' 5"  (1.651 m)  Wt 165 lb (74.844 kg)  BMI 27.46 kg/m2  SpO2 100%  LMP 02/17/2015 (Exact Date) Physical Exam  CONSTITUTIONAL: Well developed/well nourished HEAD: Normocephalic/atraumatic EYES: EOMI/PERRL, no nystagmus, no ptosis,  ENMT: Mucous membranes moist, no dental abscess noted, no trismus noted, poor dentition noted NECK: supple no meningeal  signs CV: S1/S2 noted, no murmurs/rubs/gallops noted LUNGS: Lungs are clear to auscultation bilaterally, no apparent distress ABDOMEN: soft, nontender, no rebound or guarding NEURO:Awake/alert, facies symmetric, no arm or leg drift is noted Equal 5/5 strength with shoulder abduction, elbow flex/extension, wrist flex/extension in upper extremities and equal hand grips bilaterally Equal 5/5 strength with hip flexion,knee flex/extension, foot dorsi/plantar flexion Cranial nerves  3/4/5/6/01/12/09/11/12 tested and intact Gait normal without ataxia No past pointing Sensation to light touch intact in all extremities EXTREMITIES: pulses normal, full ROM SKIN: warm, color normal PSYCH: no abnormalities of mood noted, alert and oriented to situation    ED Course  Procedures  DIAGNOSTIC STUDIES: Oxygen Saturation is 100% on RA, normal by my interpretation.    COORDINATION OF CARE: 11:28 PM - Discussed plans to order antibiotics, Reglan, and toradol. Pt advised of plan for treatment and pt agrees.  Medications  penicillin v potassium (VEETID) tablet 500 mg (500 mg Oral Given 02/27/15 2340)  metoCLOPramide (REGLAN) injection 10 mg (10 mg Intramuscular Given 02/27/15 2340)  ketorolac (TORADOL) 30 MG/ML injection 30 mg (30 mg Intramuscular Given 02/27/15 2340)   12:31 AM Pt improved She is ambulatory She is in no distress Reports HA improved Stable for d/c home We discussed strict ER return precautions   MDM   Final diagnoses:  Other headache syndrome  Pain, dental    Nursing notes including past medical history and social history reviewed and considered in documentation   I personally performed the services described in this documentation, which was scribed in my presence. The recorded information has been reviewed and is accurate.      Ripley Fraise, MD 02/28/15 [phone]

## 2015-02-27 NOTE — ED Notes (Signed)
Dental pain - lower Rt jaw started 3 days ago - had pump on gum today that popped earlier today - Migraine started today -

## 2015-02-28 MED ORDER — PENICILLIN V POTASSIUM 500 MG PO TABS: 500.0000 mg | ORAL_TABLET | Freq: Four times a day (QID) | ORAL | Status: AC

## 2015-02-28 NOTE — Discharge Instructions (Signed)
You are having a headache. No specific cause was found today for your headache. It may have been a migraine or other cause of headache. Stress, anxiety, fatigue, and depression are common triggers for headaches. Your headache today does not appear to be life-threatening or require hospitalization, but often the exact cause of headaches is not determined in the emergency department. Therefore, follow-up with your doctor is very important to find out what may have caused your headache, and whether or not you need any further diagnostic testing or treatment. Sometimes headaches can appear benign (not harmful), but then more serious symptoms can develop which should prompt an immediate re-evaluation by your doctor or the emergency department.  SEEK MEDICAL ATTENTION IF:  You develop possible problems with medications prescribed.  The medications don't resolve your headache, if it recurs , or if you have multiple episodes of vomiting or can't take fluids. You have a change from the usual headache.  RETURN IMMEDIATELY IF you develop a sudden, severe headache or confusion, become poorly responsive or faint, develop a fever above 100.65F or problem breathing, have a change in speech, vision, swallowing, or understanding, or develop new weakness, numbness, tingling, incoordination, or have a seizure.    Dental Pain A tooth ache may be caused by cavities (tooth decay). Cavities expose the nerve of the tooth to air and hot or cold temperatures. It may come from an infection or abscess (also called a boil or furuncle) around your tooth. It is also often caused by dental caries (tooth decay). This causes the pain you are having. DIAGNOSIS  Your caregiver can diagnose this problem by exam. TREATMENT   If caused by an infection, it may be treated with medications which kill germs (antibiotics) and pain medications as prescribed by your caregiver. Take medications as directed.  Only take over-the-counter or  prescription medicines for pain, discomfort, or fever as directed by your caregiver.  Whether the tooth ache today is caused by infection or dental disease, you should see your dentist as soon as possible for further care. SEEK MEDICAL CARE IF: The exam and treatment you received today has been provided on an emergency basis only. This is not a substitute for complete medical or dental care. If your problem worsens or new problems (symptoms) appear, and you are unable to meet with your dentist, call or return to this location. SEEK IMMEDIATE MEDICAL CARE IF:   You have a fever.  You develop redness and swelling of your face, jaw, or neck.  You are unable to open your mouth.  You have severe pain uncontrolled by pain medicine. MAKE SURE YOU:   Understand these instructions.  Will watch your condition.  Will get help right away if you are not doing well or get worse. Document Released: 06/23/2005 Document Revised: 09/15/2011 Document Reviewed: 02/09/2008 [facility] Patient Information 2015 [place], [place]. This information is not intended to replace advice given to you by your health care provider. Make sure you discuss any questions you have with your health care provider.

## 2015-03-08 ENCOUNTER — Encounter: Payer: Self-pay | Admitting: *Deleted

## 2015-03-08 ENCOUNTER — Ambulatory Visit (INDEPENDENT_AMBULATORY_CARE_PROVIDER_SITE_OTHER): Payer: BLUE CROSS/BLUE SHIELD | Admitting: Physician Assistant

## 2015-03-08 ENCOUNTER — Encounter: Payer: Self-pay | Admitting: Physician Assistant

## 2015-03-08 VITALS — BP 128/80 | HR 78 | Temp 98.5°F | Resp 16 | Wt 161.0 lb

## 2015-03-08 DIAGNOSIS — G43911 Migraine, unspecified, intractable, with status migrainosus: Secondary | ICD-10-CM

## 2015-03-08 MED ORDER — HYDROCODONE-ACETAMINOPHEN 5-325 MG PO TABS: 1.0000 | ORAL_TABLET | Freq: Four times a day (QID) | ORAL | Status: DC | PRN

## 2015-03-08 MED ORDER — PROMETHAZINE HCL 25 MG PO TABS: 25.0000 mg | ORAL_TABLET | Freq: Four times a day (QID) | ORAL | Status: DC | PRN

## 2015-03-08 NOTE — Progress Notes (Signed)
Patient ID: LAVINE HARGROVE MRN: [id number], DOB: 12-10-1980, 34 y.o. Date of Encounter: 03/08/2015, 12:55 PM    Chief Complaint:  Chief Complaint  Patient presents with  . Migraine    constant migraine since Monday     HPI: 34 y.o. year old white female presents with above.  I remembered that a while back she was coming in frequently with complaints of migraine. Reviewed her chart and those visits were around October 2015. She says that after that she saw a specialist at a headache center. Says that they changed her medications. She does not remember the names of the new medications and we do not have them listed correctly on her current medication list today. She states that also they have her on a gluten-free diet and also she has been eating no hot dogs chicken nuggets and names off other foods that she is avoiding. Also deodorants, avoiding perfumes and she lists off some other items that she is avoiding. Says that this has helped a lot and that her migraines had been well controlled until this migraine. Says that they also have discussed trigger point injections but she really does not want to do those. Says that she had to leave work early last night secondary to this migraine and is missing work today. Otherwise is already scheduled to be off for the upcoming 3 days--- Friday Saturday Sunday. Says that she's been having to sleep in a recliner last night because she was having throbbing in the back of her neck all the way to the front of her face. Says that she is currently having the most intense pain behind the right eye and right temple area. Also photophobia and phonophobia. Some mild nausea but no vomiting. Says that she had no one available to drive her to the appointment today so she had to drive herself to this appt today.     Home Meds:   Outpatient Prescriptions Prior to Visit  Medication Sig Dispense Refill  . Multiple Vitamin (MULTIVITAMIN) capsule Take 1 capsule by  mouth daily.    . propranolol ER (INDERAL LA) 60 MG 24 hr capsule Take 1 capsule (60 mg total) by mouth daily. For migraines 30 capsule 3  . SUMAtriptan (IMITREX) 100 MG tablet Take 1 tablet, May repeat in 2 hours if headache persists or recurs. (Patient taking differently: Take 100 mg by mouth every 2 (two) hours as needed for migraine or headache. Take 1 tablet, May repeat in 2 hours if headache persists or recurs.) 10 tablet 0   No facility-administered medications prior to visit.    Allergies:  Allergies  Allergen Reactions  . Morphine And Related Itching  . Ibuprofen Itching and Rash      Review of Systems: See HPI for pertinent ROS. All other ROS negative.    Physical Exam: Blood pressure 128/80, pulse 78, temperature 98.5 F (36.9 C), temperature source Oral, resp. rate 16, weight 161 lb (73.029 kg), last menstrual period 02/17/2015., Body mass index is 26.79 kg/(m^2). General:  WNWD WF. Appears in no acute distress. Neck: Supple. No thyromegaly. No lymphadenopathy. Lungs: Clear bilaterally to auscultation without wheezes, rales, or rhonchi. Breathing is unlabored. Heart: Regular rhythm. No murmurs, rubs, or gallops. Msk:  Strength and tone normal for age. Extremities/Skin: Warm and dry. Neuro: Alert and oriented X 3. Moves all extremities spontaneously. Gait is normal. CNII-XII grossly in tact. Pupils equal, reactive to light, bilaterally. Romberg normal.  Psych:  Responds to questions appropriately with a  normal affect.     ASSESSMENT AND PLAN:  33 y.o. year old female with  1. Intractable migraine with status migrainosus, unspecified migraine type She is having to drive herself home from appointment, will prescribe hydrocodone orally. She is also requesting refill on medication to use for nausea. Note given for out of work today and for missed work yesterday when she left early. She is already scheduled to be off work tomorrow Saturday and Sunday and is scheduled to  return to work Monday sept 5th. - HYDROcodone-acetaminophen (NORCO/VICODIN) 5-325 MG per tablet; Take 1 tablet by mouth every 6 (six) hours as needed.  Dispense: 30 tablet; Refill: 0 - promethazine (PHENERGAN) 25 MG tablet; Take 1 tablet (25 mg total) by mouth every 6 (six) hours as needed for nausea or vomiting.  Dispense: 30 tablet; Refill: 0   Signed, [address] [place], [place], [facility] 03/08/2015 12:55 PM

## 2015-03-18 ENCOUNTER — Emergency Department (HOSPITAL_COMMUNITY): Payer: BLUE CROSS/BLUE SHIELD

## 2015-03-18 ENCOUNTER — Encounter (HOSPITAL_COMMUNITY): Payer: Self-pay | Admitting: Emergency Medicine

## 2015-03-18 ENCOUNTER — Emergency Department (HOSPITAL_COMMUNITY)
Admission: EM | Admit: 2015-03-18 | Discharge: 2015-03-18 | Disposition: A | Discharge status: Home / Self Care | Payer: BLUE CROSS/BLUE SHIELD | Attending: Emergency Medicine | Admitting: Emergency Medicine

## 2015-03-18 DIAGNOSIS — G43909 Migraine, unspecified, not intractable, without status migrainosus: Secondary | ICD-10-CM | POA: Diagnosis not present

## 2015-03-18 DIAGNOSIS — J40 Bronchitis, not specified as acute or chronic: Secondary | ICD-10-CM

## 2015-03-18 DIAGNOSIS — R51 Headache: Secondary | ICD-10-CM

## 2015-03-18 DIAGNOSIS — J209 Acute bronchitis, unspecified: Secondary | ICD-10-CM | POA: Diagnosis not present

## 2015-03-18 DIAGNOSIS — Z79899 Other long term (current) drug therapy: Secondary | ICD-10-CM | POA: Insufficient documentation

## 2015-03-18 DIAGNOSIS — R519 Headache, unspecified: Secondary | ICD-10-CM

## 2015-03-18 DIAGNOSIS — Z720 Tobacco use: Secondary | ICD-10-CM | POA: Diagnosis not present

## 2015-03-18 LAB — CBC WITH DIFFERENTIAL/PLATELET
BASOS PCT: 0 % (ref 0–1)
Basophils Absolute: 0 10*3/uL (ref 0.0–0.1)
EOS PCT: 2 % (ref 0–5)
Eosinophils Absolute: 0.2 10*3/uL (ref 0.0–0.7)
HCT: 33.9 % — ABNORMAL LOW (ref 36.0–46.0)
HEMOGLOBIN: 11.5 g/dL — AB (ref 12.0–15.0)
LYMPHS ABS: 3.4 10*3/uL (ref 0.7–4.0)
Lymphocytes Relative: 36 % (ref 12–46)
MCH: 30.7 pg (ref 26.0–34.0)
MCHC: 33.9 g/dL (ref 30.0–36.0)
MCV: 90.6 fL (ref 78.0–100.0)
MONO ABS: 0.7 10*3/uL (ref 0.1–1.0)
MONOS PCT: 8 % (ref 3–12)
NEUTROS PCT: 54 % (ref 43–77)
Neutro Abs: 5.3 10*3/uL (ref 1.7–7.7)
Platelets: 254 10*3/uL (ref 150–400)
RBC: 3.74 MIL/uL — ABNORMAL LOW (ref 3.87–5.11)
RDW: 12.9 % (ref 11.5–15.5)
WBC: 9.6 10*3/uL (ref 4.0–10.5)

## 2015-03-18 LAB — BASIC METABOLIC PANEL
Anion gap: 5 (ref 5–15)
BUN: 14 mg/dL (ref 6–20)
CALCIUM: 8.7 mg/dL — AB (ref 8.9–10.3)
CHLORIDE: 108 mmol/L (ref 101–111)
CO2: 25 mmol/L (ref 22–32)
Creatinine, Ser: 0.56 mg/dL (ref 0.44–1.00)
GFR calc Af Amer: >60 mL/min (ref >60)
GFR calc non Af Amer: >60 mL/min (ref >60)
Glucose, Bld: 101 mg/dL — ABNORMAL HIGH (ref 65–99)
Potassium: 3.7 mmol/L (ref 3.5–5.1)
Sodium: 138 mmol/L (ref 135–145)

## 2015-03-18 LAB — TROPONIN I: Troponin I: <0.03 ng/mL (ref <0.031)

## 2015-03-18 LAB — D-DIMER, QUANTITATIVE (NOT AT ARMC): D-Dimer, Quant: 0.31 ug/mL-FEU (ref 0.00–0.48)

## 2015-03-18 MED ORDER — TRAMADOL HCL 50 MG PO TABS: 50.0000 mg | ORAL_TABLET | Freq: Four times a day (QID) | ORAL | Status: DC | PRN

## 2015-03-18 MED ORDER — HYDROMORPHONE HCL 1 MG/ML IJ SOLN
1.0000 mg | Freq: Once | INTRAMUSCULAR | Status: AC
  Administered 2015-03-18: 1 mg via INTRAMUSCULAR
  Filled 2015-03-18: qty 1

## 2015-03-18 MED ORDER — OXYCODONE-ACETAMINOPHEN 5-325 MG PO TABS
1.0000 | ORAL_TABLET | Freq: Once | ORAL | Status: AC
  Administered 2015-03-18: 1 via ORAL
  Filled 2015-03-18: qty 1

## 2015-03-18 MED ORDER — AZITHROMYCIN 250 MG PO TABS: ORAL_TABLET | ORAL | Status: DC

## 2015-03-18 NOTE — Discharge Instructions (Signed)
Follow up with your md if not improving. °

## 2015-03-18 NOTE — ED Notes (Addendum)
Patient c/o headache that started on Friday and then started having chest pain last night with numbness in right hand, which stopped. Patient states "Numbness is gone but chest pain started back with shortness of breath today." Denies any nausea or vomiting. Per patient has hx of migraine in which this feels similar but has never had the chest chest pain with a migraine. Patient reports that she was running a fever earlier today as well of 101 in which she took some tylenol at 9:30am and again at 3"30pm for headache.

## 2015-03-18 NOTE — ED Provider Notes (Signed)
CSN: [id number]     Arrival date & time 03/18/15  1752 History   First MD Initiated Contact with Patient 03/18/15 1832     Chief Complaint  Patient presents with  . Migraine  . Chest Pain     (Consider location/radiation/quality/duration/timing/severity/associated sxs/prior Treatment) Patient is a 34 y.o. female presenting with migraines and chest pain. The history is provided by the patient (The patient complains of chest pain with inspiration. Cough fever chills and a headache.).  Migraine This is a new problem. The current episode started 1 to 2 hours ago. The problem occurs constantly. The problem has not changed since onset.Associated symptoms include chest pain. Pertinent negatives include no abdominal pain and no headaches. Nothing aggravates the symptoms. Nothing relieves the symptoms.  Chest Pain Associated symptoms: no abdominal pain, no back pain, no cough, no fatigue and no headache     Past Medical History  Diagnosis Date  . Migraine    Past Surgical History  Procedure Laterality Date  . Cesarean section    . Tubal ligation     Family History  Problem Relation Age of Onset  . Stroke Father   . Heart failure Father   . Diabetes Father    Social History  Substance Use Topics  . Smoking status: Current Every Day Smoker -- 0.50 packs/day    Types: Cigarettes  . Smokeless tobacco: Never Used  . Alcohol Use: Yes     Comment: occasional   OB History    Gravida Para Term Preterm AB TAB SAB Ectopic Multiple Living   3 3 3       3      Review of Systems  Constitutional: Negative for appetite change and fatigue.  HENT: Negative for congestion, ear discharge and sinus pressure.   Eyes: Negative for discharge.  Respiratory: Negative for cough.   Cardiovascular: Positive for chest pain.  Gastrointestinal: Negative for abdominal pain and diarrhea.  Genitourinary: Negative for frequency and hematuria.  Musculoskeletal: Negative for back pain.  Skin: Negative for  rash.  Neurological: Negative for seizures and headaches.  Psychiatric/Behavioral: Negative for hallucinations.  www.    Allergies  Morphine and related and Ibuprofen  Home Medications   Prior to Admission medications   Medication Sig Start Date End Date Taking? Authorizing Provider  acetaminophen (TYLENOL) 500 MG tablet Take 1,000 mg by mouth every 6 (six) hours as needed for mild pain.   Yes Historical Provider, MD  HYDROcodone-acetaminophen (NORCO/VICODIN) 5-325 MG per tablet Take 1 tablet by mouth every 6 (six) hours as needed. Patient taking differently: Take 1 tablet by mouth every 6 (six) hours as needed for moderate pain.  03/08/15  Yes Orlena Sheldon, PA-C  Multiple Vitamin (MULTIVITAMIN WITH MINERALS) TABS tablet Take 1 tablet by mouth daily.   Yes Historical Provider, MD  promethazine (PHENERGAN) 25 MG tablet Take 1 tablet (25 mg total) by mouth every 6 (six) hours as needed for nausea or vomiting. 03/08/15  Yes Mary B Dixon, PA-C  propranolol ER (INDERAL LA) 60 MG 24 hr capsule Take 1 capsule (60 mg total) by mouth daily. For migraines 03/31/14  Yes Alycia Rossetti, MD  SUMAtriptan (IMITREX) 100 MG tablet Take 1 tablet, May repeat in 2 hours if headache persists or recurs. Patient taking differently: Take 100 mg by mouth every 2 (two) hours as needed for migraine or headache. Take 1 tablet, May repeat in 2 hours if headache persists or recurs. 07/20/14  Yes Orlena Sheldon, PA-C  azithromycin (  ZITHROMAX Z-PAK) 250 MG tablet 2 po day one, then 1 daily x 4 days 03/18/15   Milton Ferguson, MD  traMADol (ULTRAM) 50 MG tablet Take 1 tablet (50 mg total) by mouth every 6 (six) hours as needed for moderate pain. 03/18/15   Milton Ferguson, MD   BP 114/68 mmHg  Pulse 74  Temp(Src) 97.6 F (36.4 C) (Oral)  Resp 23  Ht 5\' 5"  (1.651 m)  Wt 166 lb (75.297 kg)  BMI 27.62 kg/m2  SpO2 98%  LMP 03/18/2015 Physical Exam  Constitutional: She is oriented to person, place, and time. She appears  well-developed.  HENT:  Head: Normocephalic.  Eyes: Conjunctivae and EOM are normal. No scleral icterus.  Neck: Neck supple. No thyromegaly present.  Cardiovascular: Normal rate and regular rhythm.  Exam reveals no gallop and no friction rub.   No murmur heard. Pulmonary/Chest: No stridor. She has no wheezes. She has no rales. She exhibits no tenderness.  Abdominal: She exhibits no distension. There is no tenderness. There is no rebound.  Musculoskeletal: Normal range of motion. She exhibits no edema.  Lymphadenopathy:    She has no cervical adenopathy.  Neurological: She is oriented to person, place, and time. She exhibits normal muscle tone. Coordination normal.  Skin: No rash noted. No erythema.  Psychiatric: She has a normal mood and affect. Her behavior is normal.    ED Course  Procedures (including critical care time) Labs Review Labs Reviewed  CBC WITH DIFFERENTIAL/PLATELET - Abnormal; Notable for the following:    RBC 3.74 (*)    Hemoglobin 11.5 (*)    HCT 33.9 (*)    All other components within normal limits  BASIC METABOLIC PANEL - Abnormal; Notable for the following:    Glucose, Bld 101 (*)    Calcium 8.7 (*)    All other components within normal limits  D-DIMER, QUANTITATIVE (NOT AT [facility])  TROPONIN I    Imaging Review Dg Chest 2 View  03/18/2015   CLINICAL DATA:  Onset of headache on Friday. Chest pain. Numbness in the RIGHT hand. Initial encounter.  EXAM: CHEST  2 VIEW  COMPARISON:  08/30/2004.  FINDINGS: Cardiopericardial silhouette within normal limits. Mediastinal contours normal. Trachea midline. No airspace disease or effusion. Monitoring leads project over the chest.  IMPRESSION: No active cardiopulmonary disease.   Electronically Signed   By: Dereck Ligas M.D.   On: 03/18/2015 19:45   I have personally reviewed and evaluated these images and lab results as part of my medical decision-making.   EKG Interpretation   Date/Time:  Sunday March 18 2015  18:10:54 EDT Ventricular Rate:  72 PR Interval:  163 QRS Duration: 82 QT Interval:  407 QTC Calculation: 445 R Axis:   88 Text Interpretation:  Sinus rhythm Low voltage, precordial leads RSR' in  V1 or V2, probably normal variant Borderline T abnormalities, anterior  leads Confirmed by Sanjana Folz  MD, Yisrael Obryan [phone]) on 03/18/2015 9:08:04 PM      MDM   Final diagnoses:  Bronchitis  Headache disorder    Labs and x-ray and EKG unremarkable including normal troponin and normal D dimer. Suspect patient has bronchitis we'll treat with Z-Pak and Ultram for pain.    Milton Ferguson, MD 03/18/15 2128

## 2015-04-19 ENCOUNTER — Encounter: Payer: Self-pay | Admitting: Family Medicine

## 2015-04-19 ENCOUNTER — Ambulatory Visit (INDEPENDENT_AMBULATORY_CARE_PROVIDER_SITE_OTHER): Payer: BLUE CROSS/BLUE SHIELD | Admitting: Physician Assistant

## 2015-04-19 ENCOUNTER — Ambulatory Visit: Payer: BLUE CROSS/BLUE SHIELD | Admitting: Physician Assistant

## 2015-04-19 ENCOUNTER — Encounter: Payer: Self-pay | Admitting: Physician Assistant

## 2015-04-19 VITALS — BP 102/66 | HR 74 | Temp 98.2°F | Resp 18 | Wt 168.0 lb

## 2015-04-19 DIAGNOSIS — M542 Cervicalgia: Secondary | ICD-10-CM

## 2015-04-19 DIAGNOSIS — G44209 Tension-type headache, unspecified, not intractable: Secondary | ICD-10-CM | POA: Diagnosis not present

## 2015-04-19 MED ORDER — CYCLOBENZAPRINE HCL 10 MG PO TABS: 10.0000 mg | ORAL_TABLET | Freq: Three times a day (TID) | ORAL | Status: DC | PRN

## 2015-04-19 MED ORDER — TRAMADOL HCL 50 MG PO TABS: 50.0000 mg | ORAL_TABLET | Freq: Four times a day (QID) | ORAL | Status: DC | PRN

## 2015-04-19 MED ORDER — KETOROLAC TROMETHAMINE 60 MG/2ML IM SOLN
60.0000 mg | Freq: Once | INTRAMUSCULAR | Status: AC
  Administered 2015-04-19: 60 mg via INTRAMUSCULAR

## 2015-04-19 MED ORDER — DIAZEPAM 2 MG PO TABS: 2.0000 mg | ORAL_TABLET | Freq: Four times a day (QID) | ORAL | Status: DC | PRN

## 2015-04-19 NOTE — Progress Notes (Signed)
Patient ID: Sally [name] MRN: [id number], DOB: 1981/04/15, 34 y.o. Date of Encounter: 04/19/2015, 4:42 PM    Chief Complaint:  Chief Complaint  Patient presents with  . Headache     HPI: 34 y.o. year old white female presents with a female friend with her today. Says that this headache started Saturday 04/14/15. Says that at that time she did not have access to any medications. Says that her uncle gave her 2 Valiums, hoping that that would ease her pain so that she would be able to go to work as scheduled. Says that this medicine actually worked very well for her and did not make her sleepy or groggy so she was able to go to work. However ,says that she was only able to stay at work for 6 hours, then her neck was hurting so bad she had to leave. Went home, took a muscle relaxer, and went to bed. Says that she's been having pain in her shoulders neck and headache. Says that she is out of her migraine meds and almost out of muscle relaxer. Also says that the Valium worked very well for her and that she was able to take this and still go to work-- where as, if she takes the muscle relaxer, it makes her fall asleep and cannot go to work when she takes that. Repeatedly requests Rx for Valium to have available to use if needed.  Then again I was ready to exit the room and she asked for "a shot". I told her that I was planning to give her prescriptions for the medicines that we had already discussed and she says that she also really needs a injection while she is here to help get relief for this current headache. Also says that that the whole reason she had this friend come with her so that she would be available to drive her home.     Home Meds:   Outpatient Prescriptions Prior to Visit  Medication Sig Dispense Refill  . acetaminophen (TYLENOL) 500 MG tablet Take 1,000 mg by mouth every 6 (six) hours as needed for mild pain.    Marland Kitchen azithromycin (ZITHROMAX Z-PAK) 250 MG tablet 2 po day one,  then 1 daily x 4 days 5 tablet 0  . HYDROcodone-acetaminophen (NORCO/VICODIN) 5-325 MG per tablet Take 1 tablet by mouth every 6 (six) hours as needed. (Patient taking differently: Take 1 tablet by mouth every 6 (six) hours as needed for moderate pain. ) 30 tablet 0  . Multiple Vitamin (MULTIVITAMIN WITH MINERALS) TABS tablet Take 1 tablet by mouth daily.    . promethazine (PHENERGAN) 25 MG tablet Take 1 tablet (25 mg total) by mouth every 6 (six) hours as needed for nausea or vomiting. 30 tablet 0  . propranolol ER (INDERAL LA) 60 MG 24 hr capsule Take 1 capsule (60 mg total) by mouth daily. For migraines 30 capsule 3  . SUMAtriptan (IMITREX) 100 MG tablet Take 1 tablet, May repeat in 2 hours if headache persists or recurs. (Patient taking differently: Take 100 mg by mouth every 2 (two) hours as needed for migraine or headache. Take 1 tablet, May repeat in 2 hours if headache persists or recurs.) 10 tablet 0  . traMADol (ULTRAM) 50 MG tablet Take 1 tablet (50 mg total) by mouth every 6 (six) hours as needed for moderate pain. 15 tablet 0   No facility-administered medications prior to visit.    Allergies:  Allergies  Allergen Reactions  . Morphine  And Related Itching  . Ibuprofen Itching and Rash      Review of Systems: See HPI for pertinent ROS. All other ROS negative.    Physical Exam: Blood pressure 102/66, pulse 74, temperature 98.2 F (36.8 C), resp. rate 18, weight 168 lb (76.204 kg)., There is no weight on file to calculate BMI. General:  WNWD WF. In exam room with lights off, wearing sunglasses. Appears in no acute distress. HEENT: Normocephalic, atraumatic, eyes without discharge, sclera non-icteric, nares are without discharge. Bilateral auditory canals clear, TM's are without perforation, pearly grey and translucent with reflective cone of light bilaterally. Oral cavity moist, posterior pharynx without exudate, erythema, peritonsillar abscess, or post nasal drip.  Neck: Supple.  No thyromegaly. No lymphadenopathy. Reports severe tenderness with palpation along both sides of her neck and periscapular regions. Lungs: Clear bilaterally to auscultation without wheezes, rales, or rhonchi. Breathing is unlabored. Heart: Regular rhythm. No murmurs, rubs, or gallops. Msk:  Strength and tone normal for age. Extremities/Skin: Warm and dry.  Neuro: Alert and oriented X 3. Moves all extremities spontaneously. Gait is normal. CNII-XII grossly in tact. Romberg normal. Psych:  Responds to questions appropriately with a normal affect.     ASSESSMENT AND PLAN:  34 y.o. year old female with  1. Tension headache - cyclobenzaprine (FLEXERIL) 10 MG tablet; Take 1 tablet (10 mg total) by mouth 3 (three) times daily as needed for muscle spasms.  Dispense: 60 tablet; Refill: 1 - traMADol (ULTRAM) 50 MG tablet; Take 1 tablet (50 mg total) by mouth every 6 (six) hours as needed for moderate pain.  Dispense: 30 tablet; Refill: 0 - diazepam (VALIUM) 2 MG tablet; Take 1 tablet (2 mg total) by mouth every 6 (six) hours as needed for anxiety.  Dispense: 30 tablet; Refill: 0 - ketorolac (TORADOL) injection 60 mg; Inject 2 mLs (60 mg total) into the muscle once.  2. Neck pain, musculoskeletal - cyclobenzaprine (FLEXERIL) 10 MG tablet; Take 1 tablet (10 mg total) by mouth 3 (three) times daily as needed for muscle spasms.  Dispense: 60 tablet; Refill: 1 - traMADol (ULTRAM) 50 MG tablet; Take 1 tablet (50 mg total) by mouth every 6 (six) hours as needed for moderate pain.  Dispense: 30 tablet; Refill: 0 - diazepam (VALIUM) 2 MG tablet; Take 1 tablet (2 mg total) by mouth every 6 (six) hours as needed for anxiety.  Dispense: 30 tablet; Refill: 0 - ketorolac (TORADOL) injection 60 mg; Inject 2 mLs (60 mg total) into the muscle once.  She is aware not to take the Flexeril and the Valium within 8 hours of each other. She also is aware not to take tramadol on the same day that she takes any  triptan  Signed, Olean Ree Nikolai, [place], [facility] 04/19/2015 4:42 PM

## 2015-05-01 ENCOUNTER — Encounter (HOSPITAL_COMMUNITY): Payer: Self-pay | Admitting: Emergency Medicine

## 2015-05-01 ENCOUNTER — Observation Stay (HOSPITAL_COMMUNITY)
Admission: EM | Admit: 2015-05-01 | Discharge: 2015-05-03 | Disposition: A | Discharge status: Home / Self Care | Payer: BLUE CROSS/BLUE SHIELD | Attending: Family Medicine | Admitting: Family Medicine

## 2015-05-01 ENCOUNTER — Emergency Department (HOSPITAL_COMMUNITY): Payer: BLUE CROSS/BLUE SHIELD

## 2015-05-01 DIAGNOSIS — T481X1A Poisoning by skeletal muscle relaxants [neuromuscular blocking agents], accidental (unintentional), initial encounter: Secondary | ICD-10-CM | POA: Diagnosis not present

## 2015-05-01 DIAGNOSIS — R4182 Altered mental status, unspecified: Secondary | ICD-10-CM | POA: Diagnosis present

## 2015-05-01 DIAGNOSIS — G43919 Migraine, unspecified, intractable, without status migrainosus: Secondary | ICD-10-CM | POA: Insufficient documentation

## 2015-05-01 DIAGNOSIS — Y9289 Other specified places as the place of occurrence of the external cause: Secondary | ICD-10-CM | POA: Insufficient documentation

## 2015-05-01 DIAGNOSIS — Z886 Allergy status to analgesic agent status: Secondary | ICD-10-CM | POA: Diagnosis not present

## 2015-05-01 DIAGNOSIS — T424X4A Poisoning by benzodiazepines, undetermined, initial encounter: Secondary | ICD-10-CM

## 2015-05-01 DIAGNOSIS — Z885 Allergy status to narcotic agent status: Secondary | ICD-10-CM | POA: Insufficient documentation

## 2015-05-01 DIAGNOSIS — G9340 Encephalopathy, unspecified: Principal | ICD-10-CM | POA: Diagnosis present

## 2015-05-01 DIAGNOSIS — T50901A Poisoning by unspecified drugs, medicaments and biological substances, accidental (unintentional), initial encounter: Secondary | ICD-10-CM | POA: Diagnosis present

## 2015-05-01 MED ORDER — SODIUM CHLORIDE 0.9 % IV BOLUS (SEPSIS)
1000.0000 mL | Freq: Once | INTRAVENOUS | Status: AC
  Administered 2015-05-01: 1000 mL via INTRAVENOUS

## 2015-05-01 NOTE — ED Provider Notes (Signed)
CSN: [id number]     Arrival date & time 05/01/15  2326 History  By signing my name below, I, Sally [name], attest that this documentation has been prepared under the direction and in the presence of Sally Porter, MD at 2338. Electronically Signed: Helane [name], ED Scribe. 05/02/2015. 12:50 AM.    Chief Complaint  Patient presents with  . Drug Overdose   The history is provided by the spouse. No language interpreter was used.   HPI Comments: Level 5 Caveat for unresponsiveness Sally [name] is a 34 y.o. female who presents to the Emergency Department complaining of a drug overdose that occurred 45 minutes ago. He states pt took muscle relaxant, but does not know how much, as the entire bottle was empty. He notes this medication was prescribed to her 2 weeks ago, and that there were 15-10 pills in it at that time. Per husband, pt was suddenly unresponsive in the living room while watching TV, after having gone to the bathroom. Per husband, pt was last normal 3 hours ago or around 9 pm. He denies pt ever having done something like this before. He states pt has no history of depression and that they have had no recent fights. He states she has no recent stressors out of the norm. He is not aware of any issues at the pt's job. He states pt is on medication for 3 or 4 different migraine medications. He states pt smokes 1 ppd. He denies pt drinking alcohol or complaining of a HA. He reports pt having a FHx of stroke (father) at an older age.   PCP Dr Dennard Schaumann  Past Medical History  Diagnosis Date  . Migraine    Past Surgical History  Procedure Laterality Date  . Cesarean section    . Tubal ligation     Family History  Problem Relation Age of Onset  . Stroke Father   . Heart failure Father   . Diabetes Father    Social History  Substance Use Topics  . Smoking status: Current Every Day Smoker -- 0.50 packs/day    Types: Cigarettes  . Smokeless tobacco: Never Used  . Alcohol Use: Yes      Comment: occasional   Employed Lives with significant other Smokes 1 ppd  OB History    Gravida Para Term Preterm AB TAB SAB Ectopic Multiple Living   3 3 3       3      Review of Systems  Unable to perform ROS: Patient unresponsive    Allergies  Morphine and related and Ibuprofen  Home Medications   Prior to Admission medications   Medication Sig Start Date End Date Taking? Authorizing Provider  acetaminophen (TYLENOL) 500 MG tablet Take 1,000 mg by mouth every 6 (six) hours as needed for mild pain.    Historical Provider, MD  azithromycin (ZITHROMAX Z-PAK) 250 MG tablet 2 po day one, then 1 daily x 4 days 03/18/15   Sally Ferguson, MD  cyclobenzaprine (FLEXERIL) 10 MG tablet Take 1 tablet (10 mg total) by mouth 3 (three) times daily as needed for muscle spasms. 04/19/15   Sally Peak Dixon, PA-C  diazepam (VALIUM) 2 MG tablet Take 1 tablet (2 mg total) by mouth every 6 (six) hours as needed for anxiety. 04/19/15   Sally Sheldon, PA-C  HYDROcodone-acetaminophen (NORCO/VICODIN) 5-325 MG per tablet Take 1 tablet by mouth every 6 (six) hours as needed. Patient taking differently: Take 1 tablet by mouth every 6 (six) hours as  needed for moderate pain.  03/08/15   Sally Sheldon, PA-C  Multiple Vitamin (MULTIVITAMIN WITH MINERALS) TABS tablet Take 1 tablet by mouth daily.    Historical Provider, MD  promethazine (PHENERGAN) 25 MG tablet Take 1 tablet (25 mg total) by mouth every 6 (six) hours as needed for nausea or vomiting. 03/08/15   Sally Peak Dixon, PA-C  propranolol ER (INDERAL LA) 60 MG 24 hr capsule Take 1 capsule (60 mg total) by mouth daily. For migraines 03/31/14   Sally Rossetti, MD  SUMAtriptan (IMITREX) 100 MG tablet Take 1 tablet, May repeat in 2 hours if headache persists or recurs. Patient taking differently: Take 100 mg by mouth every 2 (two) hours as needed for migraine or headache. Take 1 tablet, May repeat in 2 hours if headache persists or recurs. 07/20/14   Sally Peak Dixon, PA-C   traMADol (ULTRAM) 50 MG tablet Take 1 tablet (50 mg total) by mouth every 6 (six) hours as needed for moderate pain. 04/19/15   Sally Peak Dixon, PA-C   BP 111/80 mmHg  Pulse 115  Temp(Src) 98.1 F (36.7 C) (Oral)  Resp 16  Ht 5\' 7"  (1.702 m)  Wt 160 lb (72.576 kg)  BMI 25.05 kg/m2  SpO2 96%  LMP   Vital signs normal except for tachycardia  Physical Exam  Constitutional: She appears well-developed and well-nourished.  Non-toxic appearance. She does not appear ill. No distress.  HENT:  Head: Normocephalic and atraumatic.  Right Ear: External ear normal.  Left Ear: External ear normal.  Nose: Nose normal. No mucosal edema or rhinorrhea.  Mouth/Throat: Oropharynx is clear and moist and mucous membranes are normal. No dental abscesses or uvula swelling.  Eyes: Conjunctivae and EOM are normal. Pupils are equal, round, and reactive to light.  Pupils are 3 mm and reactive  Neck: Normal range of motion and full passive range of motion without pain. Neck supple.  Cardiovascular: Regular rhythm and normal heart sounds.  Tachycardia present.  Exam reveals no gallop and no friction rub.   No murmur heard. tachycardia  Pulmonary/Chest: Effort normal and breath sounds normal. No respiratory distress. She has no wheezes. She has no rhonchi. She has no rales. She exhibits no tenderness and no crepitus.  Abdominal: Soft. Normal appearance and bowel sounds are normal. She exhibits no distension. There is no tenderness. There is no rebound and no guarding.  Musculoskeletal: Normal range of motion. She exhibits no edema or tenderness.  Moves all extremities well.   Neurological: No cranial nerve deficit.  Unable to test motor strength, pt's legs are flaccid, when I drop her hand over her face, she avoids hitting her face, she does awaken briefly to sternal rub or saying her name loudly, but does not hold conversation  Skin: Skin is warm, dry and intact. No rash noted. No erythema. No pallor.   Psychiatric: She is slowed. She is noncommunicative.  Nursing note and vitals reviewed.   ED Course  Procedures   Medications  sodium chloride 0.9 % bolus 1,000 mL (0 mLs Intravenous Stopped 05/02/15 0125)    DIAGNOSTIC STUDIES: Oxygen Saturation is 96% on RA, adequate by my interpretation.    COORDINATION OF CARE: 11:51 PM - Discussed plans to monitor pt closely for respiratory distress as well as to order diagnostic studies and imaging. Spouse advised of plan for treatment and spouse agrees. Due to patient's history of headaches a CT scan of the head was done to make sure she did not have  a subarachnoid hemorrhage to account for her altered mental status.  12:49 AM - Re-checked pt. Per husband pt has not woken up or said anything, but has been moving around a little. Discussed normal lab results, except for benzodiazepines. Discussed plans to continue to observe. Spouse advised of plan for treatment and spouse agrees.  03:00 AM pt sleeping, responds to sternal rub by moving her head, still no verbal response. Husband at bedside.   5:30 AM patient sleeping, when I do sternal rub she opens her eyes and looks at me and starts saying "leave me alone" and swings her arms trying to hit me. She appears to be confused. She will not answer questions or is unable to carry a conversation. She appears to be slowly improving from her suspected benzodiazepine overdose. Hopefully with in the next 1-1/2-2 hours she will be able to hold a conversation. At that point we can try to have a TSS evaluation to see if this benzodiazepine ingestion was intentional to harm herself or accidental.  07:00 AM pt is more easily awakened and stays awake, but mumbles. She is making eye contact now. Still tries to swing at staff. Does not seem to know where she is.  She is slowly improving.  Pt left at change of shift, will need observation alittle longer.   Labs Review Results for orders placed or performed during  the hospital encounter of 05/01/15  Ethanol  Result Value Ref Range   Alcohol, Ethyl (B) <5 <5 mg/dL  Comprehensive metabolic panel  Result Value Ref Range   Sodium 139 135 - 145 mmol/L   Potassium 3.5 3.5 - 5.1 mmol/L   Chloride 106 101 - 111 mmol/L   CO2 25 22 - 32 mmol/L   Glucose, Bld 89 65 - 99 mg/dL   BUN 13 6 - 20 mg/dL   Creatinine, Ser 0.73 0.44 - 1.00 mg/dL   Calcium 8.9 8.9 - 10.3 mg/dL   Total Protein 6.4 (L) 6.5 - 8.1 g/dL   Albumin 4.1 3.5 - 5.0 g/dL   AST 21 15 - 41 U/L   ALT 14 14 - 54 U/L   Alkaline Phosphatase 60 38 - 126 U/L   Total Bilirubin 0.5 0.3 - 1.2 mg/dL   GFR calc non Af Amer >60 >60 mL/min   GFR calc Af Amer >60 >60 mL/min   Anion gap 8 5 - 15  CBC with Differential  Result Value Ref Range   WBC 8.9 4.0 - 10.5 K/uL   RBC 4.20 3.87 - 5.11 MIL/uL   Hemoglobin 13.1 12.0 - 15.0 g/dL   HCT 37.8 36.0 - 46.0 %   MCV 90.0 78.0 - 100.0 fL   MCH 31.2 26.0 - 34.0 pg   MCHC 34.7 30.0 - 36.0 g/dL   RDW 12.2 11.5 - 15.5 %   Platelets 254 150 - 400 K/uL   Neutrophils Relative % 45 %   Neutro Abs 4.1 1.7 - 7.7 K/uL   Lymphocytes Relative 43 %   Lymphs Abs 3.8 0.7 - 4.0 K/uL   Monocytes Relative 9 %   Monocytes Absolute 0.8 0.1 - 1.0 K/uL   Eosinophils Relative 3 %   Eosinophils Absolute 0.3 0.0 - 0.7 K/uL   Basophils Relative 0 %   Basophils Absolute 0.0 0.0 - 0.1 K/uL  Acetaminophen level  Result Value Ref Range   Acetaminophen (Tylenol), Serum <10 (L) 10 - 30 ug/mL  Salicylate level  Result Value Ref Range   Salicylate Lvl <3.4  2.8 - 30.0 mg/dL  Urinalysis, Routine w reflex microscopic  Result Value Ref Range   Color, Urine STRAW (A) YELLOW   APPearance CLEAR CLEAR   Specific Gravity, Urine <1.005 (L) 1.005 - 1.030   pH 6.5 5.0 - 8.0   Glucose, UA NEGATIVE NEGATIVE mg/dL   Hgb urine dipstick NEGATIVE NEGATIVE   Bilirubin Urine NEGATIVE NEGATIVE   Ketones, ur NEGATIVE NEGATIVE mg/dL   Protein, ur NEGATIVE NEGATIVE mg/dL   Urobilinogen, UA 0.2  0.0 - 1.0 mg/dL   Nitrite NEGATIVE NEGATIVE   Leukocytes, UA NEGATIVE NEGATIVE  Urine rapid drug screen (hosp performed)  Result Value Ref Range   Opiates NONE DETECTED NONE DETECTED   Cocaine NONE DETECTED NONE DETECTED   Benzodiazepines POSITIVE (A) NONE DETECTED   Amphetamines NONE DETECTED NONE DETECTED   Tetrahydrocannabinol NONE DETECTED NONE DETECTED   Barbiturates NONE DETECTED NONE DETECTED  Pregnancy, urine  Result Value Ref Range   Preg Test, Ur NEGATIVE NEGATIVE     Laboratory interpretation all normal except positive UDS for benzodiazepines   Imaging Review Ct Head Wo Contrast  05/02/2015  CLINICAL DATA:  Acute onset of altered level of consciousness after drug overdose. Initial encounter. EXAM: CT HEAD WITHOUT CONTRAST TECHNIQUE: Contiguous axial images were obtained from the base of the skull through the vertex without intravenous contrast. COMPARISON:  CT of the head performed 05/12/2014 FINDINGS: There is no evidence of acute infarction, mass lesion, or intra- or extra-axial hemorrhage on CT. The posterior fossa, including the cerebellum, brainstem and fourth ventricle, is within normal limits. The third and lateral ventricles, and basal ganglia are unremarkable in appearance. The cerebral hemispheres are symmetric in appearance, with normal gray-white differentiation. No mass effect or midline shift is seen. There is no evidence of fracture; visualized osseous structures are unremarkable in appearance. The visualized portions of the orbits are within normal limits. The paranasal sinuses and mastoid air cells are well-aerated. No significant soft tissue abnormalities are seen. IMPRESSION: Unremarkable noncontrast CT of the head. Electronically Signed   By: Garald Balding M.D.   On: 05/02/2015 00:26   Dg Chest Port 1 View  05/02/2015  CLINICAL DATA:  Acute onset of altered mental status. Status post overdose. Initial encounter. EXAM: PORTABLE CHEST 1 VIEW COMPARISON:  Chest  radiograph performed 03/18/2015 FINDINGS: The lungs are mildly hypoexpanded but appear grossly clear. There is no evidence of focal opacification, pleural effusion or pneumothorax. The cardiomediastinal silhouette is within normal limits. No acute osseous abnormalities are seen. IMPRESSION: Lungs mildly hypoexpanded but grossly clear. Electronically Signed   By: Garald Balding M.D.   On: 05/02/2015 00:09   I have personally reviewed and evaluated these images and lab results as part of my medical decision-making.   EKG Interpretation None      ED ECG REPORT   Date: 05/02/2015  Rate: 123  Rhythm: sinus tachycardia  QRS Axis: RAD  Intervals: normal  ST/T Wave abnormalities: nonspecific ST/T changes  Conduction Disutrbances:RVH  Narrative Interpretation:   Old EKG Reviewed: none available  I have personally reviewed the EKG tracing and agree with the computerized printout as noted.   MDM   Final diagnoses:  Altered mental status, unspecified altered mental status type  Benzodiazepine overdose, undetermined intent, initial encounter   Disposition pending  Sally Porter, MD, [place] Performed by: Sally [name] L Total critical care time: 32 min Critical care time was exclusive of separately billable procedures and treating other patients. Critical care was necessary to  treat or prevent imminent or life-threatening deterioration. Critical care was time spent personally by me on the following activities: development of treatment plan with patient and/or surrogate as well as nursing, discussions with consultants, evaluation of patient's response to treatment, examination of patient, obtaining history from patient or surrogate, ordering and performing treatments and interventions, ordering and review of laboratory studies, ordering and review of radiographic studies, pulse oximetry and re-evaluation of patient's condition.    I personally performed the services described in  this documentation, which was scribed in my presence. The recorded information has been reviewed and considered.  Sally Porter, MD, Barbette Or, MD 05/02/15 [id number]

## 2015-05-01 NOTE — ED Notes (Addendum)
Pt is slurring her words and is unable to stay awake at this time. Husband is unsure what pt took at this time.

## 2015-05-02 ENCOUNTER — Encounter (HOSPITAL_COMMUNITY): Payer: Self-pay | Admitting: Emergency Medicine

## 2015-05-02 ENCOUNTER — Emergency Department (HOSPITAL_COMMUNITY): Payer: BLUE CROSS/BLUE SHIELD

## 2015-05-02 DIAGNOSIS — G9340 Encephalopathy, unspecified: Principal | ICD-10-CM | POA: Diagnosis present

## 2015-05-02 DIAGNOSIS — T50901A Poisoning by unspecified drugs, medicaments and biological substances, accidental (unintentional), initial encounter: Secondary | ICD-10-CM | POA: Diagnosis present

## 2015-05-02 DIAGNOSIS — T50904A Poisoning by unspecified drugs, medicaments and biological substances, undetermined, initial encounter: Secondary | ICD-10-CM | POA: Diagnosis not present

## 2015-05-02 LAB — URINALYSIS, ROUTINE W REFLEX MICROSCOPIC
Bilirubin Urine: NEGATIVE
Glucose, UA: NEGATIVE mg/dL
Hgb urine dipstick: NEGATIVE
Ketones, ur: NEGATIVE mg/dL
LEUKOCYTES UA: NEGATIVE
NITRITE: NEGATIVE
PH: 6.5 (ref 5.0–8.0)
Protein, ur: NEGATIVE mg/dL
Urobilinogen, UA: 0.2 mg/dL (ref 0.0–1.0)

## 2015-05-02 LAB — CBC WITH DIFFERENTIAL/PLATELET
Basophils Absolute: 0 10*3/uL (ref 0.0–0.1)
Basophils Relative: 0 %
EOS ABS: 0.3 10*3/uL (ref 0.0–0.7)
Eosinophils Relative: 3 %
HEMATOCRIT: 37.8 % (ref 36.0–46.0)
HEMOGLOBIN: 13.1 g/dL (ref 12.0–15.0)
LYMPHS PCT: 43 %
Lymphs Abs: 3.8 10*3/uL (ref 0.7–4.0)
MCH: 31.2 pg (ref 26.0–34.0)
MCHC: 34.7 g/dL (ref 30.0–36.0)
MCV: 90 fL (ref 78.0–100.0)
MONO ABS: 0.8 10*3/uL (ref 0.1–1.0)
Monocytes Relative: 9 %
NEUTROS ABS: 4.1 10*3/uL (ref 1.7–7.7)
Neutrophils Relative %: 45 %
Platelets: 254 10*3/uL (ref 150–400)
RBC: 4.2 MIL/uL (ref 3.87–5.11)
RDW: 12.2 % (ref 11.5–15.5)
WBC: 8.9 10*3/uL (ref 4.0–10.5)

## 2015-05-02 LAB — COMPREHENSIVE METABOLIC PANEL
ALT: 14 U/L (ref 14–54)
ANION GAP: 8 (ref 5–15)
AST: 21 U/L (ref 15–41)
Albumin: 4.1 g/dL (ref 3.5–5.0)
Alkaline Phosphatase: 60 U/L (ref 38–126)
BUN: 13 mg/dL (ref 6–20)
CO2: 25 mmol/L (ref 22–32)
Calcium: 8.9 mg/dL (ref 8.9–10.3)
Chloride: 106 mmol/L (ref 101–111)
Creatinine, Ser: 0.73 mg/dL (ref 0.44–1.00)
GFR calc Af Amer: >60 mL/min (ref >60)
GFR calc non Af Amer: >60 mL/min (ref >60)
GLUCOSE: 89 mg/dL (ref 65–99)
POTASSIUM: 3.5 mmol/L (ref 3.5–5.1)
SODIUM: 139 mmol/L (ref 135–145)
Total Bilirubin: 0.5 mg/dL (ref 0.3–1.2)
Total Protein: 6.4 g/dL — ABNORMAL LOW (ref 6.5–8.1)

## 2015-05-02 LAB — RAPID URINE DRUG SCREEN, HOSP PERFORMED
AMPHETAMINES: NOT DETECTED
BARBITURATES: NOT DETECTED
BENZODIAZEPINES: POSITIVE — AB
COCAINE: NOT DETECTED
Opiates: NOT DETECTED
TETRAHYDROCANNABINOL: NOT DETECTED

## 2015-05-02 LAB — SALICYLATE LEVEL: Salicylate Lvl: <4 mg/dL (ref 2.8–30.0)

## 2015-05-02 LAB — ACETAMINOPHEN LEVEL

## 2015-05-02 LAB — MRSA PCR SCREENING: MRSA by PCR: NEGATIVE

## 2015-05-02 LAB — ETHANOL: Alcohol, Ethyl (B): <5 mg/dL (ref <5)

## 2015-05-02 LAB — PREGNANCY, URINE: Preg Test, Ur: NEGATIVE

## 2015-05-02 MED ORDER — ACETAMINOPHEN 325 MG PO TABS
650.0000 mg | ORAL_TABLET | Freq: Four times a day (QID) | ORAL | Status: DC | PRN
  Administered 2015-05-02: 650 mg via ORAL
  Filled 2015-05-02: qty 2

## 2015-05-02 MED ORDER — SODIUM CHLORIDE 0.9 % IJ SOLN
3.0000 mL | Freq: Two times a day (BID) | INTRAMUSCULAR | Status: DC
  Administered 2015-05-02: 3 mL via INTRAVENOUS

## 2015-05-02 MED ORDER — SODIUM CHLORIDE 0.9 % IV SOLN
INTRAVENOUS | Status: DC
  Administered 2015-05-02: 15:00:00 via INTRAVENOUS

## 2015-05-02 MED ORDER — SODIUM CHLORIDE 0.9 % IV SOLN
INTRAVENOUS | Status: DC
  Administered 2015-05-02 (×2): via INTRAVENOUS

## 2015-05-02 NOTE — ED Notes (Signed)
Per Jenny Reichmann, Patient cannot be admitted to ICU without suicide sitter. None available. Patient is being held in ED until available. Patient not on Suicide precautions in ED since arrival.

## 2015-05-02 NOTE — ED Notes (Signed)
Report given to [facility] ICU. Awaiting to transport to ICU due to "let us talk to admitting MD to see if we can have a sitter".

## 2015-05-02 NOTE — ED Provider Notes (Addendum)
10:20 AM Pt has been in ED for almost 11 hours. Remains drowsy and combative when stimulated and trying to get her up. Able to tell me in hospital and that 2016 but speech very slurred and most other answers to questions unintelligible. Mydriasis. Wandering eye movements. Is exhibiting signs of antichilinergic toxidrome. Probably was flexeril overdose. Poison control recommending admission.     Virgel Manifold, MD 05/02/15 1024

## 2015-05-02 NOTE — H&P (Signed)
History and Physical  Sally [name] LFY:[id number] DOB: 03/02/1981 DOA: 05/01/2015  Referring physician: Virgel Manifold, MD PCP: Odette Fraction, MD   Chief Complaint: Drug overdose  HPI:  16 yow PMH migraines presented with confusion after taking unknown amount of Flexeril. Was combative in ED, lethargic and unable to provide history. Poison control recommended observation. Admitted for acute encephalopathy presumed secondary to Flexeril ingestion, unknown amount and intent.  Patient confused. Reports she does not remember what happened but denies any depression or intent to harm herself. She denies any CP, SOB, nausea, vomiting, new muscle aches, or HA.    Fiance at bedside reports no PMH mental illness, no previous history of gestures or self harm, no recent problems. Has been working. Was normal self 10/25. In evening took muscle relaxant around 10pm but does not know the exact amount as the entire bottle was empty. She was prescribed Flexeril 10/13 #60 as well as Valium #30. Patient suddenly became unresponsive in the living room 10/25 after having gone to the bathroom.   In the emergency department VSS, afebrile, not hypoxic Pertinent labs: BMP, LFTs, and CBC unremarkable. Salicylate and Acetaminophen level negative. Alcohol negative, UDS positive for Benzodiazepines. EKG independently reviewed. ST. No acute changes. Imaging: Head CT unremarkable. CXR shows no acute disease.   Review of Systems:  Positive for rash   Negative for fever, visual changes, sore throat, new muscle aches, chest pain, SOB, dysuria, bleeding, n/v/abdominal pain.  Past Medical History  Diagnosis Date  . Migraine     Past Surgical History  Procedure Laterality Date  . Cesarean section    . Tubal ligation      Social History:  reports that she has been smoking Cigarettes.  She has been smoking about 0.50 packs per day. She has never used smokeless tobacco. She reports that she drinks alcohol. She  reports that she does not use illicit drugs. lives with their family Self-care  Allergies  Allergen Reactions  . Morphine And Related Itching  . Ibuprofen Itching and Rash    Family History  Problem Relation Age of Onset  . Stroke Father   . Heart failure Father   . Diabetes Father      Prior to Admission medications   Medication Sig Start Date End Date Taking? Authorizing Provider  cyclobenzaprine (FLEXERIL) 10 MG tablet Take 1 tablet (10 mg total) by mouth 3 (three) times daily as needed for muscle spasms. 04/19/15  Yes Mary B Dixon, PA-C  diazepam (VALIUM) 2 MG tablet Take 1 tablet (2 mg total) by mouth every 6 (six) hours as needed for anxiety. 04/19/15  Yes Orlena Sheldon, PA-C  traMADol (ULTRAM) 50 MG tablet Take 1 tablet (50 mg total) by mouth every 6 (six) hours as needed for moderate pain. 04/19/15  Yes Orlena Sheldon, PA-C  acetaminophen (TYLENOL) 500 MG tablet Take 1,000 mg by mouth every 6 (six) hours as needed for mild pain.    Historical Provider, MD  azithromycin (ZITHROMAX Z-PAK) 250 MG tablet 2 po day one, then 1 daily x 4 days 03/18/15   Milton Ferguson, MD  HYDROcodone-acetaminophen (NORCO/VICODIN) 5-325 MG per tablet Take 1 tablet by mouth every 6 (six) hours as needed. Patient taking differently: Take 1 tablet by mouth every 6 (six) hours as needed for moderate pain.  03/08/15   Orlena Sheldon, PA-C  Multiple Vitamin (MULTIVITAMIN WITH MINERALS) TABS tablet Take 1 tablet by mouth daily.    Historical Provider, MD  promethazine (PHENERGAN) 25  MG tablet Take 1 tablet (25 mg total) by mouth every 6 (six) hours as needed for nausea or vomiting. 03/08/15   Lonie Peak Dixon, PA-C  propranolol ER (INDERAL LA) 60 MG 24 hr capsule Take 1 capsule (60 mg total) by mouth daily. For migraines 03/31/14   Alycia Rossetti, MD  SUMAtriptan (IMITREX) 100 MG tablet Take 1 tablet, May repeat in 2 hours if headache persists or recurs. Patient taking differently: Take 100 mg by mouth every 2 (two) hours  as needed for migraine or headache. Take 1 tablet, May repeat in 2 hours if headache persists or recurs. 07/20/14   Orlena Sheldon, PA-C   Physical Exam: Filed Vitals:   05/02/15 1000 05/02/15 1113 05/02/15 1130 05/02/15 1344  BP: 129/82 102/61 114/85 107/68  Pulse: 94 86 88 95  Temp:      TempSrc:      Resp: 15 20 22 21   Height:      Weight:      SpO2: 99% 99% 99% 99%    VSS, afebrile, not hypoxic General: Appears comfortable, calm. Eyes: appear normal, normal pupils ENT: appears normal, lips and tongue Cardiovascular: Regular rate and rhythm, no murmur, rub or gallop. No lower extremity edema. Respiratory: Clear to auscultation bilaterally, no wheezes, rales or rhonchi. Normal respiratory effort. Abdomen: soft, ntnd Skin: no rash or induration noted Musculoskeletal: grossly normal tone bilateral upper and lower extremities, moves extremities to comman Psychiatric: confused, inattentive, speech alternates between clear and slurred.  Neurologic: grossly non-focal.  Wt Readings from Last 3 Encounters:  05/01/15 72.576 kg (160 lb)  04/19/15 76.204 kg (168 lb)  03/18/15 75.297 kg (166 lb)    Labs on Admission:  Basic Metabolic Panel:  Recent Labs Lab 05/02/15 0005  NA 139  K 3.5  CL 106  CO2 25  GLUCOSE 89  BUN 13  CREATININE 0.73  CALCIUM 8.9    Liver Function Tests:  Recent Labs Lab 05/02/15 0005  AST 21  ALT 14  ALKPHOS 60  BILITOT 0.5  PROT 6.4*  ALBUMIN 4.1    CBC:  Recent Labs Lab 05/02/15 0005  WBC 8.9  NEUTROABS 4.1  HGB 13.1  HCT 37.8  MCV 90.0  PLT 254       Radiological Exams on Admission: Ct Head Wo Contrast  05/02/2015  CLINICAL DATA:  Acute onset of altered level of consciousness after drug overdose. Initial encounter. EXAM: CT HEAD WITHOUT CONTRAST TECHNIQUE: Contiguous axial images were obtained from the base of the skull through the vertex without intravenous contrast. COMPARISON:  CT of the head performed 05/12/2014 FINDINGS:  There is no evidence of acute infarction, mass lesion, or intra- or extra-axial hemorrhage on CT. The posterior fossa, including the cerebellum, brainstem and fourth ventricle, is within normal limits. The third and lateral ventricles, and basal ganglia are unremarkable in appearance. The cerebral hemispheres are symmetric in appearance, with normal gray-white differentiation. No mass effect or midline shift is seen. There is no evidence of fracture; visualized osseous structures are unremarkable in appearance. The visualized portions of the orbits are within normal limits. The paranasal sinuses and mastoid air cells are well-aerated. No significant soft tissue abnormalities are seen. IMPRESSION: Unremarkable noncontrast CT of the head. Electronically Signed   By: Garald Balding M.D.   On: 05/02/2015 00:26   Dg Chest Port 1 View  05/02/2015  CLINICAL DATA:  Acute onset of altered mental status. Status post overdose. Initial encounter. EXAM: PORTABLE CHEST 1 VIEW COMPARISON:  Chest radiograph performed 03/18/2015 FINDINGS: The lungs are mildly hypoexpanded but appear grossly clear. There is no evidence of focal opacification, pleural effusion or pneumothorax. The cardiomediastinal silhouette is within normal limits. No acute osseous abnormalities are seen. IMPRESSION: Lungs mildly hypoexpanded but grossly clear. Electronically Signed   By: Garald Balding M.D.   On: 05/02/2015 00:09      Principal Problem:   Acute encephalopathy Active Problems:   Overdose   Assessment/Plan 1. Acute encephalopathy presumed ADR to Flexeril which can cause psychosis. Reportedly bottle empty at home, should still have roughly 30. No focal deficits, no fever, no evidence to suggest neurologic event or infection. Tox screen negative alcohol, Tylenol, salicylate. UDS positive for BZD. 2. Presumed Flexeril ADR, suspected overdose, intent unknown. Also consider overuse. Other meds include Valium, Vicodin.    Plan obs to  SDU, supportive care. When medically clear, plan psychiatry evaluation. For now safety sitter until can determine intent.  Code Status: Full  DVT prophylaxis:SCDs Family Communication: Discussed with patient who understands and has no concerns at this time. Disposition Plan/Anticipated LOS: Obs to SDU.   Time spent: 50 minutes  Murray Hodgkins, MD  [facility] Pager [phone] 05/02/2015, 2:03 PM   By signing my name below, I, Rosalie Doctor attest that this documentation has been prepared under the direction and in the presence of Murray Hodgkins, MD Electronically signed: Rosalie Doctor, Scribe.  05/02/2015 2:06pm

## 2015-05-02 NOTE — ED Notes (Signed)
Patient remains disoriented. Rapid, pressured, incoherent speech. Appears to have visual hallucinations (pointing across the room to an empty corner, speaking with man). Husband at bedside.

## 2015-05-02 NOTE — ED Notes (Signed)
Poison Control contacted, not contacted when patient arrived. Informed of patient, labs, mental status, vital signs. Per poison control, anticholinergic effects could take 24-48 hrs to resolve from flexeril overdose. Recommend admit until resolution of symptoms. MD notified of recommendations. Patient is currently resting with eyes closed.

## 2015-05-02 NOTE — ED Notes (Signed)
Patient resting at this time.

## 2015-05-02 NOTE — ED Notes (Signed)
Patient is more alert, but still incomprehensible, patient will sit up in bed and yell, but not voice any words about ingestion of drugs, husband thinks she is acting like this due to foley catheter, per Dr. Tomi Bamberger foley catheter will stay in place for know.  Patient continues to take off pulse oximetry and have bouts of striking out hands when trying to communicate with her.

## 2015-05-02 NOTE — ED Notes (Signed)
Patient extremely agitated. Out of bed. Husband attempting to return her to bed without success. Patient, after much encouragement and persuasion, resituated in bed and monitor reapplied. Patient pulling foley continuously. Removed for her safety. MD notified.

## 2015-05-02 NOTE — ED Notes (Signed)
ICU called stating patient could come to [place].

## 2015-05-02 NOTE — ED Notes (Signed)
Patient agitated. Cursing intermittently. Significant other reluctant to speak with staff about what happened or who found patient at home and situation surrounding circumstances that brought her in. Patient alert, refuses to interact with staff. VSS.

## 2015-05-03 DIAGNOSIS — T50901A Poisoning by unspecified drugs, medicaments and biological substances, accidental (unintentional), initial encounter: Secondary | ICD-10-CM

## 2015-05-03 DIAGNOSIS — G9340 Encephalopathy, unspecified: Secondary | ICD-10-CM | POA: Diagnosis not present

## 2015-05-03 LAB — COMPREHENSIVE METABOLIC PANEL
ALBUMIN: 3.5 g/dL (ref 3.5–5.0)
ALK PHOS: 50 U/L (ref 38–126)
ALT: 12 U/L — ABNORMAL LOW (ref 14–54)
ANION GAP: 7 (ref 5–15)
AST: 13 U/L — ABNORMAL LOW (ref 15–41)
BILIRUBIN TOTAL: 0.5 mg/dL (ref 0.3–1.2)
BUN: 10 mg/dL (ref 6–20)
CALCIUM: 8.5 mg/dL — AB (ref 8.9–10.3)
CO2: 20 mmol/L — AB (ref 22–32)
Chloride: 114 mmol/L — ABNORMAL HIGH (ref 101–111)
Creatinine, Ser: 0.66 mg/dL (ref 0.44–1.00)
GFR calc non Af Amer: >60 mL/min (ref >60)
GLUCOSE: 80 mg/dL (ref 65–99)
Potassium: 3.5 mmol/L (ref 3.5–5.1)
SODIUM: 141 mmol/L (ref 135–145)
TOTAL PROTEIN: 5.5 g/dL — AB (ref 6.5–8.1)

## 2015-05-03 MED ORDER — ACETAMINOPHEN 500 MG PO TABS: 500.0000 mg | ORAL_TABLET | Freq: Four times a day (QID) | ORAL | Status: DC | PRN

## 2015-05-03 NOTE — Progress Notes (Signed)
Patient had psych consult this AM. Dr. Sarajane Jews discharged patient and all IV access was removed intact. Discharge papers and belonging were given to patient and she was rolled to car by wheelchair.

## 2015-05-03 NOTE — Consult Note (Signed)
TTS Consult Note   Per ICU provider at [facility], [employer], a 34 yo female with PMH migraines presented with confusion after taking unknown amount of Flexeril. Was combative in ED, lethargic and unable to provide history. Poison control recommended observation. Admitted for acute encephalopathy presumed secondary to Flexeril ingestion, unknown amount and intent.  Patient was confused. Reports she did not remember what happened but denies any depression or intent to harm herself. She denies any CP, SOB, nausea, vomiting, new muscle aches, or HA.   She was seen today via telepsych, her husband at bedside, they have 3 children.  She states that she last saw neurologist in [place] 5 mos ago.  She reports that the migraines have worsened in the last few months.  Last evening, she had a migraine attack and she reached for a bottle, it was dark, and she thought it was Ibuprofen but it was Flexeril instead.     She is prescribed Valium and Inderal for migraines.  Patient does not meet inpatient criteria for psychiatric admission.  She adamantly denies SI/HI and AVH.  She would like to go home with her husband and to her children.    I have been consulted about this patient and agree with the assessment and plan Geralyn Flash A. [place].D.

## 2015-05-03 NOTE — Discharge Summary (Signed)
Physician Discharge Summary  Sally [name] [id number] DOB: 26-Mar-1981 DOA: 05/01/2015  PCP: Odette Fraction, MD  Admit date: 05/01/2015 Discharge date: 05/03/2015  Recommendations for Outpatient Follow-up:  1. Discontinue use of Flexeril.    Follow-up Information    Follow up with Odette Fraction, MD.   Specialty:  Family Medicine   Why:  As needed   Contact information:   [address] [place]  [address] [phone]       Discharge Diagnoses:  1. Acute encephalopathy, presumed ADR to Flexeril   2. Unintentional overdose of Flexeril   Discharge Condition: Improved Disposition: Home  Diet recommendation: Regular  Filed Weights   05/01/15 2339 05/02/15 1415 05/03/15 0500  Weight: 72.576 kg (160 lb) 73.1 kg (161 lb 2.5 oz) 74.5 kg (164 lb 3.9 oz)    History of present illness:  11 yow PMH migraines presented with confusion after taking unknown amount of Flexeril. Was combative in ED, lethargic and unable to provide history. Poison control recommended observation. Admitted for acute encephalopathy presumed secondary to Flexeril ingestion, unknown amount and intent.  Hospital Course:  Acute encephalopathy, presumed ADR to Flexeril resolved spontaneously. Patient denied any intent to harm herself and states she thought she was taking 5 Tylenol because the lights were off. Psychiatry was consulted and recommended no further evaluation. Patient advised to discontinue using Flexeril.   Individual issues as below:  1. Acute encephalopathy, resolved. Presumed ADR to Flexeril which can cause psychosis. No focal deficits, fever, or evidence to suggest neurologic event or infection. Tox screen negative for alcohol, Tylenol, salicylate. UDS positive for BZD. No h/o mental illness. 2. Presumed Flexeril ADR, reported accidental overdose. Counseled on the cessation of Flexeril use. Other meds include Valium,  Vicodin.  Consultants:  psychiatry  Procedures:  none  Antibiotics:  none  Discharge Instructions   Current Discharge Medication List    CONTINUE these medications which have CHANGED   Details  acetaminophen (TYLENOL) 500 MG tablet Take 1 tablet (500 mg total) by mouth every 6 (six) hours as needed for mild pain.      CONTINUE these medications which have NOT CHANGED   Details  diazepam (VALIUM) 2 MG tablet Take 1 tablet (2 mg total) by mouth every 6 (six) hours as needed for anxiety. Qty: 30 tablet, Refills: 0   Associated Diagnoses: Tension headache; Neck pain, musculoskeletal    HYDROcodone-acetaminophen (NORCO/VICODIN) 5-325 MG per tablet Take 1 tablet by mouth every 6 (six) hours as needed. Qty: 30 tablet, Refills: 0   Associated Diagnoses: Intractable migraine with status migrainosus, unspecified migraine type    Multiple Vitamin (MULTIVITAMIN WITH MINERALS) TABS tablet Take 1 tablet by mouth daily.    propranolol ER (INDERAL LA) 60 MG 24 hr capsule Take 1 capsule (60 mg total) by mouth daily. For migraines Qty: 30 capsule, Refills: 3      STOP taking these medications     cyclobenzaprine (FLEXERIL) 10 MG tablet      promethazine (PHENERGAN) 25 MG tablet      traMADol (ULTRAM) 50 MG tablet        Allergies  Allergen Reactions  . Morphine And Related Itching  . Ibuprofen Itching and Rash    The results of significant diagnostics from this hospitalization (including imaging, microbiology, ancillary and laboratory) are listed below for reference.    Significant Diagnostic Studies: Ct Head Wo Contrast  05/02/2015  CLINICAL DATA:  Acute onset of altered level of consciousness after drug overdose. Initial encounter.  EXAM: CT HEAD WITHOUT CONTRAST TECHNIQUE: Contiguous axial images were obtained from the base of the skull through the vertex without intravenous contrast. COMPARISON:  CT of the head performed 05/12/2014 FINDINGS: There is no evidence of acute  infarction, mass lesion, or intra- or extra-axial hemorrhage on CT. The posterior fossa, including the cerebellum, brainstem and fourth ventricle, is within normal limits. The third and lateral ventricles, and basal ganglia are unremarkable in appearance. The cerebral hemispheres are symmetric in appearance, with normal gray-white differentiation. No mass effect or midline shift is seen. There is no evidence of fracture; visualized osseous structures are unremarkable in appearance. The visualized portions of the orbits are within normal limits. The paranasal sinuses and mastoid air cells are well-aerated. No significant soft tissue abnormalities are seen. IMPRESSION: Unremarkable noncontrast CT of the head. Electronically Signed   By: Garald Balding M.D.   On: 05/02/2015 00:26   Dg Chest Port 1 View  05/02/2015  CLINICAL DATA:  Acute onset of altered mental status. Status post overdose. Initial encounter. EXAM: PORTABLE CHEST 1 VIEW COMPARISON:  Chest radiograph performed 03/18/2015 FINDINGS: The lungs are mildly hypoexpanded but appear grossly clear. There is no evidence of focal opacification, pleural effusion or pneumothorax. The cardiomediastinal silhouette is within normal limits. No acute osseous abnormalities are seen. IMPRESSION: Lungs mildly hypoexpanded but grossly clear. Electronically Signed   By: Garald Balding M.D.   On: 05/02/2015 00:09    Microbiology: Recent Results (from the past 240 hour(s))  MRSA PCR Screening     Status: None   Collection Time: 05/02/15  2:00 PM  Result Value Ref Range Status   MRSA by PCR NEGATIVE NEGATIVE Final    Comment:        The GeneXpert MRSA Assay (FDA approved for NASAL specimens only), is one component of a comprehensive MRSA colonization surveillance program. It is not intended to diagnose MRSA infection nor to guide or monitor treatment for MRSA infections.      Labs: Basic Metabolic Panel:  Recent Labs Lab 05/02/15 0005 05/03/15 0504   NA 139 141  K 3.5 3.5  CL 106 114*  CO2 25 20*  GLUCOSE 89 80  BUN 13 10  CREATININE 0.73 0.66  CALCIUM 8.9 8.5*   Liver Function Tests:  Recent Labs Lab 05/02/15 0005 05/03/15 0504  AST 21 13*  ALT 14 12*  ALKPHOS 60 50  BILITOT 0.5 0.5  PROT 6.4* 5.5*  ALBUMIN 4.1 3.5    CBC:  Recent Labs Lab 05/02/15 0005  WBC 8.9  NEUTROABS 4.1  HGB 13.1  HCT 37.8  MCV 90.0  PLT 254     Principal Problem:   Acute encephalopathy Active Problems:   Overdose   Time coordinating discharge: 35 minutes  Signed:  Murray Hodgkins, MD [facility] 05/03/2015, 9:46 AM   By signing my name below, I, Rosalie Doctor attest that this documentation has been prepared under the direction and in the presence of Murray Hodgkins, MD Electronically signed: Rosalie Doctor, Scribe. 05/03/2015  I personally performed the services described in this documentation. All medical record entries made by the scribe were at my direction. I have reviewed the chart and agree that the record reflects my personal performance and is accurate and complete. Murray Hodgkins, MD

## 2015-05-03 NOTE — Care Management Note (Signed)
Case Management Note  Patient Details  Name: Sally [name] MRN: [id number] Date of Birth: 16-Jan-1981  Subjective/Objective:                  Pt is from home, ind with ADL's. Pt admitted for encephalopathy due to overmedication.   Action/Plan: Anticipate DC home today with self care. Pt will undergo TTS assessment prior to DC. No CM needs.   Expected Discharge Date:  05/05/15               Expected Discharge Plan:  Home/Self Care  In-House Referral:  NA  Discharge planning Services  CM Consult  Post Acute Care Choice:  NA Choice offered to:  NA  DME Arranged:    DME Agency:     HH Arranged:    HH Agency:     Status of Service:  Completed, signed off  Medicare Important Message Given:    Date Medicare IM Given:    Medicare IM give by:    Date Additional Medicare IM Given:    Additional Medicare Important Message give by:     If discussed at [place] of Stay Meetings, dates discussed:    Additional Comments:  Sherald Barge, RN 05/03/2015, 1:14 PM

## 2015-05-03 NOTE — Progress Notes (Signed)
PROGRESS NOTE  Sally [name] [id number] DOB: Dec 18, 1980 DOA: 05/01/2015 PCP: Odette Fraction, MD  Summary: 53 yow PMH migraines presented with confusion after taking unknown amount of Flexeril. Was combative in ED, lethargic and unable to provide history. Poison control recommended observation. Admitted for acute encephalopathy presumed secondary to Flexeril ingestion, unknown amount and intent.  Assessment/Plan: 1. Acute encephalopathy, resolved. Presumed ADR to Flexeril which can cause psychosis. No focal deficits,  fever, or evidence to suggest neurologic event or infection. Tox screen negative for alcohol, Tylenol, salicylate. UDS positive for BZD. No h/o mental illness. 2. Presumed Flexeril ADR, reported accidental overdose. Counseled on the cessation of Flexeril use. Other meds include Valium, Vicodin.   Overall improved. Appears to be back to baseline.  Denies SI/SA. Appears to be accidental over ingestion. Will consult psychiatry for opinion. Patient is medically clear. If clear per psychiatry will plan discharge home later today.  Code Status: Full DVT prophylaxis:SCDs Family Communication: Discussed with patient who understands and has no concerns at this time. Disposition Plan:    Murray Hodgkins, MD  [facility]  Pager [phone] If 7PM-7AM, please contact night-coverage at [email or web address], password [facility] 05/03/2015, 6:29 AM    Consultants:    Procedures:    Antibiotics:    HPI/Subjective: Feels good and wants to go home. Does not remember the events of yesterday. Remembers having a migraine so she laid down in the dark and took what she thought were 5 Tylenol however she thinks she grabbed the wrong bottle. Denies any attempt to harm herself or history of depression.  Reports she was given 30 Flexeril and 30 Valium as outpatient.    Objective: Filed Vitals:   05/03/15 0200 05/03/15 0300 05/03/15 0400 05/03/15 0500  BP: 110/73 104/73 116/86    Pulse: 78 91 83   Temp:   98.5 F (36.9 C)   TempSrc:   Oral   Resp: 22 24 20    Height:      Weight:    74.5 kg (164 lb 3.9 oz)  SpO2: 95% 97% 97%     Intake/Output Summary (Last 24 hours) at 05/03/15 0629 Last data filed at 05/02/15 2206  Gross per 24 hour  Intake 1027.5 ml  Output   1050 ml  Net  -22.5 ml     Filed Weights   05/01/15 2339 05/02/15 1415 05/03/15 0500  Weight: 72.576 kg (160 lb) 73.1 kg (161 lb 2.5 oz) 74.5 kg (164 lb 3.9 oz)    Exam:     VSS, afebrile, not hypoxic General:  Appears calm and comfortable Cardiovascular: RRR, no m/r/g. No LE edema. Telemetry: SR, no arrhythmias  Respiratory: CTA bilaterally, no w/r/r. Normal respiratory effort. Abdomen: soft, ntnd Musculoskeletal: grossly normal tone BUE/BLE Psychiatric: grossly normal mood and affect, speech fluent and appropriate. Oriented to person, place, month, year, and reason for hospitalization. Neurologic: grossly non-focal.  New data reviewed:  BMP unremarkable, LFTs unremarkable  Pertinent data since admission:  UDS positive for benzodiazepines.  Pending data:    Scheduled Meds: . sodium chloride  3 mL Intravenous Q12H   Continuous Infusions: . sodium chloride 150 mL/hr at 05/02/15 2206    Principal Problem:   Acute encephalopathy Active Problems:   Overdose    By signing my name below, I, Rosalie Doctor attest that this documentation has been prepared under the direction and in the presence of Murray Hodgkins, MD Electronically signed: Rosalie Doctor, Scribe. 05/03/2015 9:43am  I personally performed the services described in this documentation. All  medical record entries made by the scribe were at my direction. I have reviewed the chart and agree that the record reflects my personal performance and is accurate and complete. Murray Hodgkins, MD

## 2015-05-09 ENCOUNTER — Telehealth: Payer: Self-pay | Admitting: Family Medicine

## 2015-05-09 NOTE — Telephone Encounter (Signed)
Ok to refill??  Last office visit/  refill 04/19/2015.

## 2015-05-09 NOTE — Telephone Encounter (Signed)
PHARMACY: SELF PICK UP   MEDICATION: VALIUM   QTY:    SIG: *PT STATES THAT SHE TAKES 2 A DAY  PHYSICIAN: PICKARD   PT. PHONE #: [phone]  -pt has not had any migraines since starting this medication.

## 2015-05-10 NOTE — Telephone Encounter (Signed)
Call placed to patient. LMTRC.  

## 2015-05-10 NOTE — Telephone Encounter (Signed)
Recent unintentional overdose.  NTBS for hospital follow up.

## 2015-05-10 NOTE — Telephone Encounter (Addendum)
Call placed to patient and patient made aware.   Appointment scheduled.  

## 2015-05-15 ENCOUNTER — Inpatient Hospital Stay: Payer: Self-pay | Admitting: Family Medicine

## 2015-05-18 ENCOUNTER — Encounter: Payer: Self-pay | Admitting: Family Medicine

## 2015-05-18 ENCOUNTER — Ambulatory Visit (INDEPENDENT_AMBULATORY_CARE_PROVIDER_SITE_OTHER): Payer: BLUE CROSS/BLUE SHIELD | Admitting: Family Medicine

## 2015-05-18 VITALS — BP 126/68 | HR 78 | Temp 97.9°F | Resp 14 | Ht 65.0 in | Wt 162.0 lb

## 2015-05-18 DIAGNOSIS — G43001 Migraine without aura, not intractable, with status migrainosus: Secondary | ICD-10-CM | POA: Diagnosis not present

## 2015-05-18 DIAGNOSIS — G44209 Tension-type headache, unspecified, not intractable: Secondary | ICD-10-CM

## 2015-05-18 MED ORDER — DIAZEPAM 2 MG PO TABS: 2.0000 mg | ORAL_TABLET | Freq: Four times a day (QID) | ORAL | Status: DC | PRN

## 2015-05-18 NOTE — Patient Instructions (Signed)
Continue current medications F/U 6 months or as needed

## 2015-05-18 NOTE — Assessment & Plan Note (Addendum)
After discussion and review of psychiatry notes/ER notes she did not intentionally try to overdose herself. She will continue with her Inderal which is being prescribed by her neurologist. She is holding off on doing Botox injections for her migraines. She uses the hydrocodone very sparingly if she has a severe migraine. I'll go ahead and refill her Valium 2 mg as needed for migraines which has been working well.  No further flexeril due to adverse reactions per above

## 2015-05-18 NOTE — Progress Notes (Signed)
Patient ID: Sally [name], female   DOB: Oct 27, 1980, 34 y.o.   MRN: [id number]   Subjective:    Patient ID: Sally [name], female    DOB: [birth year], 34 y.o.   MRN: [id number]  Patient presents for Hospital F/U   Here for hospital follow-up. She was seen and the hospital on October 26 after unintentional overdose of Flexeril. She states that she had a friend over with their children and a unscrewed her Tylenol bottle. And when she went to grab the bottle Tylenol to take for regular headache she actually grab Flexeril and took 4 Flexeril. She was then taken to the emergency room by her boyfriend by that time the medications that affect and she was very combative and encephalopathic. Poison control was called. She was cleared by psychiatry that this was unintentional there is no sign of depression severe anxiety or suicidal ideations. She typically has Flexeril at home because of muscle spasms with her tension headaches. She has history of chronic migraine disorder and she is being followed by neurology. She is currently on Inderal for  Prophylaxis however for breakthrough migraine she is on Valium 2 mg and she has hydrocodone is very severe. Her neurologist wants her to undergo Botox injections as nothing else is healthy but she does not want to proceed with this. She is requesting a refill on her Valium which we have denied until she came in for hospital follow-up.    Review Of Systems:  GEN- denies fatigue, fever, weight loss,weakness, recent illness HEENT- denies eye drainage, change in vision, nasal discharge, CVS- denies chest pain, palpitations RESP- denies SOB, cough, wheeze ABD- denies N/V, change in stools, abd pain Neuro- + headache, dizziness, syncope, seizure activity       Objective:    BP 126/68 mmHg  Pulse 78  Temp(Src) 97.9 F (36.6 C) (Oral)  Resp 14  Ht 5\' 5"  (1.651 m)  Wt 162 lb (73.483 kg)  BMI 26.96 kg/m2 GEN- NAD, alert and oriented x3 HEENT- PERRL, EOMI,  non injected sclera, pink conjunctiva, MMM, oropharynx clear Neck- Supple, no thyromegaly CVS- RRR, no murmur RESP-CTAB Psych- normal affect and mood Neuro-CNII-XII intact no focal deficits Pulses- Radial - 2+        Assessment & Plan:      Problem List Items Addressed This Visit    Migraines     After discussion and review of psychiatry notes/ER notes she did not intentionally try to overdose herself. She will continue with her Inderal which is being prescribed by her neurologist. She is holding off on doing Botox injections for her migraines. She uses the hydrocodone very sparingly if she has a severe migraine. I'll go ahead and refill her Valium 2 mg as needed for migraines which has been working well.  No further flexeril due to adverse reactions per above       Other Visit Diagnoses    Tension headache    -  Primary    Relevant Medications    diazepam (VALIUM) 2 MG tablet       Note: This dictation was prepared with Dragon dictation along with smaller phrase technology. Any transcriptional errors that result from this process are unintentional.

## 2015-05-23 ENCOUNTER — Telehealth: Payer: Self-pay | Admitting: Family Medicine

## 2015-05-23 DIAGNOSIS — G43911 Migraine, unspecified, intractable, with status migrainosus: Secondary | ICD-10-CM

## 2015-05-23 NOTE — Telephone Encounter (Signed)
Pt is calling for a RX of Hydrocodone for her migraines. Please call [phone]

## 2015-05-24 MED ORDER — HYDROCODONE-ACETAMINOPHEN 5-325 MG PO TABS: 1.0000 | ORAL_TABLET | Freq: Four times a day (QID) | ORAL | Status: DC | PRN

## 2015-05-24 NOTE — Telephone Encounter (Signed)
Left pt message can pick up RX after 2PM today.

## 2015-05-24 NOTE — Telephone Encounter (Signed)
Ok to refill??  Last office visit 05/18/2015.  Last refill 03/08/2015.

## 2015-05-24 NOTE — Telephone Encounter (Signed)
Approved. # 30 + 0. 

## 2015-07-10 ENCOUNTER — Telehealth: Payer: Self-pay | Admitting: Family Medicine

## 2015-07-10 DIAGNOSIS — G43911 Migraine, unspecified, intractable, with status migrainosus: Secondary | ICD-10-CM

## 2015-07-10 NOTE — Telephone Encounter (Signed)
Patient is calling to her her hydrocodone  [phone]

## 2015-07-10 NOTE — Telephone Encounter (Signed)
Ok to refill??  Last OV 05/24/2015.  Last refill 05/18/2015.

## 2015-07-11 MED ORDER — HYDROCODONE-ACETAMINOPHEN 5-325 MG PO TABS: 1.0000 | ORAL_TABLET | Freq: Four times a day (QID) | ORAL | Status: DC | PRN

## 2015-07-11 NOTE — Telephone Encounter (Signed)
Reviewed her last office visit note here which was with Dr. Buelah Manis 05/18/15. Also reviewed medication refills. Last refill on hydrocodone was November 17 for #30. May now refill Hydrocodone for #30+0.

## 2015-07-11 NOTE — Telephone Encounter (Signed)
Forward to PCP - see if she wants to continue hydrocodone,

## 2015-07-11 NOTE — Telephone Encounter (Signed)
Rx ready, left message for pt.

## 2015-07-13 ENCOUNTER — Ambulatory Visit (INDEPENDENT_AMBULATORY_CARE_PROVIDER_SITE_OTHER): Payer: BLUE CROSS/BLUE SHIELD | Admitting: Family Medicine

## 2015-07-13 ENCOUNTER — Encounter: Payer: Self-pay | Admitting: Family Medicine

## 2015-07-13 ENCOUNTER — Telehealth: Payer: Self-pay | Admitting: Family Medicine

## 2015-07-13 VITALS — BP 128/70 | HR 76 | Temp 98.3°F | Resp 14 | Ht 65.0 in | Wt 161.0 lb

## 2015-07-13 DIAGNOSIS — N3001 Acute cystitis with hematuria: Secondary | ICD-10-CM | POA: Diagnosis not present

## 2015-07-13 DIAGNOSIS — G43001 Migraine without aura, not intractable, with status migrainosus: Secondary | ICD-10-CM

## 2015-07-13 DIAGNOSIS — N898 Other specified noninflammatory disorders of vagina: Secondary | ICD-10-CM | POA: Diagnosis not present

## 2015-07-13 DIAGNOSIS — G44209 Tension-type headache, unspecified, not intractable: Secondary | ICD-10-CM | POA: Diagnosis not present

## 2015-07-13 LAB — WET PREP FOR TRICH, YEAST, CLUE
Clue Cells Wet Prep HPF POC: NONE SEEN
Trich, Wet Prep: NONE SEEN
YEAST WET PREP: NONE SEEN

## 2015-07-13 LAB — URINALYSIS, ROUTINE W REFLEX MICROSCOPIC
BILIRUBIN URINE: NEGATIVE
Glucose, UA: NEGATIVE
KETONES UR: NEGATIVE
Leukocytes, UA: NEGATIVE
NITRITE: NEGATIVE
PH: 5.5 (ref 5.0–8.0)
Protein, ur: NEGATIVE

## 2015-07-13 LAB — URINALYSIS, MICROSCOPIC ONLY
CASTS: NONE SEEN [LPF]
CRYSTALS: NONE SEEN [HPF]
Yeast: NONE SEEN [HPF]

## 2015-07-13 MED ORDER — CIPROFLOXACIN HCL 500 MG PO TABS: 500.0000 mg | ORAL_TABLET | Freq: Two times a day (BID) | ORAL | Status: DC

## 2015-07-13 MED ORDER — DIAZEPAM 2 MG PO TABS: 2.0000 mg | ORAL_TABLET | Freq: Four times a day (QID) | ORAL | Status: DC | PRN

## 2015-07-13 MED ORDER — PROPRANOLOL HCL ER 60 MG PO CP24: 60.0000 mg | ORAL_CAPSULE | Freq: Every day | ORAL | Status: AC

## 2015-07-13 NOTE — Telephone Encounter (Signed)
Received fmla forms routed to sabrina on 07/13/2015 at 2:42 pm

## 2015-07-13 NOTE — Assessment & Plan Note (Signed)
Restart migraine meds- advised to f/u with neurology  She will need to fax over FMLA Pain meds refilled by PCP yesterday

## 2015-07-13 NOTE — Patient Instructions (Addendum)
Note for work - From 07/10/14-07/13/15 can return as scheduled after today  Restart migraine medications Keep appt with your neurologist F/U as needed

## 2015-07-13 NOTE — Progress Notes (Signed)
Patient ID: Sally [name], female   DOB: 08-24-1980, 35 y.o.   MRN: [id number]   Subjective:    Patient ID: Sally [name], female    DOB: [birth year], 35 y.o.   MRN: [id number]  Patient presents for Migraine and Lower Back Pain  patient here with her typical migraines, She has appt to see neurologist on 14th per report She is out of all of her migraine meds except her nausea medication.  She went home early from work on Wed and missed yesterday and today. Her FMLA is also run out.    Back pain for past 4 days, states she had some mild dysuria, but also had vaginal discharge, but then went on tangent of feeling like she may have kidney stone again? LMP 3 weeks ago     Review Of Systems:  GEN- denies fatigue, fever, weight loss,weakness, recent illness HEENT- denies eye drainage, change in vision, nasal discharge, CVS- denies chest pain, palpitations RESP- denies SOB, cough, wheeze ABD- denies N/V, change in stools, abd pain GU- +dysuria, hematuria, dribbling, incontinence MSK- + joint pain, muscle aches, injury Neuro-+ headache, dizziness, syncope, seizure activity       Objective:    BP 128/70 mmHg  Pulse 76  Temp(Src) 98.3 F (36.8 C) (Oral)  Resp 14  Ht 5\' 5"  (1.651 m)  Wt 161 lb (73.029 kg)  BMI 26.79 kg/m2 GEN- NAD, alert and oriented x3 HEENT- PERRL, EOMI, non injected sclera, pink conjunctiva, MMM, oropharynx clear CVS- RRR, no murmur RESP-CTAB ABD-NABS,soft,mild TTP suprapubic region, TTP bilat flanks, no rebound, no gaurding MSK- TTP midline spine, good ROM,  GU- normal external genitalia, vaginal mucosa pink and moist, cervix visualized no growth, no blood form os, minimal thin clear discharge, no CMT, no ovarian masses, uterus normal size         Assessment & Plan:      Problem List Items Addressed This Visit    Migraines    Restart migraine meds- advised to f/u with neurology  She will need to fax over FMLA Pain meds refilled by PCP  yesterday      Relevant Medications   propranolol ER (INDERAL LA) 60 MG 24 hr capsule    Other Visit Diagnoses    Acute cystitis with hematuria    -  Primary    Trace microscopic hematuria, Wet prep neg, Give Cipro x 3 days     Relevant Orders    Urinalysis, Routine w reflex microscopic (not at [facility]) (Completed)    Tension headache        Relevant Medications    propranolol ER (INDERAL LA) 60 MG 24 hr capsule    diazepam (VALIUM) 2 MG tablet    Vaginal discharge        Wet prep no infection     Relevant Orders    WET PREP FOR TRICH, YEAST, CLUE (Completed)       Note: This dictation was prepared with Dragon dictation along with smaller Company secretary. Any transcriptional errors that result from this process are unintentional.

## 2015-07-16 NOTE — Telephone Encounter (Signed)
Received FMLA on my desk, contacted pt as to why needs FMLA and stated for her Migraines when she was here on 07/11/15 was put out of work from 07/11/15-07/17/15  Pt job duties: Single Designer, television/film set Pt title: Glass blower/designer Job hours: 12 hr shift 7p-7am  Pt states her employer needs these sent back TODAY. I informed pt will do best to have done but we asked to allow 7 business days. Pt voices if not sent today will she be FIRED.  I routed FMLA to Dr. Buelah Manis

## 2015-07-16 NOTE — Telephone Encounter (Signed)
Call pt, I will complete FMLA for her migraines, her work note was only from 1/4-1/6 and no other days. Her FMLA is for intermittant leave which gives  Her a few days to miss work in case of severe migraines.  Nothing else can be covered for her missed work in the past. Also it is her responsibility to get her paperwork to our office In a timely manner.

## 2015-07-16 NOTE — Telephone Encounter (Signed)
Received FMLA from provider signed and contacted pt to inform her it is ready and needs to pay form fee, pt gave card number and the ppw was faxed to [place] to [facility] RN,BSN [phone]

## 2015-08-02 ENCOUNTER — Encounter: Payer: Self-pay | Admitting: Family Medicine

## 2015-08-15 ENCOUNTER — Telehealth: Payer: Self-pay | Admitting: Family Medicine

## 2015-08-15 DIAGNOSIS — G43911 Migraine, unspecified, intractable, with status migrainosus: Secondary | ICD-10-CM

## 2015-08-15 NOTE — Telephone Encounter (Signed)
?   OK to Refill  

## 2015-08-15 NOTE — Telephone Encounter (Signed)
Patient would like rx for her hydrocodone

## 2015-08-16 MED ORDER — HYDROCODONE-ACETAMINOPHEN 5-325 MG PO TABS: 1.0000 | ORAL_TABLET | Freq: Four times a day (QID) | ORAL | Status: DC | PRN

## 2015-08-16 NOTE — Telephone Encounter (Signed)
ok 

## 2015-08-16 NOTE — Telephone Encounter (Signed)
RX printed, left up front and patient aware to pick up  

## 2015-09-04 ENCOUNTER — Encounter: Payer: Self-pay | Admitting: Family Medicine

## 2015-09-12 ENCOUNTER — Ambulatory Visit (INDEPENDENT_AMBULATORY_CARE_PROVIDER_SITE_OTHER): Payer: BLUE CROSS/BLUE SHIELD | Admitting: Physician Assistant

## 2015-09-12 ENCOUNTER — Encounter: Payer: Self-pay | Admitting: Family Medicine

## 2015-09-12 ENCOUNTER — Encounter: Payer: Self-pay | Admitting: Physician Assistant

## 2015-09-12 VITALS — BP 120/82 | HR 68 | Temp 97.5°F | Resp 18 | Wt 158.0 lb

## 2015-09-12 DIAGNOSIS — J988 Other specified respiratory disorders: Secondary | ICD-10-CM | POA: Diagnosis not present

## 2015-09-12 DIAGNOSIS — Z720 Tobacco use: Secondary | ICD-10-CM | POA: Diagnosis not present

## 2015-09-12 DIAGNOSIS — B9689 Other specified bacterial agents as the cause of diseases classified elsewhere: Principal | ICD-10-CM

## 2015-09-12 DIAGNOSIS — F17200 Nicotine dependence, unspecified, uncomplicated: Secondary | ICD-10-CM

## 2015-09-12 MED ORDER — AZITHROMYCIN 250 MG PO TABS: ORAL_TABLET | ORAL | Status: DC

## 2015-09-12 MED ORDER — VARENICLINE TARTRATE 0.5 MG PO TABS: 0.5000 mg | ORAL_TABLET | Freq: Two times a day (BID) | ORAL | Status: DC

## 2015-09-12 MED ORDER — VARENICLINE TARTRATE 1 MG PO TABS: 1.0000 mg | ORAL_TABLET | Freq: Two times a day (BID) | ORAL | Status: DC

## 2015-09-12 MED ORDER — HYDROCODONE-HOMATROPINE 5-1.5 MG/5ML PO SYRP: 5.0000 mL | ORAL_SOLUTION | Freq: Three times a day (TID) | ORAL | Status: DC | PRN

## 2015-09-12 NOTE — Progress Notes (Signed)
Patient ID: Sally [name] MRN: [id number], DOB: December 15, 1980, 35 y.o. Date of Encounter: 09/12/2015, 9:35 AM    Chief Complaint:  Chief Complaint  Patient presents with  . sick x 2 weeks    started in sinuses now in chest with harsh cough,  green secretions,  wants to discuss nicotine patches     HPI: 35 y.o. year old female presents with above.   See The above information. Patient states that now she has a really bad cough and that it is keeping her up most of the night. Is also requesting medication cough suppressant so that she can get some sleep. Says that right now while cigarettes don't even taste good to her she is wanting to quit smoking. Has had no significant sore throat. No fevers or chills.     Home Meds:   Outpatient Prescriptions Prior to Visit  Medication Sig Dispense Refill  . acetaminophen (TYLENOL) 500 MG tablet Take 1 tablet (500 mg total) by mouth every 6 (six) hours as needed for mild pain.    . diazepam (VALIUM) 2 MG tablet Take 1 tablet (2 mg total) by mouth every 6 (six) hours as needed for anxiety. 30 tablet 2  . HYDROcodone-acetaminophen (NORCO/VICODIN) 5-325 MG tablet Take 1 tablet by mouth every 6 (six) hours as needed for moderate pain. 30 tablet 0  . Multiple Vitamin (MULTIVITAMIN WITH MINERALS) TABS tablet Take 1 tablet by mouth daily.    . propranolol ER (INDERAL LA) 60 MG 24 hr capsule Take 1 capsule (60 mg total) by mouth daily. For migraines 30 capsule 3  . ciprofloxacin (CIPRO) 500 MG tablet Take 1 tablet (500 mg total) by mouth 2 (two) times daily. 6 tablet 0   No facility-administered medications prior to visit.    Allergies:  Allergies  Allergen Reactions  . Morphine And Related Itching  . Ibuprofen Itching and Rash      Review of Systems: See HPI for pertinent ROS. All other ROS negative.    Physical Exam: Blood pressure 120/82, pulse 68, temperature 97.5 F (36.4 C), temperature source Oral, resp. rate 18, weight 158 lb  (71.668 kg)., Body mass index is 26.29 kg/(m^2). General:  WNWD WF. Appears in no acute distress. HEENT: Normocephalic, atraumatic, eyes without discharge, sclera non-icteric, nares are without discharge. Bilateral auditory canals clear. Bilateral TMs dull, golden color, with areas of inflammation.  Oral cavity moist, posterior pharynx without exudate, erythema, peritonsillar abscess. No tenderness with percussion to frontal or maxillary sinuses bilaterally. Neck: Supple. No thyromegaly. No lymphadenopathy. Lungs: Clear bilaterally to auscultation without wheezes, rales, or rhonchi. Breathing is unlabored. Heart: Regular rhythm. No murmurs, rubs, or gallops. Msk:  Strength and tone normal for age. Extremities/Skin: Warm and dry.No rashes. Neuro: Alert and oriented X 3. Moves all extremities spontaneously. Gait is normal. CNII-XII grossly in tact. Psych:  Responds to questions appropriately with a normal affect.     ASSESSMENT AND PLAN:  35 y.o. year old female with  1. Bacterial respiratory infection - azithromycin (ZITHROMAX) 250 MG tablet; Day 1: Take 2 daily. Days 2-5: Take 1 daily.  Dispense: 6 tablet; Refill: 0 - HYDROcodone-homatropine (HYCODAN) 5-1.5 MG/5ML syrup; Take 5 mLs by mouth every 8 (eight) hours as needed for cough.  Dispense: 120 mL; Refill: 0  2. Smoker Gave her savings card for Chantix. She is going to see what the cost would be to use this. If this is too expensive and she will call me for an alternative treatment.  However if this is affordable and she will use the starting pack first and then go to the continuing [place]. She can stay on the Chantix for several months until cravings subside. She develops any adverse effects from the medication she is to stop the medicine and call us immediately. - varenicline (CHANTIX) 0.5 MG tablet; Take 1 tablet (0.5 mg total) by mouth 2 (two) times daily.  Dispense: 60 tablet; Refill: 0 - varenicline (CHANTIX CONTINUING MONTH PAK) 1 MG  tablet; Take 1 tablet (1 mg total) by mouth 2 (two) times daily.  Dispense: 60 tablet; Refill: 3   Signed, [address] [place], [place], [facility] 09/12/2015 9:35 AM

## 2015-10-02 ENCOUNTER — Other Ambulatory Visit: Payer: Self-pay | Admitting: Family Medicine

## 2015-10-02 DIAGNOSIS — G43911 Migraine, unspecified, intractable, with status migrainosus: Secondary | ICD-10-CM

## 2015-10-02 NOTE — Telephone Encounter (Signed)
Patient is calling to get rx for her hydrocodone and also a new muscle relaxor, she says she is allergic to hers  [phone]

## 2015-10-02 NOTE — Telephone Encounter (Signed)
I will send to [facility].  Not my patient

## 2015-10-03 MED ORDER — METAXALONE 800 MG PO TABS: 800.0000 mg | ORAL_TABLET | Freq: Four times a day (QID) | ORAL | Status: DC

## 2015-10-03 MED ORDER — HYDROCODONE-ACETAMINOPHEN 5-325 MG PO TABS: 1.0000 | ORAL_TABLET | Freq: Four times a day (QID) | ORAL | Status: DC | PRN

## 2015-10-03 NOTE — Telephone Encounter (Signed)
Did she see Neuro? (specialist for h/a) Note dated 07/13/15 stated that she had an appointment to see neuro on January 14.   will refill her hydrocodone for #30+0. For muscle relaxer can try Skelaxin 800 mg 1 by mouth 4 times a day when necessary #60+0.  caution her that this may cause drowsiness and if so will have to just take at night.

## 2015-10-03 NOTE — Telephone Encounter (Signed)
Pt aware that rx's will be ready tomorrow - pt did see neuro and f/u appt scheduled with [facility] to discuss.

## 2015-10-11 ENCOUNTER — Ambulatory Visit: Payer: BLUE CROSS/BLUE SHIELD | Admitting: Physician Assistant

## 2015-10-12 ENCOUNTER — Encounter: Payer: Self-pay | Admitting: Family Medicine

## 2015-10-31 ENCOUNTER — Telehealth: Payer: Self-pay | Admitting: Family Medicine

## 2015-10-31 NOTE — Telephone Encounter (Signed)
Pt needs a refill of Hydrocodone 5-325 [phone]

## 2015-11-01 NOTE — Telephone Encounter (Signed)
I am listed as her pcp but she mainly sees you.  I am not sure if you want herr to have narcotics.

## 2015-11-01 NOTE — Telephone Encounter (Signed)
She is seeing Neurologist for headaches.  Denied.

## 2015-11-06 NOTE — Telephone Encounter (Signed)
Get last 3 OV notes from HA specialist for me to review please.

## 2015-11-06 NOTE — Telephone Encounter (Signed)
HA specialist is giving her Trazodone for her headaches.  Told her to keep getting her Hydrocodone and Valium from [place].  The Trazodone and Hydrocodone have her down to 3 HA's a month.

## 2015-11-06 NOTE — Telephone Encounter (Signed)
LMTCB

## 2015-11-13 NOTE — Telephone Encounter (Signed)
She needs to be seeing a Headache specialist.  In addition, she has been lying to us---multiple lies----that she was going to headache specialist--and trazadone--- Inform pt that we will Rx no pain meds or Trazadone and  FORWARD TO [facility] TO DISMISS PT FROM PRACTICE.

## 2015-11-13 NOTE — Telephone Encounter (Signed)
To office manager .

## 2015-11-13 NOTE — Telephone Encounter (Signed)
Received return call from patient.   Reports that she is seeing HA Specialist that we referred her to. Referral was placed in 2015 for Dr. Domingo Cocking at [facility]. Call placed to center. Was advised that patient came in April 2016 for initial visit, but has not been seen since.   Call placed to pharmacy to verify prescriber for Trazodone. Was advised that no prescription for Trazodone noted. Patient has only filled medications as follows: Valium 5/2 Metaxalone 3/30 Hydrocodone 3/30 Chantix 3/8 Hycodan 3/8 Z Pack 3/8 Hydrocodone 2/16 Flexeril 2/16  Call placed to [facility] (Dr. Catalina Gravel) and [facility] (Dr. Joretta Bachelor). Was advised that patient has not been seen at those practices.  Please advise.

## 2015-11-13 NOTE — Telephone Encounter (Signed)
Call placed to patient. LMTRC.  

## 2015-11-20 ENCOUNTER — Encounter: Payer: Self-pay | Admitting: Family Medicine

## 2016-05-01 ENCOUNTER — Encounter: Payer: Self-pay | Admitting: Family Medicine

## 2016-09-26 ENCOUNTER — Encounter: Payer: Self-pay | Admitting: Family Medicine

## 2016-09-26 ENCOUNTER — Other Ambulatory Visit: Payer: Self-pay | Admitting: Family Medicine

## 2016-09-26 MED ORDER — CITALOPRAM HYDROBROMIDE 20 MG PO TABS: 20.0000 mg | ORAL_TABLET | Freq: Every day | ORAL | 3 refills | Status: AC

## 2016-09-26 NOTE — Evaluation (Signed)
Situation is complicated. Please see the office visits today for [facility] and Zollie Pee.  They were coming in for follow-up of there ADHD. Sally [name] is extremely depressed. He is even discussing suicidal thoughts. Although he is very flippant about it, even the fact that he is discussing this and mentioning this is concerning to me. He also complains of depression, sadness, anxiety, and anhedonia. Sally [name] and Sally [name] have both missed more than 20 days of school. They simply will not get on the school bus. Mom states she is too busy to follow up on this or carry them to school. The stepfather has been incarcerated. Sally [name] is their mother and throughout the visit today, she is on the verge of crying. She states that she is working 2 jobs. She has no insurance. She cannot pay her bills. Her daughter is having panic attacks. She feels like she is at her wits end. She reports depression, daily panic attacks, anhedonia, excessive anxiety. It is my firm belief that she is clinically depressed and in a very bad situation. As result I feel Sally [name] and Sally [name] or picking up on this. This could be causing the anxiety in their sister as well as the depression in Sally [name]. Furthermore, the mom's apathy and lack of energy is allowing the children to roam free with no supervision. As a result they're not going to school like they should and therefore faltering. Therefore I believe I need to treat her depression. Even though we have dismissed her as a patient, in good faith, I will do what I can to try to help her so that I can also help her children who are still my patients. Therefore I'll start the patient on Celexa 20 mg a day and recheck her with Sally [name] in one month. She will not be charged for this.

## 2016-09-26 NOTE — Progress Notes (Signed)
Situation is complicated. Please see the office visits today for [facility] and Zollie Pee.  They were coming in for follow-up of there ADHD. Darrick Meigs is extremely depressed. He is even discussing suicidal thoughts. Although he is very flippant about it, even the fact that he is discussing this and mentioning this is concerning to me. He also complains of depression, sadness, anxiety, and anhedonia. Annie Main and Darrick Meigs have both missed more than 20 days of school. They simply will not get on the school bus. Mom states she is too busy to follow up on this or carry them to school. The stepfather has been incarcerated. Chong is their mother and throughout the visit today, she is on the verge of crying. She states that she is working 2 jobs. She has no insurance. She cannot pay her bills. Her daughter is having panic attacks. She feels like she is at her wits end. She reports depression, daily panic attacks, anhedonia, excessive anxiety. It is my firm belief that she is clinically depressed and in a very bad situation. As result I feel Darrick Meigs and Annie Main or picking up on this. This could be causing the anxiety in their sister as well as the depression in Christian. Furthermore, the mom's apathy and lack of energy is allowing the children to roam free with no supervision. As a result they're not going to school like they should and therefore faltering. Therefore I believe I need to treat her depression. Even though we have dismissed her as a patient, in good faith, I will do what I can to try to help her so that I can also help her children who are still my patients. Therefore I'll start the patient on Celexa 20 mg a day and recheck her with Darrick Meigs in one month. She will not be charged for this.

## 2016-10-26 IMAGING — CT CT HEAD W/O CM
1 series · 15 of 30 positions shown, 19 images · non-contrast
Comparison: CT of the head performed 05/12/2014

CLINICAL DATA: Acute onset of altered level of consciousness after
drug overdose. Initial encounter.

EXAM:
CT HEAD WITHOUT CONTRAST
TECHNIQUE: Contiguous axial images were obtained from the base of the skull
through the vertex without intravenous contrast.

[Series 2: headtrauma 4.8 h37s · axial · 0.45mm/px · z∈[+75,+210]mm · 15 of 30 slices shown, 19 images]
[im 2/30  brain]
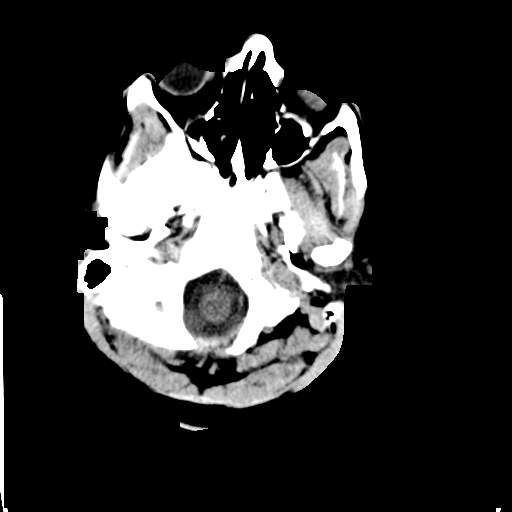
[im 2/30  bone]
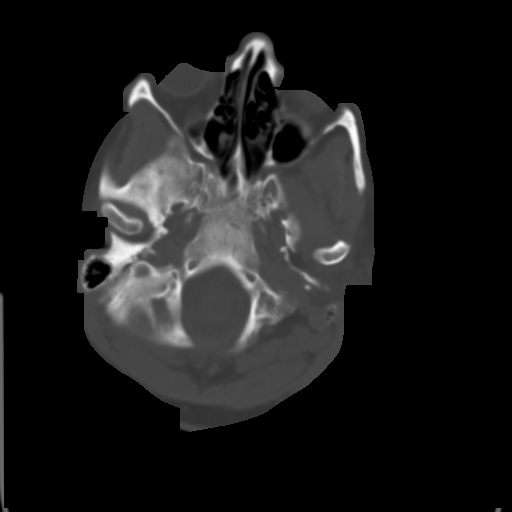
[im 4/30  brain]
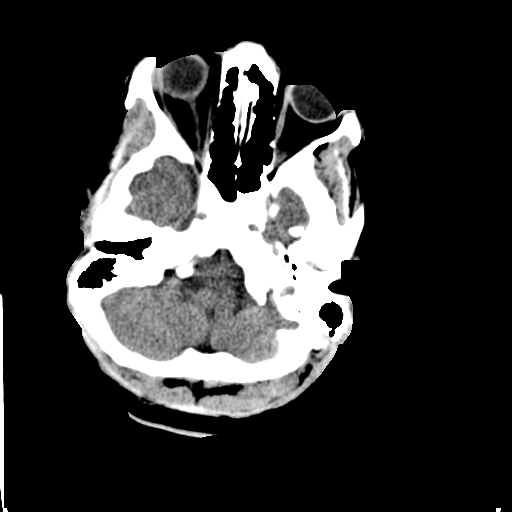
[im 6/30  brain]
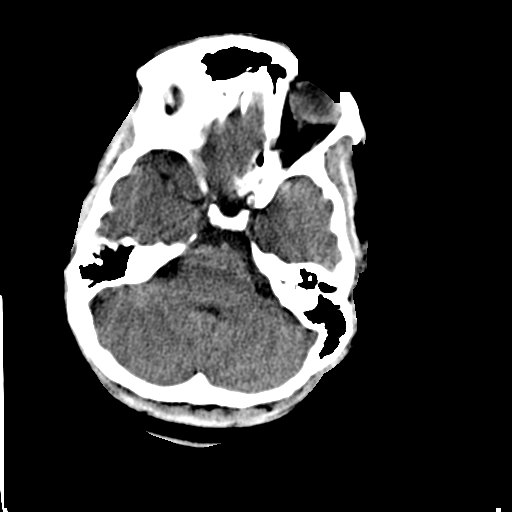
[im 8/30  brain]
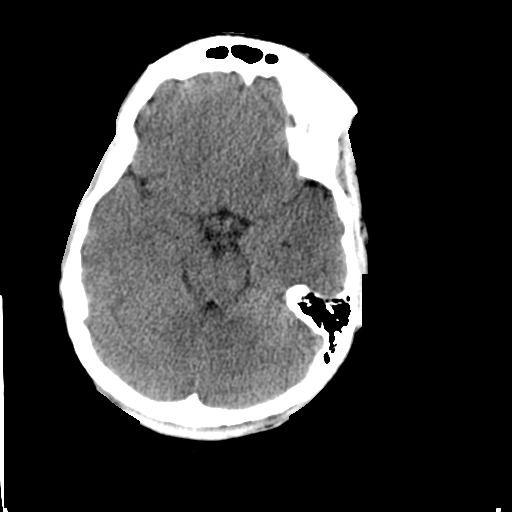
[im 10/30  brain]
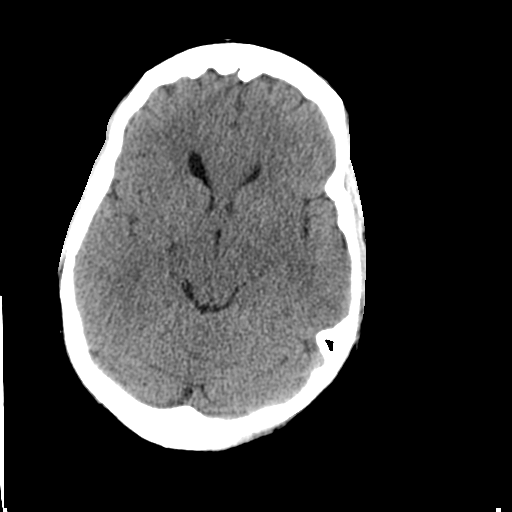
[im 10/30  bone]
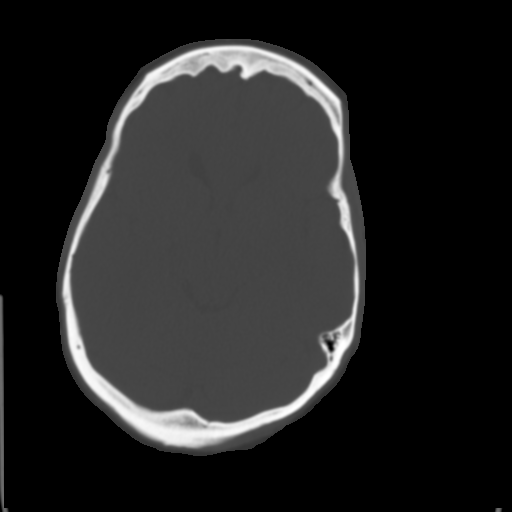
[im 12/30  brain]
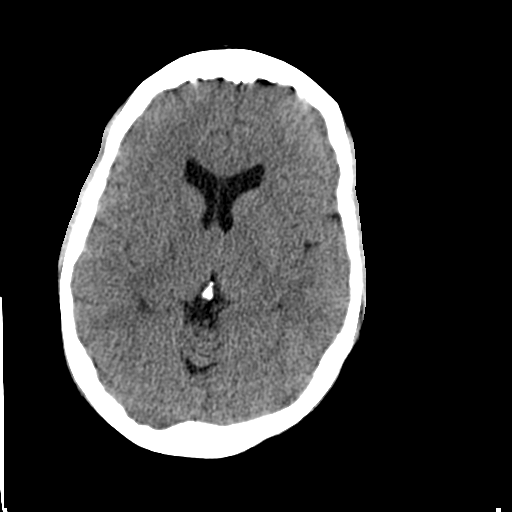
[im 14/30  brain]
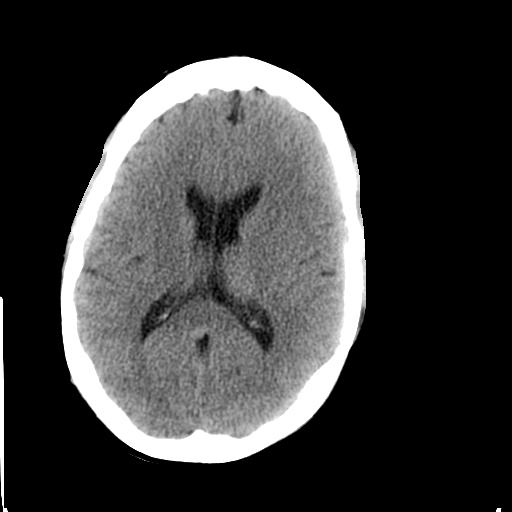
[im 16/30  brain]
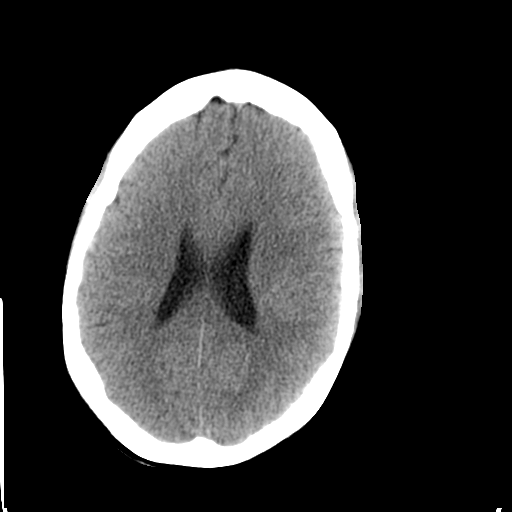
[im 17/30  brain]
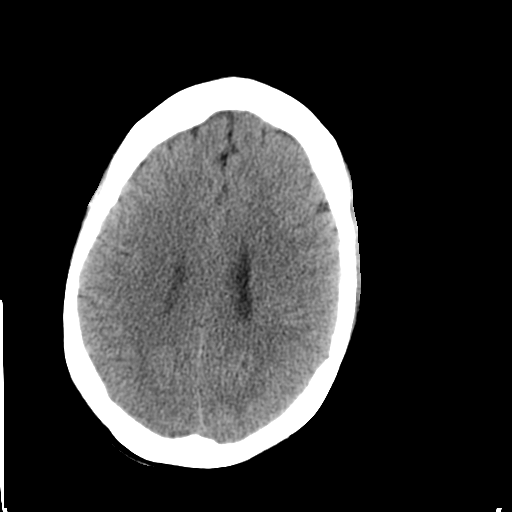
[im 17/30  bone]
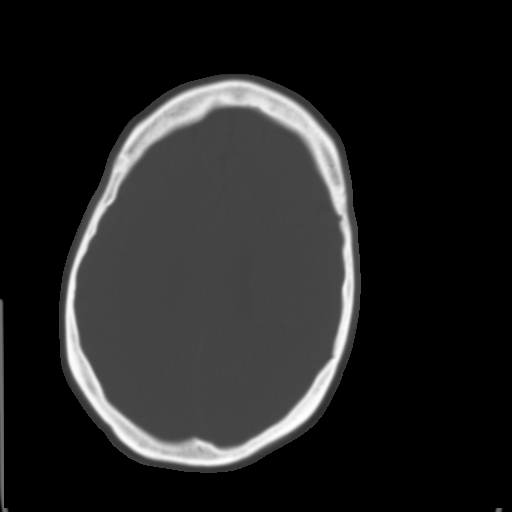
[im 19/30  brain]
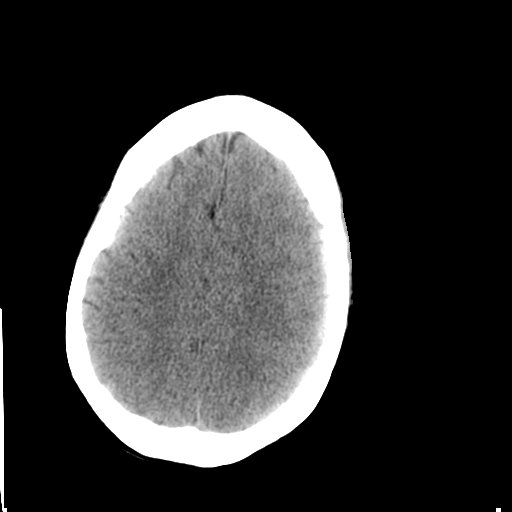
[im 21/30  brain]
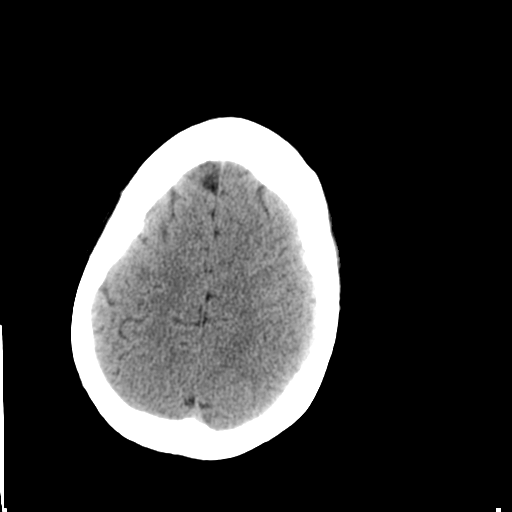
[im 23/30  brain]
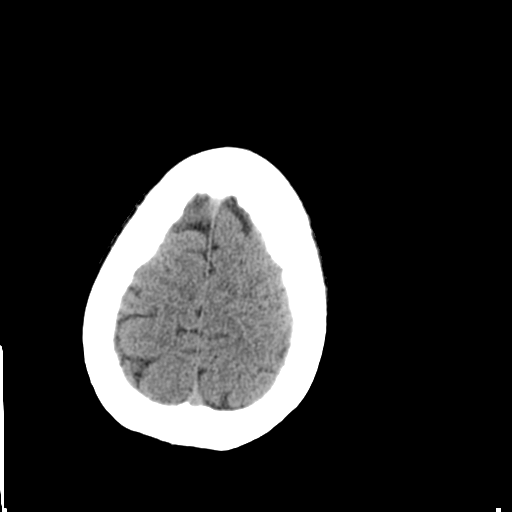
[im 25/30  brain]
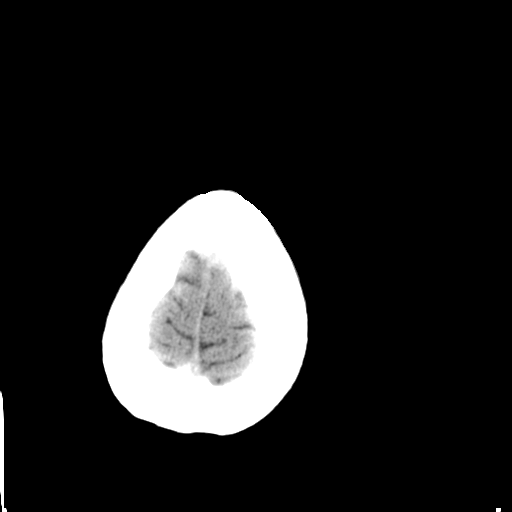
[im 25/30  bone]
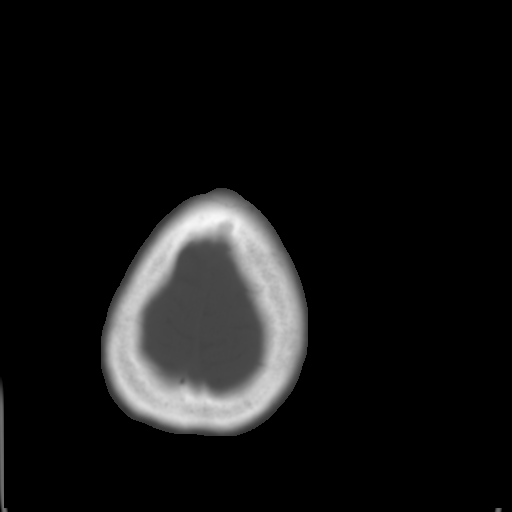
[im 27/30  brain]
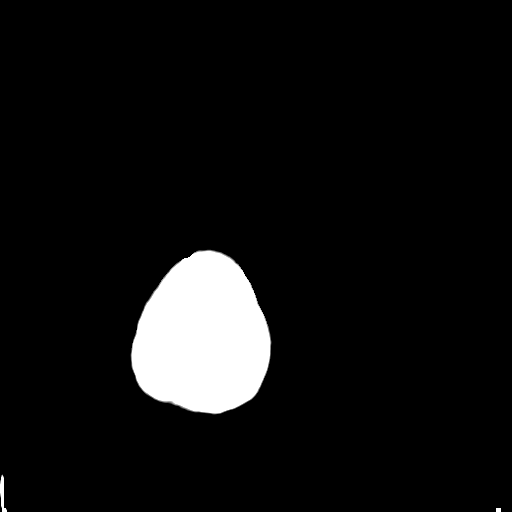
[im 29/30  brain]
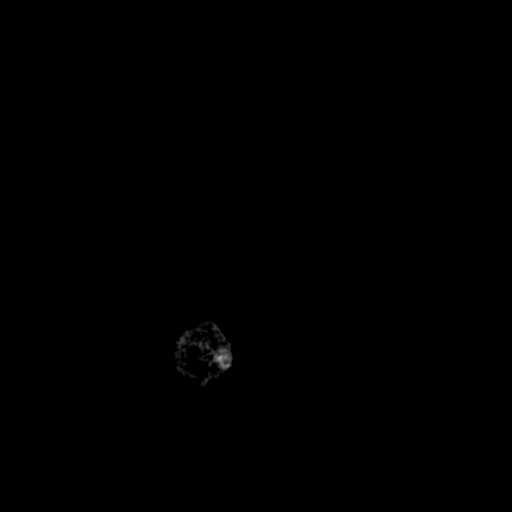

[15 of 30 positions shown; findings below may reference images not displayed]

FINDINGS: There is no evidence of acute infarction, mass lesion, or intra- or
extra-axial hemorrhage on CT.

The posterior fossa, including the cerebellum, brainstem and fourth
ventricle, is within normal limits. The third and lateral
ventricles, and basal ganglia are unremarkable in appearance. The
cerebral hemispheres are symmetric in appearance, with normal
gray-white differentiation. No mass effect or midline shift is seen.

There is no evidence of fracture; visualized osseous structures are
unremarkable in appearance. The visualized portions of the orbits
are within normal limits. The paranasal sinuses and mastoid air
cells are well-aerated. No significant soft tissue abnormalities are
seen.
IMPRESSION: Unremarkable noncontrast CT of the head.

## 2016-10-27 ENCOUNTER — Other Ambulatory Visit: Payer: Self-pay | Admitting: Family Medicine

## 2016-10-27 ENCOUNTER — Encounter: Payer: Self-pay | Admitting: Family Medicine

## 2016-10-27 DIAGNOSIS — F3289 Other specified depressive episodes: Secondary | ICD-10-CM

## 2016-10-27 MED ORDER — LITHIUM CARBONATE 300 MG PO TABS: 300.0000 mg | ORAL_TABLET | Freq: Two times a day (BID) | ORAL | 1 refills | Status: DC

## 2016-10-27 NOTE — Progress Notes (Signed)
Please see my documentation from March 23rd. Patient is here for follow-up. She is currently on Celexa 20 mg by mouth daily for depression. She states that this has helped some with depression however she continues to have overwhelming anxiety. She also reports occasional thoughts of wanting to hurt herself. They're nonspecific. She denies any plan. She did have her boyfriend remove all her guns from her home as a safety precaution. She also reports a strong family history of bipolar disorder including in her father and in her mother. She denies any insomnia. She denies any delusions or hallucinations. She does report frequent mood swings and excessive anxiety. She denies any current impulsive behavior.  She continues to have no insurance and therefore is unable to afford most medications. I believe that we need to augment her antidepressant. Ideally I would like to use Lamictal but this is going to be unaffordable. I would like a medication that can augment her antidepressant and also prevent mania in a patient who may have underlying bipolar tendencies. Therefore I'm going to prescribe the patient lithium off label 300 mg by mouth daily at bedtime for 1 week and then increase to 300 mg by mouth twice a day and then recheck in 3 weeks. I'm choosing this medication because its on the $4 list at [facility] and is affordable for the patient.  I recommended that the patient contact the Social Security office in [facility] and apply for [employer]. I believe she would benefit from seeing a psychiatrist.

## 2017-04-08 ENCOUNTER — Encounter (HOSPITAL_COMMUNITY): Payer: Self-pay

## 2017-04-08 ENCOUNTER — Emergency Department (HOSPITAL_COMMUNITY): Payer: Self-pay

## 2017-04-08 ENCOUNTER — Inpatient Hospital Stay (HOSPITAL_COMMUNITY)
Admission: EM | Admit: 2017-04-08 | Discharge: 2017-04-12 | DRG: 854 | Disposition: A | Discharge status: Home / Self Care | Payer: Self-pay | Attending: Internal Medicine | Admitting: Internal Medicine

## 2017-04-08 DIAGNOSIS — N23 Unspecified renal colic: Secondary | ICD-10-CM

## 2017-04-08 DIAGNOSIS — B9620 Unspecified Escherichia coli [E. coli] as the cause of diseases classified elsewhere: Secondary | ICD-10-CM | POA: Diagnosis present

## 2017-04-08 DIAGNOSIS — Z886 Allergy status to analgesic agent status: Secondary | ICD-10-CM

## 2017-04-08 DIAGNOSIS — A419 Sepsis, unspecified organism: Principal | ICD-10-CM | POA: Diagnosis present

## 2017-04-08 DIAGNOSIS — N10 Acute tubulo-interstitial nephritis: Secondary | ICD-10-CM | POA: Diagnosis present

## 2017-04-08 DIAGNOSIS — N111 Chronic obstructive pyelonephritis: Secondary | ICD-10-CM | POA: Diagnosis present

## 2017-04-08 DIAGNOSIS — N202 Calculus of kidney with calculus of ureter: Secondary | ICD-10-CM | POA: Diagnosis present

## 2017-04-08 DIAGNOSIS — R509 Fever, unspecified: Secondary | ICD-10-CM

## 2017-04-08 DIAGNOSIS — N390 Urinary tract infection, site not specified: Secondary | ICD-10-CM | POA: Diagnosis present

## 2017-04-08 DIAGNOSIS — F17210 Nicotine dependence, cigarettes, uncomplicated: Secondary | ICD-10-CM | POA: Diagnosis present

## 2017-04-08 DIAGNOSIS — Z79899 Other long term (current) drug therapy: Secondary | ICD-10-CM

## 2017-04-08 DIAGNOSIS — E876 Hypokalemia: Secondary | ICD-10-CM | POA: Diagnosis present

## 2017-04-08 DIAGNOSIS — N200 Calculus of kidney: Secondary | ICD-10-CM

## 2017-04-08 DIAGNOSIS — Z885 Allergy status to narcotic agent status: Secondary | ICD-10-CM

## 2017-04-08 DIAGNOSIS — N12 Tubulo-interstitial nephritis, not specified as acute or chronic: Secondary | ICD-10-CM

## 2017-04-08 DIAGNOSIS — N136 Pyonephrosis: Secondary | ICD-10-CM | POA: Diagnosis present

## 2017-04-08 DIAGNOSIS — N201 Calculus of ureter: Secondary | ICD-10-CM

## 2017-04-08 HISTORY — DX: Disorder of kidney and ureter, unspecified: N28.9

## 2017-04-08 LAB — BASIC METABOLIC PANEL
Anion gap: 11 (ref 5–15)
BUN: 17 mg/dL (ref 6–20)
CO2: 25 mmol/L (ref 22–32)
CREATININE: 0.84 mg/dL (ref 0.44–1.00)
Calcium: 9.3 mg/dL (ref 8.9–10.3)
Chloride: 98 mmol/L — ABNORMAL LOW (ref 101–111)
GFR calc Af Amer: >60 mL/min (ref >60)
GLUCOSE: 97 mg/dL (ref 65–99)
Potassium: 3.5 mmol/L (ref 3.5–5.1)
SODIUM: 134 mmol/L — AB (ref 135–145)

## 2017-04-08 LAB — CBC WITH DIFFERENTIAL/PLATELET
Basophils Absolute: 0 10*3/uL (ref 0.0–0.1)
Basophils Relative: 0 %
EOS ABS: 0.1 10*3/uL (ref 0.0–0.7)
Eosinophils Relative: 1 %
HCT: 42.3 % (ref 36.0–46.0)
Hemoglobin: 14.1 g/dL (ref 12.0–15.0)
LYMPHS ABS: 2.7 10*3/uL (ref 0.7–4.0)
Lymphocytes Relative: 18 %
MCH: 30.8 pg (ref 26.0–34.0)
MCHC: 33.3 g/dL (ref 30.0–36.0)
MCV: 92.4 fL (ref 78.0–100.0)
MONO ABS: 1.3 10*3/uL — AB (ref 0.1–1.0)
Monocytes Relative: 8 %
Neutro Abs: 11.4 10*3/uL — ABNORMAL HIGH (ref 1.7–7.7)
Neutrophils Relative %: 74 %
Platelets: 275 10*3/uL (ref 150–400)
RBC: 4.58 MIL/uL (ref 3.87–5.11)
RDW: 12.9 % (ref 11.5–15.5)
WBC: 15.5 10*3/uL — AB (ref 4.0–10.5)

## 2017-04-08 LAB — PREGNANCY, URINE: Preg Test, Ur: NEGATIVE

## 2017-04-08 LAB — URINALYSIS, ROUTINE W REFLEX MICROSCOPIC
Bilirubin Urine: NEGATIVE
Glucose, UA: NEGATIVE mg/dL
KETONES UR: NEGATIVE mg/dL
Nitrite: POSITIVE — AB
PH: 6 (ref 5.0–8.0)
Protein, ur: 30 mg/dL — AB
SPECIFIC GRAVITY, URINE: 1.016 (ref 1.005–1.030)

## 2017-04-08 NOTE — ED Triage Notes (Signed)
Pt reports left flank pain and vomiting x 3 days.  Reports history of kidney stone 5 years ago.

## 2017-04-09 ENCOUNTER — Encounter (HOSPITAL_COMMUNITY): Payer: Self-pay | Admitting: Internal Medicine

## 2017-04-09 ENCOUNTER — Inpatient Hospital Stay (HOSPITAL_COMMUNITY): Payer: Self-pay | Admitting: Anesthesiology

## 2017-04-09 ENCOUNTER — Encounter (HOSPITAL_COMMUNITY)
Admission: EM | Disposition: A | Discharge status: Home / Self Care | Payer: Self-pay | Source: Home / Self Care | Attending: Internal Medicine

## 2017-04-09 ENCOUNTER — Inpatient Hospital Stay (HOSPITAL_COMMUNITY): Payer: Self-pay

## 2017-04-09 DIAGNOSIS — N200 Calculus of kidney: Secondary | ICD-10-CM

## 2017-04-09 DIAGNOSIS — N390 Urinary tract infection, site not specified: Secondary | ICD-10-CM | POA: Diagnosis present

## 2017-04-09 DIAGNOSIS — E876 Hypokalemia: Secondary | ICD-10-CM

## 2017-04-09 DIAGNOSIS — N12 Tubulo-interstitial nephritis, not specified as acute or chronic: Secondary | ICD-10-CM

## 2017-04-09 DIAGNOSIS — N132 Hydronephrosis with renal and ureteral calculous obstruction: Secondary | ICD-10-CM

## 2017-04-09 DIAGNOSIS — N3001 Acute cystitis with hematuria: Secondary | ICD-10-CM

## 2017-04-09 DIAGNOSIS — R509 Fever, unspecified: Secondary | ICD-10-CM

## 2017-04-09 HISTORY — PX: CYSTOSCOPY W/ URETERAL STENT PLACEMENT: SHX1429

## 2017-04-09 LAB — COMPREHENSIVE METABOLIC PANEL
ALT: 14 U/L (ref 14–54)
ANION GAP: 9 (ref 5–15)
AST: 19 U/L (ref 15–41)
Albumin: 3.4 g/dL — ABNORMAL LOW (ref 3.5–5.0)
Alkaline Phosphatase: 65 U/L (ref 38–126)
BILIRUBIN TOTAL: 0.8 mg/dL (ref 0.3–1.2)
BUN: 16 mg/dL (ref 6–20)
CALCIUM: 8.8 mg/dL — AB (ref 8.9–10.3)
CO2: 27 mmol/L (ref 22–32)
Chloride: 100 mmol/L — ABNORMAL LOW (ref 101–111)
Creatinine, Ser: 0.87 mg/dL (ref 0.44–1.00)
GFR calc Af Amer: >60 mL/min (ref >60)
Glucose, Bld: 109 mg/dL — ABNORMAL HIGH (ref 65–99)
POTASSIUM: 3.2 mmol/L — AB (ref 3.5–5.1)
Sodium: 136 mmol/L (ref 135–145)
TOTAL PROTEIN: 5.9 g/dL — AB (ref 6.5–8.1)

## 2017-04-09 LAB — CBC
HEMATOCRIT: 37.3 % (ref 36.0–46.0)
HEMOGLOBIN: 12.6 g/dL (ref 12.0–15.0)
MCH: 31.7 pg (ref 26.0–34.0)
MCHC: 33.8 g/dL (ref 30.0–36.0)
MCV: 93.7 fL (ref 78.0–100.0)
Platelets: 233 10*3/uL (ref 150–400)
RBC: 3.98 MIL/uL (ref 3.87–5.11)
RDW: 13.1 % (ref 11.5–15.5)
WBC: 11.7 10*3/uL — AB (ref 4.0–10.5)

## 2017-04-09 LAB — LACTIC ACID, PLASMA: LACTIC ACID, VENOUS: 1.7 mmol/L (ref 0.5–1.9)

## 2017-04-09 SURGERY — CYSTOSCOPY, WITH RETROGRADE PYELOGRAM AND URETERAL STENT INSERTION
Anesthesia: General | Laterality: Left

## 2017-04-09 MED ORDER — SODIUM CHLORIDE 0.9 % IV SOLN
INTRAVENOUS | Status: AC
  Administered 2017-04-09 – 2017-04-10 (×3): via INTRAVENOUS

## 2017-04-09 MED ORDER — ACETAMINOPHEN 650 MG RE SUPP: 650.0000 mg | Freq: Four times a day (QID) | RECTAL | Status: DC | PRN

## 2017-04-09 MED ORDER — FENTANYL CITRATE (PF) 100 MCG/2ML IJ SOLN
INTRAMUSCULAR | Status: AC
  Filled 2017-04-09: qty 2

## 2017-04-09 MED ORDER — METOPROLOL TARTRATE 5 MG/5ML IV SOLN
INTRAVENOUS | Status: DC | PRN
  Administered 2017-04-09 (×5): 1 mg via INTRAVENOUS

## 2017-04-09 MED ORDER — OXYBUTYNIN CHLORIDE 5 MG PO TABS
5.0000 mg | ORAL_TABLET | Freq: Three times a day (TID) | ORAL | Status: DC | PRN
Start: 2017-04-09 — End: 2017-04-12
  Administered 2017-04-09 – 2017-04-10 (×2): 5 mg via ORAL
  Filled 2017-04-09 (×2): qty 1

## 2017-04-09 MED ORDER — METOPROLOL TARTRATE 5 MG/5ML IV SOLN
INTRAVENOUS | Status: AC
  Filled 2017-04-09: qty 5

## 2017-04-09 MED ORDER — OXYCODONE-ACETAMINOPHEN 5-325 MG PO TABS
1.0000 | ORAL_TABLET | Freq: Four times a day (QID) | ORAL | Status: DC | PRN
  Administered 2017-04-09 – 2017-04-10 (×4): 1 via ORAL
  Filled 2017-04-09 (×5): qty 1

## 2017-04-09 MED ORDER — HYDROMORPHONE HCL 1 MG/ML IJ SOLN
1.0000 mg | Freq: Once | INTRAMUSCULAR | Status: AC
  Administered 2017-04-09: 1 mg via INTRAVENOUS
  Filled 2017-04-09: qty 1

## 2017-04-09 MED ORDER — MIDAZOLAM HCL 2 MG/2ML IJ SOLN
1.0000 mg | INTRAMUSCULAR | Status: DC
  Administered 2017-04-09: 2 mg via INTRAVENOUS

## 2017-04-09 MED ORDER — GLYCOPYRROLATE 0.2 MG/ML IJ SOLN
0.2000 mg | Freq: Once | INTRAMUSCULAR | Status: AC | PRN
  Administered 2017-04-09: 0.2 mg via INTRAVENOUS

## 2017-04-09 MED ORDER — ONDANSETRON HCL 4 MG/2ML IJ SOLN
4.0000 mg | Freq: Four times a day (QID) | INTRAMUSCULAR | Status: DC | PRN
  Administered 2017-04-09: 4 mg via INTRAVENOUS
  Filled 2017-04-09: qty 2

## 2017-04-09 MED ORDER — METAXALONE 800 MG PO TABS
800.0000 mg | ORAL_TABLET | Freq: Four times a day (QID) | ORAL | Status: DC
Start: 2017-04-09 — End: 2017-04-12
  Administered 2017-04-09 – 2017-04-12 (×10): 800 mg via ORAL
  Filled 2017-04-09 (×11): qty 1

## 2017-04-09 MED ORDER — LACTATED RINGERS IV SOLN
INTRAVENOUS | Status: DC
  Administered 2017-04-09: 09:00:00 via INTRAVENOUS

## 2017-04-09 MED ORDER — FENTANYL CITRATE (PF) 100 MCG/2ML IJ SOLN
INTRAMUSCULAR | Status: DC | PRN
  Administered 2017-04-09: 25 ug via INTRAVENOUS
  Administered 2017-04-09: 50 ug via INTRAVENOUS
  Administered 2017-04-09: 25 ug via INTRAVENOUS

## 2017-04-09 MED ORDER — ENOXAPARIN SODIUM 40 MG/0.4ML ~~LOC~~ SOLN
40.0000 mg | SUBCUTANEOUS | Status: DC
  Administered 2017-04-10: 40 mg via SUBCUTANEOUS
  Filled 2017-04-09 (×3): qty 0.4

## 2017-04-09 MED ORDER — SODIUM CHLORIDE 0.9 % IR SOLN
Status: DC | PRN
  Administered 2017-04-09: 3000 mL via INTRAVESICAL

## 2017-04-09 MED ORDER — MEPERIDINE HCL 50 MG/ML IJ SOLN
10.0000 mg | INTRAMUSCULAR | Status: AC | PRN
  Administered 2017-04-09 (×2): 10 mg via INTRAVENOUS

## 2017-04-09 MED ORDER — DIATRIZOATE MEGLUMINE 30 % UR SOLN
URETHRAL | Status: DC | PRN
  Administered 2017-04-09: 10 mL

## 2017-04-09 MED ORDER — FENTANYL CITRATE (PF) 100 MCG/2ML IJ SOLN: 25.0000 ug | INTRAMUSCULAR | Status: DC | PRN

## 2017-04-09 MED ORDER — PROMETHAZINE HCL 25 MG/ML IJ SOLN: 6.2500 mg | Freq: Four times a day (QID) | INTRAMUSCULAR | Status: DC | PRN

## 2017-04-09 MED ORDER — ONDANSETRON HCL 4 MG/2ML IJ SOLN
4.0000 mg | Freq: Once | INTRAMUSCULAR | Status: AC
Start: 2017-04-09 — End: 2017-04-09
  Administered 2017-04-09: 4 mg via INTRAVENOUS

## 2017-04-09 MED ORDER — MEPERIDINE HCL 50 MG/ML IJ SOLN
INTRAMUSCULAR | Status: AC
  Filled 2017-04-09: qty 1

## 2017-04-09 MED ORDER — HYDROMORPHONE HCL 1 MG/ML IJ SOLN
0.5000 mg | INTRAMUSCULAR | Status: DC | PRN
  Administered 2017-04-09 – 2017-04-11 (×7): 0.5 mg via INTRAVENOUS
  Filled 2017-04-09 (×7): qty 1

## 2017-04-09 MED ORDER — KETOROLAC TROMETHAMINE 30 MG/ML IJ SOLN
30.0000 mg | Freq: Once | INTRAMUSCULAR | Status: AC
  Administered 2017-04-09: 30 mg via INTRAVENOUS
  Filled 2017-04-09: qty 1

## 2017-04-09 MED ORDER — GLYCOPYRROLATE 0.2 MG/ML IJ SOLN
INTRAMUSCULAR | Status: AC
  Filled 2017-04-09: qty 1

## 2017-04-09 MED ORDER — PROPRANOLOL HCL ER 60 MG PO CP24
60.0000 mg | ORAL_CAPSULE | Freq: Every day | ORAL | Status: DC
  Administered 2017-04-10 – 2017-04-12 (×2): 60 mg via ORAL
  Filled 2017-04-09 (×5): qty 1

## 2017-04-09 MED ORDER — DIATRIZOATE MEGLUMINE 30 % UR SOLN
URETHRAL | Status: AC
  Filled 2017-04-09: qty 300

## 2017-04-09 MED ORDER — LIDOCAINE HCL (CARDIAC) 10 MG/ML IV SOLN
INTRAVENOUS | Status: DC | PRN
  Administered 2017-04-09: 30 mg via INTRAVENOUS

## 2017-04-09 MED ORDER — DEXTROSE 5 % IV SOLN
1.0000 g | INTRAVENOUS | Status: DC
  Administered 2017-04-09 – 2017-04-12 (×4): 1 g via INTRAVENOUS
  Filled 2017-04-09 (×6): qty 10

## 2017-04-09 MED ORDER — MIDAZOLAM HCL 2 MG/2ML IJ SOLN
INTRAMUSCULAR | Status: AC
  Filled 2017-04-09: qty 2

## 2017-04-09 MED ORDER — FENTANYL CITRATE (PF) 100 MCG/2ML IJ SOLN
25.0000 ug | Freq: Once | INTRAMUSCULAR | Status: AC
  Administered 2017-04-09: 25 ug via INTRAVENOUS

## 2017-04-09 MED ORDER — SODIUM CHLORIDE 0.9 % IV BOLUS (SEPSIS)
1000.0000 mL | Freq: Once | INTRAVENOUS | Status: AC
  Administered 2017-04-09: 1000 mL via INTRAVENOUS

## 2017-04-09 MED ORDER — CITALOPRAM HYDROBROMIDE 20 MG PO TABS
20.0000 mg | ORAL_TABLET | Freq: Every day | ORAL | Status: DC
  Administered 2017-04-10 – 2017-04-12 (×3): 20 mg via ORAL
  Filled 2017-04-09 (×3): qty 1

## 2017-04-09 MED ORDER — PROPOFOL 10 MG/ML IV BOLUS
INTRAVENOUS | Status: DC | PRN
  Administered 2017-04-09: 160 mg via INTRAVENOUS

## 2017-04-09 MED ORDER — POTASSIUM CHLORIDE CRYS ER 20 MEQ PO TBCR
40.0000 meq | EXTENDED_RELEASE_TABLET | Freq: Once | ORAL | Status: AC
  Administered 2017-04-09: 40 meq via ORAL
  Filled 2017-04-09: qty 2

## 2017-04-09 MED ORDER — LEVOFLOXACIN IN D5W 500 MG/100ML IV SOLN
500.0000 mg | Freq: Once | INTRAVENOUS | Status: AC
  Administered 2017-04-09: 500 mg via INTRAVENOUS
  Filled 2017-04-09: qty 100

## 2017-04-09 MED ORDER — ACETAMINOPHEN 325 MG PO TABS
650.0000 mg | ORAL_TABLET | Freq: Four times a day (QID) | ORAL | Status: DC | PRN
  Administered 2017-04-09 – 2017-04-10 (×3): 650 mg via ORAL
  Filled 2017-04-09 (×3): qty 2

## 2017-04-09 MED ORDER — PROMETHAZINE HCL 25 MG/ML IJ SOLN
12.5000 mg | Freq: Once | INTRAMUSCULAR | Status: AC
  Administered 2017-04-09: 12.5 mg via INTRAVENOUS
  Filled 2017-04-09: qty 1

## 2017-04-09 MED ORDER — DIAZEPAM 2 MG PO TABS
2.0000 mg | ORAL_TABLET | Freq: Four times a day (QID) | ORAL | Status: DC | PRN
  Administered 2017-04-09: 2 mg via ORAL
  Filled 2017-04-09: qty 1

## 2017-04-09 MED ORDER — ONDANSETRON HCL 4 MG/2ML IJ SOLN
INTRAMUSCULAR | Status: AC
  Filled 2017-04-09: qty 2

## 2017-04-09 SURGICAL SUPPLY — 20 items
BAG DRAIN URO TABLE W/ADPT NS (DRAPE) ×2 IMPLANT
BAG DRN 8 ADPR NS SKTRN CSTL (DRAPE) ×1
BAG HAMPER (MISCELLANEOUS) ×2 IMPLANT
CATH INTERMIT  6FR 70CM (CATHETERS) ×2 IMPLANT
CLOTH BEACON ORANGE TIMEOUT ST (SAFETY) ×2 IMPLANT
DECANTER SPIKE VIAL GLASS SM (MISCELLANEOUS) ×2 IMPLANT
GLOVE BIO SURGEON STRL SZ 6.5 (GLOVE) ×1 IMPLANT
GLOVE BIOGEL M 8.0 STRL (GLOVE) ×2 IMPLANT
GLOVE BIOGEL PI IND STRL 7.0 (GLOVE) IMPLANT
GLOVE BIOGEL PI INDICATOR 7.0 (GLOVE) ×1
GLOVE SURG SS PI 6.5 STRL IVOR (GLOVE) ×1 IMPLANT
GOWN STRL REIN XL XLG (GOWN DISPOSABLE) ×2 IMPLANT
GUIDEWIRE STR DUAL SENSOR (WIRE) ×2 IMPLANT
IV NS IRRIG 3000ML ARTHROMATIC (IV SOLUTION) ×2 IMPLANT
KIT ROOM TURNOVER AP CYSTO (KITS) ×2 IMPLANT
MANIFOLD NEPTUNE II (INSTRUMENTS) ×2 IMPLANT
PACK CYSTO (CUSTOM PROCEDURE TRAY) ×2 IMPLANT
PAD ARMBOARD 7.5X6 YLW CONV (MISCELLANEOUS) ×2 IMPLANT
STENT URET 6FRX24 CONTOUR (STENTS) ×1 IMPLANT
WATER STERILE IRR 1000ML POUR (IV SOLUTION) ×2 IMPLANT

## 2017-04-09 NOTE — Anesthesia Preprocedure Evaluation (Signed)
Anesthesia Evaluation  Patient identified by MRN, date of birth, ID band Patient awake    Reviewed: Allergy & Precautions, NPO status , Patient's Chart, lab work & pertinent test results  Airway Mallampati: I  TM Distance: >3 FB Neck ROM: Full    Dental  (+) Teeth Intact   Pulmonary Current Smoker,    breath sounds clear to auscultation       Cardiovascular negative cardio ROS   Rhythm:Regular Rate:Normal     Neuro/Psych  Headaches,    GI/Hepatic negative GI ROS, Neg liver ROS,   Endo/Other  negative endocrine ROS  Renal/GU Renal disease     Musculoskeletal   Abdominal   Peds  Hematology negative hematology ROS (+)   Anesthesia Other Findings   Reproductive/Obstetrics                             Anesthesia Physical Anesthesia Plan  ASA: II  Anesthesia Plan: General   Post-op Pain Management:    Induction: Intravenous  PONV Risk Score and Plan:   Airway Management Planned: LMA  Additional Equipment:   Intra-op Plan:   Post-operative Plan: Extubation in OR  Informed Consent: I have reviewed the patients History and Physical, chart, labs and discussed the procedure including the risks, benefits and alternatives for the proposed anesthesia with the patient or authorized representative who has indicated his/her understanding and acceptance.     Plan Discussed with:   Anesthesia Plan Comments:         Anesthesia Quick Evaluation

## 2017-04-09 NOTE — Op Note (Signed)
Preoperative diagnosis: Left proximal ureteral stone with urinary tract infection/pyonephrosis.  Postoperative diagnosis: Same.  Principal procedure: Cystoscopy, left retrograde ureteropyelogram, fluoroscopic interpretation, placement of 6 by 24 centimeter contour double-J stent without tether.  Surgeon: Diona Fanti  Anesthesia: Gen.  Complications: None.  Specimen: None.  Drains: 6 French by 24 centimeter contour double-J stent without tether.  Indications: 36 year old female with recent presentation to the emergency room where a left proximal ureteral stone was found.  She been symptomatic for proximally 10 days and had an associated urinary tract infection.  She presents at this time for urgent cystoscopy and double-J stent placement.  I discussed the procedure with the patient, the reason for stent placement only at this point, with follow-up definitive management of the stone.  She understands the procedure and the expected outcome, as well as risks.  She desires to proceed.  Findings: Urothelium of the bladder was normal, ureteral orifices normal in configuration and location.  Retrograde ureteropyelogram was performed revealing a normal distal and mid ureter with a filling defect in the proximal ureter.  Proximal to this, there was significant pyelocaliectasis.  Description of procedure: The patient was properly identified and marked in the holding area.  She had artery received 2 separate antibiotics prior to anesthesia, Levaquin and Rocephin.  She was taken to the operating room where general anesthetic was administered with the LMA.  She was placed in the dorsolithotomy position.  Genitalia and perineum were prepped and draped.  A proper timeout was performed.  A 21 French panendoscope was advanced into the bladder.  Bladder appeared normal with no urothelial abnormalities and normal ureteral orifices.  With the assistance of a sensor tip guidewire, a 6 [place] open-ended catheter was  advanced into the left ureteral orifice.  The guidewire was removed and a general retrograde ureteropyelogram was performed with Omnipaque.  This revealed entirely normal distal and mid ureter with proximal filling defect and proximal to that ureteral pyelocaliectasis.  Following retrograde, the sensor-tip guidewire was then advanced again into the open-ended catheter and guided by the stone in the proximal ureter with a curl seen in the upper pole calyces.  Once positioning of the guidewire was adequate, the open-ended catheter was removed and a 24 centimeter by 6 [place] contour double-J stent was deployed in the ureter over top of the guidewire, with excellent proximal and distal curl seen once a guidewire was removed.  The bladder was drained.  The scope was removed and the procedure terminated.  The patient tolerated the procedure well.

## 2017-04-09 NOTE — Transfer of Care (Addendum)
Immediate Anesthesia Transfer of Care Note  Patient: [name]  Procedure(s) Performed: CYSTOSCOPY WITH LEFT RETROGRADE PYELOGRAM/LEFT URETERAL STENT PLACEMENT (Left )  Patient Location: PACU  Anesthesia Type:General  Level of Consciousness: awake, alert  and oriented  Airway & Oxygen Therapy: Patient Spontanous Breathing  Post-op Assessment: Report given to RN  Post vital signs: Reviewed and stable  Last Vitals:  Vitals:   04/09/17 0930 04/09/17 0935  BP:  119/81  Pulse:    Resp: (!) (P) 24 (!) 29  Temp:    SpO2: 94% (!) 89%    Last Pain:  Vitals:   04/09/17 0900  TempSrc:   PainSc: 7       Patients Stated Pain Goal: 5 (16/94/50 3888)  Complications: No apparent anesthesia complications  Patient shivering and tachycardic in PACU.  Dr. Patsey Berthold aware.  Demerol and lopressor given.

## 2017-04-09 NOTE — ED Notes (Signed)
[place] (husband)

## 2017-04-09 NOTE — Anesthesia Procedure Notes (Signed)
Procedure Name: LMA Insertion Date/Time: 04/09/2017 10:10 AM Performed by: Tressie Stalker E Pre-anesthesia Checklist: Patient identified, Patient being monitored, Emergency Drugs available, Timeout performed and Suction available Patient Re-evaluated:Patient Re-evaluated prior to induction Oxygen Delivery Method: Circle System Utilized Preoxygenation: Pre-oxygenation with 100% oxygen Induction Type: IV induction Ventilation: Mask ventilation without difficulty LMA: LMA inserted LMA Size: 4.0 Number of attempts: 1 Placement Confirmation: positive ETCO2 and breath sounds checked- equal and bilateral

## 2017-04-09 NOTE — ED Provider Notes (Signed)
[facility] DEPT Provider Note   CSN: [id number] Arrival date & time: 04/08/17  2030     History   Chief Complaint Chief Complaint  Patient presents with  . Flank Pain    HPI MONEY MCKEITHAN is a 36 y.o. female.  Patient is a 36 year old female who presents to the emergency department with a complaint of left flank pain.  The patient states she has had flank pain over the last for 5 days. Over the last 3 days she's been having vomiting and back pain. She's been having chills, but has not measured temperature elevation. She states now she generally does not feel good. She cannot find a comfortable position to be in. Her last episode of vomiting was approximately 5:30 PM today. She has been able to keep some water and Coke down since that time. She's had increased urine frequency accompanying the flank pain. She presents now for assistance with this issue. It is of note that she had a problem with kidney stones proximally 5 years ago.      Past Medical History:  Diagnosis Date  . Migraine   . Renal disorder    kidney stone    Patient Active Problem List   Diagnosis Date Noted  . Overdose 05/02/2015  . Unspecified constipation 03/31/2014  . Migraines 03/09/2014    Past Surgical History:  Procedure Laterality Date  . CESAREAN SECTION    . TUBAL LIGATION      OB History    Gravida Para Term Preterm AB Living   3 3 3     3    SAB TAB Ectopic Multiple Live Births                   Home Medications    Prior to Admission medications   Medication Sig Start Date End Date Taking? Authorizing Provider  acetaminophen (TYLENOL) 500 MG tablet Take 1 tablet (500 mg total) by mouth every 6 (six) hours as needed for mild pain. 05/03/15  Yes Samuella Cota, MD  citalopram (CELEXA) 20 MG tablet Take 1 tablet (20 mg total) by mouth daily. 09/26/16  Yes Susy Frizzle, MD  diazepam (VALIUM) 2 MG tablet Take 1 tablet (2 mg total) by mouth every 6 (six) hours as needed  for anxiety. 07/13/15  Yes [place], Modena Nunnery, MD  HYDROcodone-acetaminophen (NORCO/VICODIN) 5-325 MG tablet Take 1 tablet by mouth every 6 (six) hours as needed for moderate pain. 10/03/15  Yes Orlena Sheldon, PA-C  metaxalone (SKELAXIN) 800 MG tablet Take 1 tablet (800 mg total) by mouth 4 (four) times daily. 10/03/15  Yes Orlena Sheldon, PA-C  Multiple Vitamin (MULTIVITAMIN WITH MINERALS) TABS tablet Take 1 tablet by mouth daily.   Yes [provider]  oxyCODONE-acetaminophen (PERCOCET/ROXICET) 5-325 MG tablet Take 1 tablet by mouth every 6 (six) hours as needed for severe pain.   Yes [provider]  propranolol ER (INDERAL LA) 60 MG 24 hr capsule Take 1 capsule (60 mg total) by mouth daily. For migraines 07/13/15  Yes [place], Modena Nunnery, MD    Family History Family History  Problem Relation Age of Onset  . Stroke Father   . Heart failure Father   . Diabetes Father     Social History Social History  Substance Use Topics  . Smoking status: Current Every Day Smoker    Packs/day: 0.50    Types: Cigarettes  . Smokeless tobacco: Never Used  . Alcohol use Yes     Comment:  occasional     Allergies   Morphine and related and Ibuprofen   Review of Systems Review of Systems  Constitutional: Negative for activity change.       All ROS Neg except as noted in HPI  HENT: Negative for nosebleeds.   Eyes: Negative for photophobia and discharge.  Respiratory: Negative for cough, shortness of breath and wheezing.   Cardiovascular: Negative for chest pain and palpitations.  Gastrointestinal: Positive for nausea and vomiting. Negative for abdominal pain and blood in stool.  Genitourinary: Positive for flank pain and frequency. Negative for dysuria and hematuria.  Musculoskeletal: Positive for back pain. Negative for arthralgias and neck pain.  Skin: Negative.   Neurological: Negative for dizziness, seizures and speech difficulty.  Psychiatric/Behavioral: Negative for confusion  and hallucinations.     Physical Exam Updated Vital Signs BP 121/67 (BP Location: Right Arm)   Pulse (!) 104   Temp 98.8 F (37.1 C)   Resp 16   Ht 5\' 5"  (1.651 m)   Wt 71.7 kg (158 lb)   LMP 03/25/2017 (Approximate)   SpO2 97%   BMI 26.29 kg/m   Physical Exam  Constitutional: She is oriented to person, place, and time. She appears well-developed and well-nourished.  Non-toxic appearance.  HENT:  Head: Normocephalic.  Right Ear: Tympanic membrane and external ear normal.  Left Ear: Tympanic membrane and external ear normal.  Eyes: Pupils are equal, round, and reactive to light. EOM and lids are normal.  Neck: Normal range of motion. Neck supple. Carotid bruit is not present.  Cardiovascular: Normal rate, regular rhythm, normal heart sounds, intact distal pulses and normal pulses.   Pulmonary/Chest: Breath sounds normal. No respiratory distress.  Abdominal: Soft. Bowel sounds are normal. There is tenderness in the suprapubic area. There is no guarding ( ).  Left CVA tenderness.  Musculoskeletal: Normal range of motion.  Lymphadenopathy:       Head (right side): No submandibular adenopathy present.       Head (left side): No submandibular adenopathy present.    She has no cervical adenopathy.  Neurological: She is alert and oriented to person, place, and time. She has normal strength. No cranial nerve deficit or sensory deficit.  Skin: Skin is warm and dry.  Psychiatric: She has a normal mood and affect. Her speech is normal.  Nursing note and vitals reviewed.    ED Treatments / Results  Labs (all labs ordered are listed, but only abnormal results are displayed) Labs Reviewed  CBC WITH DIFFERENTIAL/PLATELET - Abnormal; Notable for the following:       Result Value   WBC 15.5 (*)    Neutro Abs 11.4 (*)    Monocytes Absolute 1.3 (*)    All other components within normal limits  BASIC METABOLIC PANEL - Abnormal; Notable for the following:    Sodium 134 (*)     Chloride 98 (*)    All other components within normal limits  URINALYSIS, ROUTINE W REFLEX MICROSCOPIC - Abnormal; Notable for the following:    APPearance CLOUDY (*)    Hgb urine dipstick MODERATE (*)    Protein, ur 30 (*)    Nitrite POSITIVE (*)    Leukocytes, UA LARGE (*)    Bacteria, UA FEW (*)    Squamous Epithelial / LPF 6-30 (*)    All other components within normal limits  URINE CULTURE  PREGNANCY, URINE  LACTIC ACID, PLASMA    EKG  EKG Interpretation None  Radiology Ct Renal Stone Study  Result Date: 04/08/2017 CLINICAL DATA:  Left flank pain and vomiting for 3 days. Kidney stone 5 years ago. EXAM: CT ABDOMEN AND PELVIS WITHOUT CONTRAST TECHNIQUE: Multidetector CT imaging of the abdomen and pelvis was performed following the standard protocol without IV contrast. COMPARISON:  06/12/2014 FINDINGS: Lower chest: The lung bases are clear. Hepatobiliary: No focal liver abnormality is seen. No gallstones, gallbladder wall thickening, or biliary dilatation. Pancreas: Unremarkable. No pancreatic ductal dilatation or surrounding inflammatory changes. Spleen: Normal in size without focal abnormality. Adrenals/Urinary Tract: No adrenal gland nodules. 4 mm stone in the proximal left ureter at the level of L3. Proximal left hydronephrosis and hydroureter. Stranding around the left kidney and ureter. Additional punctate sized intrarenal stones seen bilaterally. No hydronephrosis or hydroureter on the right. Bladder is unremarkable. Stomach/Bowel: Stomach is within normal limits. Appendix appears normal. No evidence of bowel wall thickening, distention, or inflammatory changes. Vascular/Lymphatic: No significant vascular findings are present. No enlarged abdominal or pelvic lymph nodes. Reproductive: Uterus and bilateral adnexa are unremarkable. Other: No abdominal wall hernia or abnormality. No abdominopelvic ascites. Musculoskeletal: No acute or significant osseous findings. IMPRESSION: 4  mm stone in the proximal left ureter with moderate proximal obstruction. Additional bilateral nonobstructing intrarenal stones. Electronically Signed   By: Lucienne Capers M.D.   On: 04/08/2017 23:45    Procedures Procedures (including critical care time)  Medications Ordered in ED Medications  levofloxacin (LEVAQUIN) IVPB 500 mg (500 mg Intravenous New Bag/Given 04/09/17 0042)  HYDROmorphone (DILAUDID) injection 1 mg (1 mg Intravenous Given 04/09/17 0038)  promethazine (PHENERGAN) injection 12.5 mg (12.5 mg Intravenous Given 04/09/17 0038)  ketorolac (TORADOL) 30 MG/ML injection 30 mg (30 mg Intravenous Given 04/09/17 0039)     Initial Impression / Assessment and Plan / ED Course  I have reviewed the triage vital signs and the nursing notes.  Pertinent labs & imaging results that were available during my care of the patient were reviewed by me and considered in my medical decision making (see chart for details).     Case discussed with Dr Stark Jock.  Final Clinical Impressions(s) / ED Diagnoses MDM Patient has a slight tachycardia 104, otherwise vital signs within normal limits.  Patient states she's been able to keep a small mild of Coke and since being in the emergency department.  Complete blood count shows the white blood cells to be elevated at 15,500.  Basic metabolic panel shows the sodium to be slightly low at 135 and a chloride low at 98, otherwise essentially within normal limits.anion gap is normal at 11. Urinalysis shows a cloudy yellow specimen with a specific gravity 1.016 leukocytes large nitrates are also present. There are too many to count white blood cells, and 6-30 red blood cells. There are also clumps of white blood cells noted in the urine. Urine pregnancy is negative.  A CT scan was obtained and reveals a 4 mm stone in the proximal on left ureter with moderate proximal obstruction.  Patient given pain medication and started on IV antibiotics.  Case discussed with  Dr.Dahlstedt -urology. He will see the patient tomorrow and place a stent.  Case discussed with Dr. Maudie Mercury with [facility]. They will admit the patient.   Final diagnoses:  Pyelonephritis  Ureteral colic  Kidney stone    New Prescriptions New Prescriptions   No medications on file     Lily Kocher, Hershal Coria 04/09/17 0109    Veryl Speak, MD 04/09/17 [phone]

## 2017-04-09 NOTE — H&P (Signed)
TRH H&P   Patient Demographics:    Sally [name], is a 36 y.o. female  MRN: [id number]   DOB - 06/04/1981  Admit Date - 04/08/2017  Outpatient Primary MD for the patient is Dennard Schaumann Cammie Mcgee, MD  Referring MD/NP/PA:  Lily Kocher  Outpatient Specialists:   Patient coming from: home  Chief Complaint  Patient presents with  . 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  Pt notes slight nausea, subjective fever.  Pt denies cp, palp, sob, abd pain, diarrhea, brbpr, black stool, pt presented to ED due to worsening left flank pain.   In ED. ua wbc tntc.  WBC 15.5,  Hgb 14.1,  Plt 275.  CT scan =>  IMPRESSION: 4 mm stone in the proximal left ureter with moderate proximal obstruction. Additional bilateral nonobstructing intrarenal stones.  ED contacted urology who recommended admission, and will consult in AM, appreciate input.      Review of systems:    In addition to the HPI above,   No Headache, No changes with Vision or hearing, No problems swallowing food or Liquids, No Chest pain, Cough or Shortness of Breath, No Abdominal pain,  Bowel movements are regular, No Blood in stool No dysuria, No new skin rashes or bruises, No new joints pains-aches,  No new weakness, tingling, numbness in any extremity, No recent weight gain or loss, No polyuria, polydypsia or polyphagia, No significant Mental Stressors.  A full 10 point Review of Systems was done, except as stated above, all other Review of Systems were negative.   With Past History of the following :    Past Medical History:  Diagnosis Date  . Migraine   . Renal disorder    kidney stone      Past Surgical History:  Procedure Laterality Date  . CESAREAN SECTION    . TUBAL  LIGATION        Social History:     Social History  Substance Use Topics  . Smoking status: Current Every Day Smoker    Packs/day: 0.50    Types: Cigarettes  . Smokeless tobacco: Never Used  . Alcohol use Yes     Comment: occasional     Lives - at home  Mobility - walks without assistance   Family History :     Family History  Problem Relation Age of Onset  . Stroke Father   . Heart failure Father   . Diabetes Father       Home Medications:   Prior to Admission medications   Medication Sig Start Date End Date Taking? Authorizing Provider  acetaminophen (TYLENOL) 500 MG tablet Take 1 tablet (500 mg total) by mouth every 6 (six) hours as needed  for mild pain. 05/03/15  Yes Samuella Cota, MD  citalopram (CELEXA) 20 MG tablet Take 1 tablet (20 mg total) by mouth daily. 09/26/16  Yes Susy Frizzle, MD  diazepam (VALIUM) 2 MG tablet Take 1 tablet (2 mg total) by mouth every 6 (six) hours as needed for anxiety. 07/13/15  Yes [place], Modena Nunnery, MD  HYDROcodone-acetaminophen (NORCO/VICODIN) 5-325 MG tablet Take 1 tablet by mouth every 6 (six) hours as needed for moderate pain. 10/03/15  Yes Orlena Sheldon, PA-C  metaxalone (SKELAXIN) 800 MG tablet Take 1 tablet (800 mg total) by mouth 4 (four) times daily. 10/03/15  Yes Orlena Sheldon, PA-C  Multiple Vitamin (MULTIVITAMIN WITH MINERALS) TABS tablet Take 1 tablet by mouth daily.   Yes [provider]  oxyCODONE-acetaminophen (PERCOCET/ROXICET) 5-325 MG tablet Take 1 tablet by mouth every 6 (six) hours as needed for severe pain.   Yes [provider]  propranolol ER (INDERAL LA) 60 MG 24 hr capsule Take 1 capsule (60 mg total) by mouth daily. For migraines 07/13/15  Yes [place], Modena Nunnery, MD     Allergies:     Allergies  Allergen Reactions  . Morphine And Related Itching  . Ibuprofen Itching and Rash     Physical Exam:   Vitals  Blood pressure 114/61, pulse 80, temperature 98.4 F (36.9 C), resp.  rate 20, height 5\' 5"  (1.651 m), weight 71.7 kg (158 lb), last menstrual period 03/25/2017, SpO2 97 %.   1. General  lying in bed in NAD,    2. Normal affect and insight, Not Suicidal or Homicidal, Awake Alert, Oriented X 3.  3. No F.N deficits, ALL C.Nerves Intact, Strength 5/5 all 4 extremities, Sensation intact all 4 extremities, Plantars down going.  4. Ears and Eyes appear Normal, Conjunctivae clear, PERRLA. Moist Oral Mucosa.  5. Supple Neck, No JVD, No cervical lymphadenopathy appriciated, No Carotid Bruits.  6. Symmetrical Chest wall movement, Good air movement bilaterally, CTAB.  7. RRR, No Gallops, Rubs or Murmurs, No Parasternal Heave.  8. Positive Bowel Sounds, Abdomen Soft, No tenderness, No organomegaly appriciated,No rebound -guarding or rigidity.  9.  No Cyanosis, Normal Skin Turgor, No Skin Rash or Bruise.  10. Good muscle tone,  joints appear normal , no effusions, Normal ROM.  11. No Palpable Lymph Nodes in Neck or Axillae    Data Review:    CBC  Recent Labs Lab 04/08/17 2212  WBC 15.5*  HGB 14.1  HCT 42.3  PLT 275  MCV 92.4  MCH 30.8  MCHC 33.3  RDW 12.9  LYMPHSABS 2.7  MONOABS 1.3*  EOSABS 0.1  BASOSABS 0.0   ------------------------------------------------------------------------------------------------------------------  Chemistries   Recent Labs Lab 04/08/17 2212  NA 134*  K 3.5  CL 98*  CO2 25  GLUCOSE 97  BUN 17  CREATININE 0.84  CALCIUM 9.3   ------------------------------------------------------------------------------------------------------------------ estimated creatinine clearance is 92.8 mL/min (by C-G formula based on SCr of 0.84 mg/dL). ------------------------------------------------------------------------------------------------------------------ No results for input(s): TSH, T4TOTAL, T3FREE, THYROIDAB in the last 72 hours.  Invalid input(s): FREET3  Coagulation profile No results for input(s): INR, PROTIME in  the last 168 hours. ------------------------------------------------------------------------------------------------------------------- No results for input(s): DDIMER in the last 72 hours. -------------------------------------------------------------------------------------------------------------------  Cardiac Enzymes No results for input(s): CKMB, TROPONINI, MYOGLOBIN in the last 168 hours.  Invalid input(s): CK ------------------------------------------------------------------------------------------------------------------ No results found for: BNP   ---------------------------------------------------------------------------------------------------------------  Urinalysis    Component Value Date/Time   COLORURINE YELLOW 04/08/2017 2116   APPEARANCEUR CLOUDY (A) 04/08/2017  2116   LABSPEC 1.016 04/08/2017 2116   PHURINE 6.0 04/08/2017 2116   GLUCOSEU NEGATIVE 04/08/2017 2116   HGBUR MODERATE (A) 04/08/2017 2116   [place] NEGATIVE 04/08/2017 2116   [place] NEGATIVE 04/08/2017 2116   PROTEINUR 30 (A) 04/08/2017 2116   UROBILINOGEN 0.2 05/02/2015 0005   NITRITE POSITIVE (A) 04/08/2017 2116   LEUKOCYTESUR LARGE (A) 04/08/2017 2116    ----------------------------------------------------------------------------------------------------------------   Imaging Results:    Ct Renal Stone Study  Result Date: 04/08/2017 CLINICAL DATA:  Left flank pain and vomiting for 3 days. Kidney stone 5 years ago. EXAM: CT ABDOMEN AND PELVIS WITHOUT CONTRAST TECHNIQUE: Multidetector CT imaging of the abdomen and pelvis was performed following the standard protocol without IV contrast. COMPARISON:  06/12/2014 FINDINGS: Lower chest: The lung bases are clear. Hepatobiliary: No focal liver abnormality is seen. No gallstones, gallbladder wall thickening, or biliary dilatation. Pancreas: Unremarkable. No pancreatic ductal dilatation or surrounding inflammatory changes. Spleen: Normal in size without  focal abnormality. Adrenals/Urinary Tract: No adrenal gland nodules. 4 mm stone in the proximal left ureter at the level of L3. Proximal left hydronephrosis and hydroureter. Stranding around the left kidney and ureter. Additional punctate sized intrarenal stones seen bilaterally. No hydronephrosis or hydroureter on the right. Bladder is unremarkable. Stomach/Bowel: Stomach is within normal limits. Appendix appears normal. No evidence of bowel wall thickening, distention, or inflammatory changes. Vascular/Lymphatic: No significant vascular findings are present. No enlarged abdominal or pelvic lymph nodes. Reproductive: Uterus and bilateral adnexa are unremarkable. Other: No abdominal wall hernia or abnormality. No abdominopelvic ascites. Musculoskeletal: No acute or significant osseous findings. IMPRESSION: 4 mm stone in the proximal left ureter with moderate proximal obstruction. Additional bilateral nonobstructing intrarenal stones. Electronically Signed   By: Lucienne Capers M.D.   On: 04/08/2017 23:45      Assessment & Plan:    Principal Problem:   Nephrolithiasis Active Problems:   Fever, unspecified   UTI (urinary tract infection)    Fever Blood culture x2 Urine culture pending Rocephin Ns iv  Nephrolithiasis Urology consulted by ED Appreciate input  Uti rocephin1gm iv qday Await urine culture.      DVT Prophylaxis  Lovenox - SCDs  AM Labs Ordered, also please review Full Orders  Family Communication: Admission, patients condition and plan of care including tests being ordered have been discussed with the patient  who indicate understanding and agree with the plan and Code Status.  Code Status FULL CODE  Likely DC to  home  Condition GUARDED    Consults called: urology by ED  Admission status: inpatient  Time spent in minutes : 45   Jani Gravel M.D on 04/09/2017 at 2:16 AM  Between 7am to 7pm - Pager - [phone]. After 7pm go to [email or web address] - password  [facility]  [facility] - Office  [phone]

## 2017-04-09 NOTE — Anesthesia Postprocedure Evaluation (Signed)
Anesthesia Post Note  Patient: [name]  Procedure(s) Performed: CYSTOSCOPY WITH LEFT RETROGRADE PYELOGRAM/LEFT URETERAL STENT PLACEMENT (Left )  Patient location during evaluation: PACU Anesthesia Type: General Level of consciousness: awake and alert and oriented Pain management: pain level controlled Respiratory status: spontaneous breathing Cardiovascular status: stable Postop Assessment: no apparent nausea or vomiting Anesthetic complications: no     Last Vitals:  Vitals:   04/09/17 1045 04/09/17 1050  BP: (!) 145/92 (!) 150/90  Pulse: (!) 145 (!) 122  Resp: (!) 29 (!) 33  Temp:  (!) 39.3 C  SpO2: 92% 100%    Last Pain:  Vitals:   04/09/17 1045  TempSrc:   PainSc: 0-No pain                 Sally [name]

## 2017-04-09 NOTE — Progress Notes (Addendum)
PROGRESS NOTE                                                                                                                                                                                                             Patient Demographics:    Sally [name], is a 36 y.o. female, DOB - [birth year], GGE:[id number]  Admit date - 04/08/2017   Admitting Physician Jani Gravel, MD  Outpatient Primary MD for the patient is [place], Cammie Mcgee, MD  LOS - 0  Outpatient Specialists:none  Chief Complaint  Patient presents with  . Flank Pain       Brief Narrative   36 year old healthy female admitted with 10 day history of left flank pain associated with nausea and subjective fever presented with UTI/acute pyelonephritis with obstructive left proximal ureteral stone.   Subjective:   Patient seen after returning from the procedure. Complains of left flank pain. Was febrile this morning and tachycardic to 120s.  Assessment  & Plan :   Principal problem Sepsis secondary to UTI/acute pyelonephritis  Acute pyelonephritis with obstructing left proximal ureteral stone. Continue telemetry monitoring. IV fluids, empiric IV Rocephin. Follow urine and blood cultures. Supportive care with Tylenol for fever, antiemetics and when necessary IV Dilaudid for pain. Urology consult appreciated and patient underwent cystoscopy with left retrograde ureteropyelogram and placement of double-J stent.   Hypokalemia Replenished.    Code Status : Full code  Family Communication  : Husband at bedside  Disposition Plan  : Home once improved and final cultures obtained, possibly in the next 48 hours  Barriers For Discharge : Active symptoms  Consults  :  Urology  Procedures  :  Cystoscopy with retrograde pyelogram and stenting  DVT Prophylaxis  :  Lovenox -   Lab Results  Component Value Date   PLT 233 04/09/2017    Antibiotics  :    Anti-infectives    Start     Dose/Rate Route Frequency Ordered Stop   04/09/17 0215  cefTRIAXone (ROCEPHIN) 1 g in dextrose 5 % 50 mL IVPB     1 g 100 mL/hr over 30 Minutes Intravenous Every 24 hours 04/09/17 0201     04/09/17 0030  levofloxacin (LEVAQUIN) IVPB 500 mg     500 mg 100 mL/hr over 60 Minutes Intravenous  Once 04/09/17 0029 04/09/17 0142  Objective:   Vitals:   04/09/17 1115 04/09/17 1130 04/09/17 1145 04/09/17 1207  BP: 125/75 136/78 (!) 148/72 124/78  Pulse: (!) 118 (!) 133 (!) 133 (!) 126  Resp: (!) 37 (!) 27 12 16   Temp: (!) 102.4 F (39.1 C)   99.5 F (37.5 C)  TempSrc:      SpO2: 100% 98% 97% 94%  Weight:      Height:        Wt Readings from Last 3 Encounters:  04/09/17 73.8 kg (162 lb 11.2 oz)  09/12/15 71.7 kg (158 lb)  07/13/15 73 kg (161 lb)     Intake/Output Summary (Last 24 hours) at 04/09/17 1225 Last data filed at 04/09/17 1038  Gross per 24 hour  Intake             1000 ml  Output                0 ml  Net             1000 ml     Physical Exam  Gen: Appears fatigued and in pain HEENT: No pallor, dry oral mucosa, supple neck Chest: Clear bilaterally, no added sounds CVS: S1 and S2 tachycardic, no murmurs rub or gallop GI: Soft, nondistended, bowel sounds present, left flank tenderness Musculoskeletal: Warm, no edema    Data Review:    CBC  Recent Labs Lab 04/08/17 2212 04/09/17 0557  WBC 15.5* 11.7*  HGB 14.1 12.6  HCT 42.3 37.3  PLT 275 233  MCV 92.4 93.7  MCH 30.8 31.7  MCHC 33.3 33.8  RDW 12.9 13.1  LYMPHSABS 2.7  --   MONOABS 1.3*  --   EOSABS 0.1  --   BASOSABS 0.0  --     Chemistries   Recent Labs Lab 04/08/17 2212 04/09/17 0557  NA 134* 136  K 3.5 3.2*  CL 98* 100*  CO2 25 27  GLUCOSE 97 109*  BUN 17 16  CREATININE 0.84 0.87  CALCIUM 9.3 8.8*  AST  --  19  ALT  --  14  ALKPHOS  --  65  BILITOT  --  0.8    ------------------------------------------------------------------------------------------------------------------ No results for input(s): CHOL, HDL, LDLCALC, TRIG, CHOLHDL, LDLDIRECT in the last 72 hours.  No results found for: HGBA1C ------------------------------------------------------------------------------------------------------------------ No results for input(s): TSH, T4TOTAL, T3FREE, THYROIDAB in the last 72 hours.  Invalid input(s): FREET3 ------------------------------------------------------------------------------------------------------------------ No results for input(s): VITAMINB12, FOLATE, FERRITIN, TIBC, IRON, RETICCTPCT in the last 72 hours.  Coagulation profile No results for input(s): INR, PROTIME in the last 168 hours.  No results for input(s): DDIMER in the last 72 hours.  Cardiac Enzymes No results for input(s): CKMB, TROPONINI, MYOGLOBIN in the last 168 hours.  Invalid input(s): CK ------------------------------------------------------------------------------------------------------------------ No results found for: BNP  Inpatient Medications  Scheduled Meds: . citalopram  20 mg Oral Daily  . enoxaparin (LOVENOX) injection  40 mg Subcutaneous Q24H  . metaxalone  800 mg Oral QID  . propranolol ER  60 mg Oral Daily   Continuous Infusions: . sodium chloride 100 mL/hr at 04/09/17 0233  . cefTRIAXone (ROCEPHIN)  IV Stopped (04/09/17 0303)   PRN Meds:.acetaminophen **OR** acetaminophen, diazepam, HYDROmorphone (DILAUDID) injection, ondansetron (ZOFRAN) IV, oxybutynin, oxyCODONE-acetaminophen, promethazine  Micro Results Recent Results (from the past 240 hour(s))  Culture, blood (routine x 2)     Status: None (Preliminary result)   Collection Time: 04/09/17  1:13 AM  Result Value Ref Range Status   Specimen Description BLOOD  RIGHT HAND  Final   Special Requests AEROBIC BOTTLE ONLY Blood Culture adequate volume  Final   Culture NO GROWTH < 12 HOURS   Final   Report Status PENDING  Incomplete  Culture, blood (routine x 2)     Status: None (Preliminary result)   Collection Time: 04/09/17  1:18 AM  Result Value Ref Range Status   Specimen Description BLOOD LEFT HAND  Final   Special Requests AEROBIC BOTTLE ONLY Blood Culture adequate volume  Final   Culture NO GROWTH < 12 HOURS  Final   Report Status PENDING  Incomplete    Radiology Reports Dg C-arm 1-60 Min-no Report  Result Date: 04/09/2017 Fluoroscopy was utilized by the requesting physician.  No radiographic interpretation.   Ct Renal Stone Study  Result Date: 04/08/2017 CLINICAL DATA:  Left flank pain and vomiting for 3 days. Kidney stone 5 years ago. EXAM: CT ABDOMEN AND PELVIS WITHOUT CONTRAST TECHNIQUE: Multidetector CT imaging of the abdomen and pelvis was performed following the standard protocol without IV contrast. COMPARISON:  06/12/2014 FINDINGS: Lower chest: The lung bases are clear. Hepatobiliary: No focal liver abnormality is seen. No gallstones, gallbladder wall thickening, or biliary dilatation. Pancreas: Unremarkable. No pancreatic ductal dilatation or surrounding inflammatory changes. Spleen: Normal in size without focal abnormality. Adrenals/Urinary Tract: No adrenal gland nodules. 4 mm stone in the proximal left ureter at the level of L3. Proximal left hydronephrosis and hydroureter. Stranding around the left kidney and ureter. Additional punctate sized intrarenal stones seen bilaterally. No hydronephrosis or hydroureter on the right. Bladder is unremarkable. Stomach/Bowel: Stomach is within normal limits. Appendix appears normal. No evidence of bowel wall thickening, distention, or inflammatory changes. Vascular/Lymphatic: No significant vascular findings are present. No enlarged abdominal or pelvic lymph nodes. Reproductive: Uterus and bilateral adnexa are unremarkable. Other: No abdominal wall hernia or abnormality. No abdominopelvic ascites. Musculoskeletal: No acute or  significant osseous findings. IMPRESSION: 4 mm stone in the proximal left ureter with moderate proximal obstruction. Additional bilateral nonobstructing intrarenal stones. Electronically Signed   By: Lucienne Capers M.D.   On: 04/08/2017 23:45    Time Spent in minutes 35   Louellen Molder M.D on 04/09/2017 at 12:25 PM  Between 7am to 7pm - Pager - [phone]  After 7pm go to [email or web address] - password [facility]  [facility] -  Office  [phone]

## 2017-04-09 NOTE — Consult Note (Signed)
Urology Consult   Physician requesting consult: Jani Gravel, MD  Reason for consult: Kidney stone, UTI  History of Present Illness: Sally [name] is a 36 y.o. female with prior h/o urolithissis who was admitted last night for a symptomatic left proximal left ureteral stone associated with a UTI. She has had intermittent left flank/back pain for ~10 days. CT revealed a 4 mm in diameter left proximal ureteral stone w/ hydro (additional lt and rt renal stones) and urine was infected. She has no signe of sepsis. She was admitted for iv abx mgmt, GU consult requested for stone mgmt.  She denies a history of voiding or storage urinary symptoms, hematuria, UTIs, STDs, urolithiasis, GU malignancy/trauma/surgery.  Past Medical History:  Diagnosis Date  . Migraine   . Renal disorder    kidney stone    Past Surgical History:  Procedure Laterality Date  . CESAREAN SECTION    . TUBAL LIGATION       Current Hospital Medications: Scheduled Meds: . citalopram  20 mg Oral Daily  . enoxaparin (LOVENOX) injection  40 mg Subcutaneous Q24H  . metaxalone  800 mg Oral QID  . propranolol ER  60 mg Oral Daily   Continuous Infusions: . sodium chloride 100 mL/hr at 04/09/17 0233  . cefTRIAXone (ROCEPHIN)  IV Stopped (04/09/17 0303)   PRN Meds:.acetaminophen **OR** acetaminophen, diazepam, HYDROmorphone (DILAUDID) injection, ondansetron (ZOFRAN) IV, oxyCODONE-acetaminophen, promethazine  Allergies:  Allergies  Allergen Reactions  . Morphine And Related Itching  . Ibuprofen Itching and Rash    Family History  Problem Relation Age of Onset  . Stroke Father   . Heart failure Father   . Diabetes Father     Social History:  reports that she has been smoking Cigarettes.  She has been smoking about 0.50 packs per day. She has never used smokeless tobacco. She reports that she drinks alcohol. She reports that she does not use drugs.  ROS: A complete review of systems was performed.  All systems  are negative except for pertinent findings as noted.  Physical Exam:  Vital signs in last 24 hours: Temp:  [98.1 F (36.7 C)-98.8 F (37.1 C)] 98.4 F (36.9 C) (10/04 0417) Pulse Rate:  [64-104] 64 (10/04 0417) Resp:  [16-20] 18 (10/04 0417) BP: (91-121)/(53-67) 91/53 (10/04 0417) SpO2:  [96 %-97 %] 97 % (10/04 0417) Weight:  [71.7 kg (158 lb)-73.8 kg (162 lb 11.2 oz)] 73.8 kg (162 lb 11.2 oz) (10/04 0417) General:  Alert and oriented, No acute distress HEENT: Normocephalic, atraumatic Neck: No JVD or lymphadenopathy Cardiovascular: Regular rate and rhythm Lungs: Clear bilaterally Abdomen: Soft, nontender, nondistended, no abdominal masses Back: No CVA tenderness Extremities: No edema Neurologic: Grossly intact  Laboratory Data:   Recent Labs  04/08/17 2212  WBC 15.5*  HGB 14.1  HCT 42.3  PLT 275     Recent Labs  04/08/17 2212  NA 134*  K 3.5  CL 98*  GLUCOSE 97  BUN 17  CALCIUM 9.3  CREATININE 0.84     Results for orders placed or performed during the hospital encounter of 04/08/17 (from the past 24 hour(s))  Urinalysis, Routine w reflex microscopic     Status: Abnormal   Collection Time: 04/08/17  9:16 PM  Result Value Ref Range   Color, Urine YELLOW YELLOW   APPearance CLOUDY (A) CLEAR   Specific Gravity, Urine 1.016 1.005 - 1.030   pH 6.0 5.0 - 8.0   Glucose, UA NEGATIVE NEGATIVE mg/dL   Hgb urine  dipstick MODERATE (A) NEGATIVE   Bilirubin Urine NEGATIVE NEGATIVE   Ketones, ur NEGATIVE NEGATIVE mg/dL   Protein, ur 30 (A) NEGATIVE mg/dL   Nitrite POSITIVE (A) NEGATIVE   Leukocytes, UA LARGE (A) NEGATIVE   RBC / HPF 6-30 0 - 5 RBC/hpf   WBC, UA TOO NUMEROUS TO COUNT 0 - 5 WBC/hpf   Bacteria, UA FEW (A) NONE SEEN   Squamous Epithelial / LPF 6-30 (A) NONE SEEN   WBC Clumps PRESENT    Mucus PRESENT   Pregnancy, urine     Status: None   Collection Time: 04/08/17  9:16 PM  Result Value Ref Range   Preg Test, Ur NEGATIVE NEGATIVE  CBC with  Differential     Status: Abnormal   Collection Time: 04/08/17 10:12 PM  Result Value Ref Range   WBC 15.5 (H) 4.0 - 10.5 K/uL   RBC 4.58 3.87 - 5.11 MIL/uL   Hemoglobin 14.1 12.0 - 15.0 g/dL   HCT 42.3 36.0 - 46.0 %   MCV 92.4 78.0 - 100.0 fL   MCH 30.8 26.0 - 34.0 pg   MCHC 33.3 30.0 - 36.0 g/dL   RDW 12.9 11.5 - 15.5 %   Platelets 275 150 - 400 K/uL   Neutrophils Relative % 74 %   Neutro Abs 11.4 (H) 1.7 - 7.7 K/uL   Lymphocytes Relative 18 %   Lymphs Abs 2.7 0.7 - 4.0 K/uL   Monocytes Relative 8 %   Monocytes Absolute 1.3 (H) 0.1 - 1.0 K/uL   Eosinophils Relative 1 %   Eosinophils Absolute 0.1 0.0 - 0.7 K/uL   Basophils Relative 0 %   Basophils Absolute 0.0 0.0 - 0.1 K/uL  Basic metabolic panel     Status: Abnormal   Collection Time: 04/08/17 10:12 PM  Result Value Ref Range   Sodium 134 (L) 135 - 145 mmol/L   Potassium 3.5 3.5 - 5.1 mmol/L   Chloride 98 (L) 101 - 111 mmol/L   CO2 25 22 - 32 mmol/L   Glucose, Bld 97 65 - 99 mg/dL   BUN 17 6 - 20 mg/dL   Creatinine, Ser 0.84 0.44 - 1.00 mg/dL   Calcium 9.3 8.9 - 10.3 mg/dL   GFR calc non Af Amer >60 >60 mL/min   GFR calc Af Amer >60 >60 mL/min   Anion gap 11 5 - 15  Lactic acid, plasma     Status: None   Collection Time: 04/08/17 10:12 PM  Result Value Ref Range   Lactic Acid, Venous 1.7 0.5 - 1.9 mmol/L  Culture, blood (routine x 2)     Status: None (Preliminary result)   Collection Time: 04/09/17  1:13 AM  Result Value Ref Range   Specimen Description BLOOD RIGHT HAND    Special Requests AEROBIC BOTTLE ONLY Blood Culture adequate volume    Culture PENDING    Report Status PENDING   Culture, blood (routine x 2)     Status: None (Preliminary result)   Collection Time: 04/09/17  1:18 AM  Result Value Ref Range   Specimen Description BLOOD LEFT HAND    Special Requests AEROBIC BOTTLE ONLY Blood Culture adequate volume    Culture PENDING    Report Status PENDING    Recent Results (from the past 240 hour(s))   Culture, blood (routine x 2)     Status: None (Preliminary result)   Collection Time: 04/09/17  1:13 AM  Result Value Ref Range Status   Specimen Description BLOOD  RIGHT HAND  Final   Special Requests AEROBIC BOTTLE ONLY Blood Culture adequate volume  Final   Culture PENDING  Incomplete   Report Status PENDING  Incomplete  Culture, blood (routine x 2)     Status: None (Preliminary result)   Collection Time: 04/09/17  1:18 AM  Result Value Ref Range Status   Specimen Description BLOOD LEFT HAND  Final   Special Requests AEROBIC BOTTLE ONLY Blood Culture adequate volume  Final   Culture PENDING  Incomplete   Report Status PENDING  Incomplete    Renal Function:  Recent Labs  04/08/17 2212  CREATININE 0.84   Estimated Creatinine Clearance: 94 mL/min (by C-G formula based on SCr of 0.84 mg/dL).  Radiologic Imaging: Ct Renal Stone Study  Result Date: 04/08/2017 CLINICAL DATA:  Left flank pain and vomiting for 3 days. Kidney stone 5 years ago. EXAM: CT ABDOMEN AND PELVIS WITHOUT CONTRAST TECHNIQUE: Multidetector CT imaging of the abdomen and pelvis was performed following the standard protocol without IV contrast. COMPARISON:  06/12/2014 FINDINGS: Lower chest: The lung bases are clear. Hepatobiliary: No focal liver abnormality is seen. No gallstones, gallbladder wall thickening, or biliary dilatation. Pancreas: Unremarkable. No pancreatic ductal dilatation or surrounding inflammatory changes. Spleen: Normal in size without focal abnormality. Adrenals/Urinary Tract: No adrenal gland nodules. 4 mm stone in the proximal left ureter at the level of L3. Proximal left hydronephrosis and hydroureter. Stranding around the left kidney and ureter. Additional punctate sized intrarenal stones seen bilaterally. No hydronephrosis or hydroureter on the right. Bladder is unremarkable. Stomach/Bowel: Stomach is within normal limits. Appendix appears normal. No evidence of bowel wall thickening, distention, or  inflammatory changes. Vascular/Lymphatic: No significant vascular findings are present. No enlarged abdominal or pelvic lymph nodes. Reproductive: Uterus and bilateral adnexa are unremarkable. Other: No abdominal wall hernia or abnormality. No abdominopelvic ascites. Musculoskeletal: No acute or significant osseous findings. IMPRESSION: 4 mm stone in the proximal left ureter with moderate proximal obstruction. Additional bilateral nonobstructing intrarenal stones. Electronically Signed   By: Lucienne Capers M.D.   On: 04/08/2017 23:45    I independently reviewed the above imaging studies.  Impression/Assessment:  Left proximal ureteral stone w/ UTI  Plan:  Cystoscopy, left RGP, left J2 stent

## 2017-04-10 ENCOUNTER — Encounter (HOSPITAL_COMMUNITY): Payer: Self-pay | Admitting: Urology

## 2017-04-10 DIAGNOSIS — A419 Sepsis, unspecified organism: Secondary | ICD-10-CM | POA: Diagnosis present

## 2017-04-10 DIAGNOSIS — N390 Urinary tract infection, site not specified: Secondary | ICD-10-CM

## 2017-04-10 DIAGNOSIS — N132 Hydronephrosis with renal and ureteral calculous obstruction: Secondary | ICD-10-CM

## 2017-04-10 DIAGNOSIS — N10 Acute tubulo-interstitial nephritis: Secondary | ICD-10-CM | POA: Diagnosis present

## 2017-04-10 LAB — BASIC METABOLIC PANEL
ANION GAP: 9 (ref 5–15)
BUN: 17 mg/dL (ref 6–20)
CALCIUM: 7.5 mg/dL — AB (ref 8.9–10.3)
CO2: 23 mmol/L (ref 22–32)
Chloride: 105 mmol/L (ref 101–111)
Creatinine, Ser: 0.99 mg/dL (ref 0.44–1.00)
GFR calc non Af Amer: >60 mL/min (ref >60)
Glucose, Bld: 102 mg/dL — ABNORMAL HIGH (ref 65–99)
POTASSIUM: 4 mmol/L (ref 3.5–5.1)
Sodium: 137 mmol/L (ref 135–145)

## 2017-04-10 LAB — CBC
HEMATOCRIT: 32.3 % — AB (ref 36.0–46.0)
HEMOGLOBIN: 11 g/dL — AB (ref 12.0–15.0)
MCH: 31.7 pg (ref 26.0–34.0)
MCHC: 34.1 g/dL (ref 30.0–36.0)
MCV: 93.1 fL (ref 78.0–100.0)
Platelets: 157 10*3/uL (ref 150–400)
RBC: 3.47 MIL/uL — AB (ref 3.87–5.11)
RDW: 13.6 % (ref 11.5–15.5)
WBC: 24 10*3/uL — AB (ref 4.0–10.5)

## 2017-04-10 LAB — HIV ANTIBODY (ROUTINE TESTING W REFLEX): HIV Screen 4th Generation wRfx: NONREACTIVE

## 2017-04-10 MED ORDER — TAMSULOSIN HCL 0.4 MG PO CAPS
0.4000 mg | ORAL_CAPSULE | Freq: Every day | ORAL | Status: DC
  Administered 2017-04-10 – 2017-04-12 (×3): 0.4 mg via ORAL
  Filled 2017-04-10 (×3): qty 1

## 2017-04-10 MED ORDER — NICOTINE 21 MG/24HR TD PT24
21.0000 mg | MEDICATED_PATCH | Freq: Every day | TRANSDERMAL | Status: DC
  Administered 2017-04-10: 21 mg via TRANSDERMAL
  Filled 2017-04-10 (×3): qty 1

## 2017-04-10 MED ORDER — SODIUM CHLORIDE 0.9 % IV SOLN
INTRAVENOUS | Status: AC
  Administered 2017-04-10: 13:00:00 via INTRAVENOUS

## 2017-04-10 NOTE — Progress Notes (Signed)
Patient expressing desire to smoke - explained to patient that we are a no smoking campus and instructed her to stay on this floor while ambulating.  Explained that I, nor the telemetry, would be able to monitor her off this floor.  Patient verbalized understanding.  Patient still left floor though against instructions.  Security spotted patient and escorted her back in.  Paged MD for nicotine patch

## 2017-04-10 NOTE — Addendum Note (Signed)
Addendum  created 04/10/17 0735 by Ollen Bowl, CRNA   Sign clinical note

## 2017-04-10 NOTE — Progress Notes (Signed)
1 Day Post-Op   Assessment and Plan: Left proximal stone with sepsis now s/p stenting.   She has persistent pain from the stent with voiding.   I will add tamsulosin to see if that will help the pain.    Subjective: Sally [name] is s/p left ureteral stenting for an obstructing stone with sepsis.  She is afebrile this morning but her WBC is up to 24K.  She has pain in the left flank with voiding since the stent was placed.   She has no nausea or vomiting.  She denies voiding complaints.  Cultures are pending.   ROS:  Review of Systems  Respiratory: Negative for shortness of breath.   Cardiovascular: Negative for chest pain.  Gastrointestinal: Negative for nausea and vomiting.  Genitourinary: Positive for dysuria and flank pain. Negative for urgency.    Anti-infectives: Anti-infectives    Start     Dose/Rate Route Frequency Ordered Stop   04/09/17 0215  cefTRIAXone (ROCEPHIN) 1 g in dextrose 5 % 50 mL IVPB     1 g 100 mL/hr over 30 Minutes Intravenous Every 24 hours 04/09/17 0201     04/09/17 0030  levofloxacin (LEVAQUIN) IVPB 500 mg     500 mg 100 mL/hr over 60 Minutes Intravenous  Once 04/09/17 0029 04/09/17 0142      Current Facility-Administered Medications  Medication Dose Route Frequency Provider Last Rate Last Dose  . acetaminophen (TYLENOL) tablet 650 mg  650 mg Oral Q6H PRN Jani Gravel, MD   650 mg at 04/09/17 1835   Or  . acetaminophen (TYLENOL) suppository 650 mg  650 mg Rectal Q6H PRN Jani Gravel, MD      . cefTRIAXone (ROCEPHIN) 1 g in dextrose 5 % 50 mL IVPB  1 g Intravenous Q24H Jani Gravel, MD   Stopped at 04/10/17 [phone]  . citalopram (CELEXA) tablet 20 mg  20 mg Oral Daily Jani Gravel, MD      . diazepam (VALIUM) tablet 2 mg  2 mg Oral Q6H PRN Jani Gravel, MD   2 mg at 04/09/17 1835  . enoxaparin (LOVENOX) injection 40 mg  40 mg Subcutaneous Q24H Jani Gravel, MD      . HYDROmorphone (DILAUDID) injection 0.5 mg  0.5 mg Intravenous Q4H PRN Jani Gravel, MD   0.5 mg at 04/10/17 0558   . metaxalone (SKELAXIN) tablet 800 mg  800 mg Oral QID Jani Gravel, MD   800 mg at 04/09/17 2244  . ondansetron (ZOFRAN) injection 4 mg  4 mg Intravenous Q6H PRN Jani Gravel, MD   4 mg at 04/09/17 1223  . oxybutynin (DITROPAN) tablet 5 mg  5 mg Oral Q8H PRN Franchot Gallo, MD   5 mg at 04/09/17 1742  . oxyCODONE-acetaminophen (PERCOCET/ROXICET) 5-325 MG per tablet 1 tablet  1 tablet Oral Q6H PRN Jani Gravel, MD   1 tablet at 04/09/17 2349  . promethazine (PHENERGAN) injection 6.25 mg  6.25 mg Intravenous Q6H PRN Jani Gravel, MD      . propranolol ER (INDERAL LA) 24 hr capsule 60 mg  60 mg Oral Daily Jani Gravel, MD         Objective: Vital signs in last 24 hours: Temp:  [98.4 F (36.9 C)-103.2 F (39.6 C)] 98.7 F (37.1 C) (10/05 0528) Pulse Rate:  [81-145] 82 (10/05 0528) Resp:  [12-37] 18 (10/05 0528) BP: (85-227)/(46-185) 92/52 (10/05 0528) SpO2:  [89 %-100 %] 100 % (10/05 0528) Weight:  [75.3 kg (166 lb)-76.3 kg (168 lb 4.8 oz)] 76.3  kg (168 lb 4.8 oz) (10/05 0528)  Intake/Output from previous day: 10/04 0701 - 10/05 0700 In: 4103.7 [P.O.:822; I.V.:2281.7; IV Piggyback:1000] Out: 2350 [Urine:2350] Intake/Output this shift: No intake/output data recorded.   Physical Exam  Constitutional: She is well-developed, well-nourished, and in no distress.  Cardiovascular: Normal rate and regular rhythm.   Pulmonary/Chest: Effort normal. No respiratory distress.  Abdominal: Soft. There is tenderness (left upper quadrant and flank).  Vitals reviewed.   Lab Results:   Recent Labs  04/09/17 0557 04/10/17 0541  WBC 11.7* 24.0*  HGB 12.6 11.0*  HCT 37.3 32.3*  PLT 233 157   BMET  Recent Labs  04/09/17 0557 04/10/17 0541  NA 136 137  K 3.2* 4.0  CL 100* 105  CO2 27 23  GLUCOSE 109* 102*  BUN 16 17  CREATININE 0.87 0.99  CALCIUM 8.8* 7.5*   PT/INR No results for input(s): LABPROT, INR in the last 72 hours. ABG No results for input(s): PHART, HCO3 in the last 72  hours.  Invalid input(s): PCO2, PO2  Studies/Results: Dg C-arm 1-60 Min-no Report  Result Date: 04/09/2017 Fluoroscopy was utilized by the requesting physician.  No radiographic interpretation.   Ct Renal Stone Study  Result Date: 04/08/2017 CLINICAL DATA:  Left flank pain and vomiting for 3 days. Kidney stone 5 years ago. EXAM: CT ABDOMEN AND PELVIS WITHOUT CONTRAST TECHNIQUE: Multidetector CT imaging of the abdomen and pelvis was performed following the standard protocol without IV contrast. COMPARISON:  06/12/2014 FINDINGS: Lower chest: The lung bases are clear. Hepatobiliary: No focal liver abnormality is seen. No gallstones, gallbladder wall thickening, or biliary dilatation. Pancreas: Unremarkable. No pancreatic ductal dilatation or surrounding inflammatory changes. Spleen: Normal in size without focal abnormality. Adrenals/Urinary Tract: No adrenal gland nodules. 4 mm stone in the proximal left ureter at the level of L3. Proximal left hydronephrosis and hydroureter. Stranding around the left kidney and ureter. Additional punctate sized intrarenal stones seen bilaterally. No hydronephrosis or hydroureter on the right. Bladder is unremarkable. Stomach/Bowel: Stomach is within normal limits. Appendix appears normal. No evidence of bowel wall thickening, distention, or inflammatory changes. Vascular/Lymphatic: No significant vascular findings are present. No enlarged abdominal or pelvic lymph nodes. Reproductive: Uterus and bilateral adnexa are unremarkable. Other: No abdominal wall hernia or abnormality. No abdominopelvic ascites. Musculoskeletal: No acute or significant osseous findings. IMPRESSION: 4 mm stone in the proximal left ureter with moderate proximal obstruction. Additional bilateral nonobstructing intrarenal stones. Electronically Signed   By: Lucienne Capers M.D.   On: 04/08/2017 23:45    Labs and notes reviewed.         LOS: 1 day    Malka So 04/10/2017 [id number] ID: Carey Bullocks, female   DOB: 28-May-1981, 36 y.o.   MRN: [id number]

## 2017-04-10 NOTE — Progress Notes (Signed)
PROGRESS NOTE                                                                                                                                                                                                             Patient Demographics:    Sally [name], is a 36 y.o. female, DOB - July 24, 1980, LAG:[id number]  Admit date - 04/08/2017   Admitting Physician Jani Gravel, MD  Outpatient Primary MD for the patient is Pickard, Cammie Mcgee, MD  LOS - 1  Outpatient Specialists:none  Chief Complaint  Patient presents with  . Flank Pain       Brief Narrative   36 year old healthy female admitted with 10 day history of left flank pain associated with nausea and subjective fever presented with UTI/acute pyelonephritis with obstructive left proximal ureteral stone.   Subjective:   Still having left flank pain and having to strain to urinate.. Afebrile since yesterday afternoon. Denies nausea or vomiting.  Assessment  & Plan :   Principal problem Sepsis secondary to UTI/acute pyelonephritis  With obstructing left proximal ureteral stone. Urology consult appreciated and patient underwent cystoscopy with left retrograde ureteropyelogram and placement of double-J stent. Worsened leukocytosis but remains afebrile overnight. Has low normal blood pressure. Will continue IV fluids.   Still has significant left flank pain. Continue pain control with IV Dilaudid when necessary. Supportive care with Tylenol and antiemetics. Next line encouraged to ambulate.  Urine culture growing gram-negative rods. Continue empiric IV Rocephin. Blood cultures negative to date.  Urology evaluated today and added Flomax for urinary strain.   Hypokalemia Replenished.    Code Status : Full code  Family Communication  : None at bedside  Disposition Plan  : Home once improved and final cultures obtained, possibly in the next 48 hours  Barriers For  Discharge : Active symptoms  Consults  :  Urology  Procedures  :  CT abdomen Cystoscopy with retrograde pyelogram and stenting  DVT Prophylaxis  :  Lovenox -   Lab Results  Component Value Date   PLT 157 04/10/2017    Antibiotics  :   Anti-infectives    Start     Dose/Rate Route Frequency Ordered Stop   04/09/17 0215  cefTRIAXone (ROCEPHIN) 1 g in dextrose 5 % 50 mL IVPB     1 g 100 mL/hr over 30 Minutes  Intravenous Every 24 hours 04/09/17 0201     04/09/17 0030  levofloxacin (LEVAQUIN) IVPB 500 mg     500 mg 100 mL/hr over 60 Minutes Intravenous  Once 04/09/17 0029 04/09/17 0142        Objective:   Vitals:   04/09/17 1600 04/09/17 2233 04/10/17 0200 04/10/17 0528  BP: (!) 101/52 (!) 91/50 (!) 85/46 (!) 92/52  Pulse: (!) 102 82 81 82  Resp: 20 18 18 18   Temp: 99.8 F (37.7 C) 99 F (37.2 C) 98.4 F (36.9 C) 98.7 F (37.1 C)  TempSrc:   Oral Oral  SpO2: 96% 98% 95% 100%  Weight:  75.3 kg (166 lb) 76.3 kg (168 lb 4.8 oz) 76.3 kg (168 lb 4.8 oz)  Height:   5\' 5"  (1.651 m) 5\' 5"  (1.651 m)    Wt Readings from Last 3 Encounters:  04/10/17 76.3 kg (168 lb 4.8 oz)  09/12/15 71.7 kg (158 lb)  07/13/15 73 kg (161 lb)     Intake/Output Summary (Last 24 hours) at 04/10/17 1025 Last data filed at 04/10/17 0947  Gross per 24 hour  Intake          4343.67 ml  Output             2350 ml  Net          1993.67 ml     Physical Exam Gen.: 36 year old female not in distress HEENT: Moist mucosa, supple neck Chest: Clear bilaterally   CVS: Normal S1 and S2, no murmurs GI: Soft, nondistended, bowel sounds present, left CVA tenderness Musculoskeletal: Warm, no edema     Data Review:    CBC  Recent Labs Lab 04/08/17 2212 04/09/17 0557 04/10/17 0541  WBC 15.5* 11.7* 24.0*  HGB 14.1 12.6 11.0*  HCT 42.3 37.3 32.3*  PLT 275 233 157  MCV 92.4 93.7 93.1  MCH 30.8 31.7 31.7  MCHC 33.3 33.8 34.1  RDW 12.9 13.1 13.6  LYMPHSABS 2.7  --   --   MONOABS 1.3*  --    --   EOSABS 0.1  --   --   BASOSABS 0.0  --   --     Chemistries   Recent Labs Lab 04/08/17 2212 04/09/17 0557 04/10/17 0541  NA 134* 136 137  K 3.5 3.2* 4.0  CL 98* 100* 105  CO2 25 27 23   GLUCOSE 97 109* 102*  BUN 17 16 17   CREATININE 0.84 0.87 0.99  CALCIUM 9.3 8.8* 7.5*  AST  --  19  --   ALT  --  14  --   ALKPHOS  --  65  --   BILITOT  --  0.8  --    ------------------------------------------------------------------------------------------------------------------ No results for input(s): CHOL, HDL, LDLCALC, TRIG, CHOLHDL, LDLDIRECT in the last 72 hours.  No results found for: HGBA1C ------------------------------------------------------------------------------------------------------------------ No results for input(s): TSH, T4TOTAL, T3FREE, THYROIDAB in the last 72 hours.  Invalid input(s): FREET3 ------------------------------------------------------------------------------------------------------------------ No results for input(s): VITAMINB12, FOLATE, FERRITIN, TIBC, IRON, RETICCTPCT in the last 72 hours.  Coagulation profile No results for input(s): INR, PROTIME in the last 168 hours.  No results for input(s): DDIMER in the last 72 hours.  Cardiac Enzymes No results for input(s): CKMB, TROPONINI, MYOGLOBIN in the last 168 hours.  Invalid input(s): CK ------------------------------------------------------------------------------------------------------------------ No results found for: BNP  Inpatient Medications  Scheduled Meds: . citalopram  20 mg Oral Daily  . enoxaparin (LOVENOX) injection  40 mg Subcutaneous Q24H  . metaxalone  800 mg Oral QID  . propranolol ER  60 mg Oral Daily  . tamsulosin  0.4 mg Oral Daily   Continuous Infusions: . sodium chloride 125 mL/hr at 04/10/17 0900  . cefTRIAXone (ROCEPHIN)  IV Stopped (04/10/17 0317)   PRN Meds:.acetaminophen **OR** acetaminophen, diazepam, HYDROmorphone (DILAUDID) injection, ondansetron  (ZOFRAN) IV, oxybutynin, oxyCODONE-acetaminophen, promethazine  Micro Results Recent Results (from the past 240 hour(s))  Urine culture     Status: Abnormal (Preliminary result)   Collection Time: 04/08/17  9:16 PM  Result Value Ref Range Status   Specimen Description URINE, CLEAN CATCH  Final   Special Requests NONE  Final   Culture >=100,000 COLONIES/mL GRAM NEGATIVE RODS (A)  Final   Report Status PENDING  Incomplete  Culture, blood (routine x 2)     Status: None (Preliminary result)   Collection Time: 04/09/17  1:13 AM  Result Value Ref Range Status   Specimen Description BLOOD RIGHT HAND  Final   Special Requests AEROBIC BOTTLE ONLY Blood Culture adequate volume  Final   Culture NO GROWTH 1 DAY  Final   Report Status PENDING  Incomplete  Culture, blood (routine x 2)     Status: None (Preliminary result)   Collection Time: 04/09/17  1:18 AM  Result Value Ref Range Status   Specimen Description BLOOD LEFT HAND  Final   Special Requests AEROBIC BOTTLE ONLY Blood Culture adequate volume  Final   Culture NO GROWTH 1 DAY  Final   Report Status PENDING  Incomplete    Radiology Reports Dg C-arm 1-60 Min-no Report  Result Date: 04/09/2017 Fluoroscopy was utilized by the requesting physician.  No radiographic interpretation.   Ct Renal Stone Study  Result Date: 04/08/2017 CLINICAL DATA:  Left flank pain and vomiting for 3 days. Kidney stone 5 years ago. EXAM: CT ABDOMEN AND PELVIS WITHOUT CONTRAST TECHNIQUE: Multidetector CT imaging of the abdomen and pelvis was performed following the standard protocol without IV contrast. COMPARISON:  06/12/2014 FINDINGS: Lower chest: The lung bases are clear. Hepatobiliary: No focal liver abnormality is seen. No gallstones, gallbladder wall thickening, or biliary dilatation. Pancreas: Unremarkable. No pancreatic ductal dilatation or surrounding inflammatory changes. Spleen: Normal in size without focal abnormality. Adrenals/Urinary Tract: No adrenal  gland nodules. 4 mm stone in the proximal left ureter at the level of L3. Proximal left hydronephrosis and hydroureter. Stranding around the left kidney and ureter. Additional punctate sized intrarenal stones seen bilaterally. No hydronephrosis or hydroureter on the right. Bladder is unremarkable. Stomach/Bowel: Stomach is within normal limits. Appendix appears normal. No evidence of bowel wall thickening, distention, or inflammatory changes. Vascular/Lymphatic: No significant vascular findings are present. No enlarged abdominal or pelvic lymph nodes. Reproductive: Uterus and bilateral adnexa are unremarkable. Other: No abdominal wall hernia or abnormality. No abdominopelvic ascites. Musculoskeletal: No acute or significant osseous findings. IMPRESSION: 4 mm stone in the proximal left ureter with moderate proximal obstruction. Additional bilateral nonobstructing intrarenal stones. Electronically Signed   By: Lucienne Capers M.D.   On: 04/08/2017 23:45    Time Spent in minutes 35   Louellen Molder M.D on 04/10/2017 at 10:25 AM  Between 7am to 7pm - Pager - [phone]  After 7pm go to [email or web address] - password [facility]  [facility] -  Office  [phone]

## 2017-04-10 NOTE — Anesthesia Postprocedure Evaluation (Signed)
Anesthesia Post Note  Patient: [name]  Procedure(s) Performed: CYSTOSCOPY WITH LEFT RETROGRADE PYELOGRAM/LEFT URETERAL STENT PLACEMENT (Left )  Patient location during evaluation: Nursing Unit Anesthesia Type: General Level of consciousness: awake and alert and oriented Pain management: pain level controlled Vital Signs Assessment: post-procedure vital signs reviewed and stable Respiratory status: spontaneous breathing Cardiovascular status: blood pressure returned to baseline Postop Assessment: no apparent nausea or vomiting and adequate PO intake     Last Vitals:  Vitals:   04/10/17 0200 04/10/17 0528  BP: (!) 85/46 (!) 92/52  Pulse: 81 82  Resp: 18 18  Temp: 36.9 C 37.1 C  SpO2: 95% 100%    Last Pain:  Vitals:   04/10/17 0528  TempSrc: Oral  PainSc:                  Tressie Stalker

## 2017-04-11 DIAGNOSIS — N201 Calculus of ureter: Secondary | ICD-10-CM

## 2017-04-11 DIAGNOSIS — N390 Urinary tract infection, site not specified: Secondary | ICD-10-CM

## 2017-04-11 DIAGNOSIS — N200 Calculus of kidney: Secondary | ICD-10-CM

## 2017-04-11 DIAGNOSIS — N23 Unspecified renal colic: Secondary | ICD-10-CM

## 2017-04-11 DIAGNOSIS — N10 Acute tubulo-interstitial nephritis: Secondary | ICD-10-CM

## 2017-04-11 DIAGNOSIS — A419 Sepsis, unspecified organism: Principal | ICD-10-CM

## 2017-04-11 LAB — CBC
HEMATOCRIT: 32.2 % — AB (ref 36.0–46.0)
Hemoglobin: 11.3 g/dL — ABNORMAL LOW (ref 12.0–15.0)
MCH: 32.9 pg (ref 26.0–34.0)
MCHC: 35.1 g/dL (ref 30.0–36.0)
MCV: 93.9 fL (ref 78.0–100.0)
Platelets: 156 10*3/uL (ref 150–400)
RBC: 3.43 MIL/uL — ABNORMAL LOW (ref 3.87–5.11)
RDW: 13.4 % (ref 11.5–15.5)
WBC: 20.5 10*3/uL — ABNORMAL HIGH (ref 4.0–10.5)

## 2017-04-11 LAB — URINE CULTURE

## 2017-04-11 LAB — BASIC METABOLIC PANEL
ANION GAP: 9 (ref 5–15)
BUN: 17 mg/dL (ref 6–20)
CALCIUM: 8.1 mg/dL — AB (ref 8.9–10.3)
CO2: 19 mmol/L — AB (ref 22–32)
Chloride: 110 mmol/L (ref 101–111)
Creatinine, Ser: 0.8 mg/dL (ref 0.44–1.00)
GFR calc Af Amer: >60 mL/min (ref >60)
GFR calc non Af Amer: >60 mL/min (ref >60)
GLUCOSE: 71 mg/dL (ref 65–99)
Potassium: 4.1 mmol/L (ref 3.5–5.1)
Sodium: 138 mmol/L (ref 135–145)

## 2017-04-11 MED ORDER — OXYCODONE-ACETAMINOPHEN 5-325 MG PO TABS
1.0000 | ORAL_TABLET | Freq: Four times a day (QID) | ORAL | Status: DC | PRN
  Administered 2017-04-11 (×3): 1 via ORAL
  Administered 2017-04-12 (×2): 2 via ORAL
  Filled 2017-04-11 (×3): qty 1
  Filled 2017-04-11: qty 2
  Filled 2017-04-11: qty 1
  Filled 2017-04-11: qty 2

## 2017-04-11 NOTE — Progress Notes (Signed)
PROGRESS NOTE                                                                                                                                                                                                             Patient Demographics:    Sally [name], is a 36 y.o. female, DOB - Jul 25, 1980, [id number]  Admit date - 04/08/2017   Admitting Physician Jani Gravel, MD  Outpatient Primary MD for the patient is Pickard, Cammie Mcgee, MD  LOS - 2  Outpatient Specialists:none  Chief Complaint  Patient presents with  . Flank Pain       Brief Narrative   36 year old healthy female admitted with 10 day history of left flank pain associated with nausea and subjective fever presented with UTI/acute pyelonephritis with obstructive left proximal ureteral stone.   Subjective:   Continues to have bilateral flank pain. Once IV fluids discontinued since she has to urinate frequently. Reports adequate by mouth intake  Assessment  & Plan :   Principal problem Sepsis secondary to UTI/acute pyelonephritis  With obstructing left proximal ureteral stone. Urology consult appreciated and patient underwent cystoscopy with left retrograde ureteropyelogram and placement of double-J stent. Still has significant leukocytosis with low-grade temp overnight. Continue IV antibiotics for now. She is taking adequate fluids by mouth, will discontinue further IV fluids. Blood pressure has been stable.   Still has significant left flank pain. Try and manage pain with oral pain medicines. Supportive care with Tylenol and antiemetics.   Encouraged to ambulate.  Urine culture growing Escherichia coli. Continue empiric IV Rocephin. Blood cultures negative to date.  Urology evaluated today and added Flomax for urinary strain. She still having some low-grade fevers. Continue current treatments for now. Once afebrile, transition to oral antibiotics.   Hypokalemia Replenished.    Code Status : Full code  Family Communication  : None at bedside  Disposition Plan  : Home once improved and final cultures obtained, possibly in the next 24 hours  Barriers For Discharge : Active symptoms  Consults  :  Urology  Procedures  :  CT abdomen Cystoscopy with retrograde pyelogram and stenting  DVT Prophylaxis  :  Lovenox -   Lab Results  Component Value Date   PLT 156 04/11/2017    Antibiotics  :   Anti-infectives    Start  Dose/Rate Route Frequency Ordered Stop   04/09/17 0215  cefTRIAXone (ROCEPHIN) 1 g in dextrose 5 % 50 mL IVPB     1 g 100 mL/hr over 30 Minutes Intravenous Every 24 hours 04/09/17 0201     04/09/17 0030  levofloxacin (LEVAQUIN) IVPB 500 mg     500 mg 100 mL/hr over 60 Minutes Intravenous  Once 04/09/17 0029 04/09/17 0142        Objective:   Vitals:   04/10/17 2106 04/10/17 2316 04/11/17 0548 04/11/17 1253  BP: 109/65 127/77 (!) 142/76 (!) 142/76  Pulse:  73 (!) 56 (!) 50  Resp: 18  18 18   Temp: 98.6 F (37 C) 100.1 F (37.8 C) 98.2 F (36.8 C) 98.2 F (36.8 C)  TempSrc: Oral  Oral Oral  SpO2: (!) 86% 91% 99% 95%  Weight:   78.5 kg (173 lb 1.6 oz)   Height:        Wt Readings from Last 3 Encounters:  04/11/17 78.5 kg (173 lb 1.6 oz)  09/12/15 71.7 kg (158 lb)  07/13/15 73 kg (161 lb)     Intake/Output Summary (Last 24 hours) at 04/11/17 1437 Last data filed at 04/11/17 0903  Gross per 24 hour  Intake          1319.58 ml  Output             3000 ml  Net         -1680.42 ml     Physical Exam Gen.: Middle aged female not in distress HEENT: Moist mucosa, supple neck Chest: Clear bilaterally   CVS: Normal S1 and S2, no murmurs GI: Soft, nondistended, bowel sounds present, Bilateral CVA tenderness Musculoskeletal: Warm, no edema     Data Review:    CBC  Recent Labs Lab 04/08/17 2212 04/09/17 0557 04/10/17 0541 04/11/17 0410  WBC 15.5* 11.7* 24.0* 20.5*  HGB 14.1 12.6  11.0* 11.3*  HCT 42.3 37.3 32.3* 32.2*  PLT 275 233 157 156  MCV 92.4 93.7 93.1 93.9  MCH 30.8 31.7 31.7 32.9  MCHC 33.3 33.8 34.1 35.1  RDW 12.9 13.1 13.6 13.4  LYMPHSABS 2.7  --   --   --   MONOABS 1.3*  --   --   --   EOSABS 0.1  --   --   --   BASOSABS 0.0  --   --   --     Chemistries   Recent Labs Lab 04/08/17 2212 04/09/17 0557 04/10/17 0541 04/11/17 0410  NA 134* 136 137 138  K 3.5 3.2* 4.0 4.1  CL 98* 100* 105 110  CO2 25 27 23  19*  GLUCOSE 97 109* 102* 71  BUN 17 16 17 17   CREATININE 0.84 0.87 0.99 0.80  CALCIUM 9.3 8.8* 7.5* 8.1*  AST  --  19  --   --   ALT  --  14  --   --   ALKPHOS  --  65  --   --   BILITOT  --  0.8  --   --    ------------------------------------------------------------------------------------------------------------------ No results for input(s): CHOL, HDL, LDLCALC, TRIG, CHOLHDL, LDLDIRECT in the last 72 hours.  No results found for: HGBA1C ------------------------------------------------------------------------------------------------------------------ No results for input(s): TSH, T4TOTAL, T3FREE, THYROIDAB in the last 72 hours.  Invalid input(s): FREET3 ------------------------------------------------------------------------------------------------------------------ No results for input(s): VITAMINB12, FOLATE, FERRITIN, TIBC, IRON, RETICCTPCT in the last 72 hours.  Coagulation profile No results for input(s): INR, PROTIME in the last 168 hours.  No results for input(s): DDIMER in the last 72 hours.  Cardiac Enzymes No results for input(s): CKMB, TROPONINI, MYOGLOBIN in the last 168 hours.  Invalid input(s): CK ------------------------------------------------------------------------------------------------------------------ No results found for: BNP  Inpatient Medications  Scheduled Meds: . citalopram  20 mg Oral Daily  . enoxaparin (LOVENOX) injection  40 mg Subcutaneous Q24H  . metaxalone  800 mg Oral QID  . nicotine   21 mg Transdermal Daily  . propranolol ER  60 mg Oral Daily  . tamsulosin  0.4 mg Oral Daily   Continuous Infusions: . cefTRIAXone (ROCEPHIN)  IV Stopped (04/11/17 0315)   PRN Meds:.acetaminophen **OR** acetaminophen, diazepam, ondansetron (ZOFRAN) IV, oxybutynin, oxyCODONE-acetaminophen, promethazine  Micro Results Recent Results (from the past 240 hour(s))  Urine culture     Status: Abnormal   Collection Time: 04/08/17  9:16 PM  Result Value Ref Range Status   Specimen Description URINE, CLEAN CATCH  Final   Special Requests NONE  Final   Culture >=100,000 COLONIES/mL ESCHERICHIA COLI (A)  Final   Report Status 04/11/2017 FINAL  Final   Organism ID, Bacteria ESCHERICHIA COLI (A)  Final      Susceptibility   Escherichia coli - MIC*    AMPICILLIN >=32 RESISTANT Resistant     CEFAZOLIN <=4 SENSITIVE Sensitive     CEFTRIAXONE <=1 SENSITIVE Sensitive     CIPROFLOXACIN <=0.25 SENSITIVE Sensitive     GENTAMICIN <=1 SENSITIVE Sensitive     IMIPENEM <=0.25 SENSITIVE Sensitive     NITROFURANTOIN <=16 SENSITIVE Sensitive     TRIMETH/SULFA >=320 RESISTANT Resistant     AMPICILLIN/SULBACTAM 8 SENSITIVE Sensitive     PIP/TAZO <=4 SENSITIVE Sensitive     Extended ESBL NEGATIVE Sensitive     * >=100,000 COLONIES/mL ESCHERICHIA COLI  Culture, blood (routine x 2)     Status: None (Preliminary result)   Collection Time: 04/09/17  1:13 AM  Result Value Ref Range Status   Specimen Description BLOOD RIGHT HAND  Final   Special Requests AEROBIC BOTTLE ONLY Blood Culture adequate volume  Final   Culture NO GROWTH 2 DAYS  Final   Report Status PENDING  Incomplete  Culture, blood (routine x 2)     Status: None (Preliminary result)   Collection Time: 04/09/17  1:18 AM  Result Value Ref Range Status   Specimen Description BLOOD LEFT HAND  Final   Special Requests AEROBIC BOTTLE ONLY Blood Culture adequate volume  Final   Culture NO GROWTH 2 DAYS  Final   Report Status PENDING  Incomplete     Radiology Reports Dg C-arm 1-60 Min-no Report  Result Date: 04/09/2017 Fluoroscopy was utilized by the requesting physician.  No radiographic interpretation.   Ct Renal Stone Study  Result Date: 04/08/2017 CLINICAL DATA:  Left flank pain and vomiting for 3 days. Kidney stone 5 years ago. EXAM: CT ABDOMEN AND PELVIS WITHOUT CONTRAST TECHNIQUE: Multidetector CT imaging of the abdomen and pelvis was performed following the standard protocol without IV contrast. COMPARISON:  06/12/2014 FINDINGS: Lower chest: The lung bases are clear. Hepatobiliary: No focal liver abnormality is seen. No gallstones, gallbladder wall thickening, or biliary dilatation. Pancreas: Unremarkable. No pancreatic ductal dilatation or surrounding inflammatory changes. Spleen: Normal in size without focal abnormality. Adrenals/Urinary Tract: No adrenal gland nodules. 4 mm stone in the proximal left ureter at the level of L3. Proximal left hydronephrosis and hydroureter. Stranding around the left kidney and ureter. Additional punctate sized intrarenal stones seen bilaterally. No hydronephrosis or hydroureter on the right. Bladder  is unremarkable. Stomach/Bowel: Stomach is within normal limits. Appendix appears normal. No evidence of bowel wall thickening, distention, or inflammatory changes. Vascular/Lymphatic: No significant vascular findings are present. No enlarged abdominal or pelvic lymph nodes. Reproductive: Uterus and bilateral adnexa are unremarkable. Other: No abdominal wall hernia or abnormality. No abdominopelvic ascites. Musculoskeletal: No acute or significant osseous findings. IMPRESSION: 4 mm stone in the proximal left ureter with moderate proximal obstruction. Additional bilateral nonobstructing intrarenal stones. Electronically Signed   By: Lucienne Capers M.D.   On: 04/08/2017 23:45    Time Spent in minutes 25   MEMON,JEHANZEB M.D on 04/11/2017 at 2:37 PM  Between 7am to 7pm - Pager - [phone]  After 7pm  go to [email or web address] - password [facility]  [facility] -  Office  [phone]

## 2017-04-11 NOTE — Progress Notes (Signed)
Patient congested/productive cough.  Crackles upper/lower on assessment.  Dr. Roderic Palau notified via text page.

## 2017-04-11 NOTE — Progress Notes (Signed)
Patient not in room.  Security called and notified.  Patient found outside coming back in through the main entrance.  .  Patient observed smoking outside front entrance by an employee.  Security returned patient to their room.  Patient alert and oriented.  Patient c/o pain.  Patient educated that she is not to leave the floor and that patients are not allowed to smoke.  Patient educated that she may walk on unit but not to leave the unit.  Dr. Roderic Palau notified.

## 2017-04-12 LAB — CBC
HEMATOCRIT: 32 % — AB (ref 36.0–46.0)
HEMOGLOBIN: 10.9 g/dL — AB (ref 12.0–15.0)
MCH: 31.3 pg (ref 26.0–34.0)
MCHC: 34.1 g/dL (ref 30.0–36.0)
MCV: 92 fL (ref 78.0–100.0)
Platelets: 181 10*3/uL (ref 150–400)
RBC: 3.48 MIL/uL — AB (ref 3.87–5.11)
RDW: 12.9 % (ref 11.5–15.5)
WBC: 17.4 10*3/uL — AB (ref 4.0–10.5)

## 2017-04-12 LAB — BASIC METABOLIC PANEL
Anion gap: 12 (ref 5–15)
BUN: 14 mg/dL (ref 6–20)
CHLORIDE: 106 mmol/L (ref 101–111)
CO2: 18 mmol/L — ABNORMAL LOW (ref 22–32)
CREATININE: 0.77 mg/dL (ref 0.44–1.00)
Calcium: 8.2 mg/dL — ABNORMAL LOW (ref 8.9–10.3)
GFR calc non Af Amer: >60 mL/min (ref >60)
Glucose, Bld: 77 mg/dL (ref 65–99)
Potassium: 3.6 mmol/L (ref 3.5–5.1)
Sodium: 136 mmol/L (ref 135–145)

## 2017-04-12 MED ORDER — OXYCODONE-ACETAMINOPHEN 5-325 MG PO TABS: 1.0000 | ORAL_TABLET | Freq: Four times a day (QID) | ORAL | 0 refills | Status: DC | PRN

## 2017-04-12 MED ORDER — TAMSULOSIN HCL 0.4 MG PO CAPS: 0.4000 mg | ORAL_CAPSULE | Freq: Every day | ORAL | 0 refills | Status: DC

## 2017-04-12 MED ORDER — CIPROFLOXACIN HCL 500 MG PO TABS: 500.0000 mg | ORAL_TABLET | Freq: Two times a day (BID) | ORAL | 0 refills | Status: AC

## 2017-04-12 NOTE — Discharge Summary (Signed)
Physician Discharge Summary  Valarie Farace Belair UUV:[id number] DOB: [birth year] DOA: 04/08/2017  PCP: Susy Frizzle, MD  Admit date: 04/08/2017 Discharge date: 04/12/2017  Admitted From: home Disposition:  home  Recommendations for Outpatient Follow-up:  1. Follow up with PCP in 1-2 weeks 2. Please obtain BMP/CBC in one week 3. Follow up with urology, they will schedule appointment  Discharge Condition: stable CODE STATUS: full code Diet recommendation: regular diet  Brief/Interim Summary: 36 year old healthy female admitted with 10 day history of left flank pain associated with nausea and subjective fever presented with UTI/acute pyelonephritis with obstructive left proximal ureteral stone.  Discharge Diagnoses:  Principal Problem:   Sepsis secondary to UTI [facility]) Active Problems:   Nephrolithiasis   Fever, unspecified   UTI (urinary tract infection)   Acute pyelonephritis  Sepsis secondary to UTI/acute pyelonephritis  With obstructing left proximal ureteral stone. Urology consult appreciated and patient underwent cystoscopy with left retrograde ureteropyelogram and placement of double-J stent. Leukocytosis slowly improving and she has been afebrile. She was treated with IV antibiotics and has since been transitioned to po. Hemodynamics are stable. Patient is ambulating and appears asymptomatic.   Still has significant left flank pain which is being managed by oral pain medicines. Supportive care with Tylenol and antiemetics.   Encouraged to ambulate.  Urine culture growing Escherichia coli.  Treated with IV Rocephin and transitioned to oral cipro based on sensitivities. Blood cultures negative to date.  Urology evaluated and added Flomax for urinary strain. Patient will follow up with them as an outpatient.   Hypokalemia Replenished.  Discharge Instructions  Discharge Instructions    Diet - low sodium heart healthy    Complete by:  As directed    Increase  activity slowly    Complete by:  As directed      Allergies as of 04/12/2017      Reactions   Morphine And Related Itching   Ibuprofen Itching, Rash      Medication List    STOP taking these medications   HYDROcodone-acetaminophen 5-325 MG tablet Commonly known as:  NORCO/VICODIN     TAKE these medications   acetaminophen 500 MG tablet Commonly known as:  TYLENOL Take 1 tablet (500 mg total) by mouth every 6 (six) hours as needed for mild pain.   ciprofloxacin 500 MG tablet Commonly known as:  CIPRO Take 1 tablet (500 mg total) by mouth 2 (two) times daily.   citalopram 20 MG tablet Commonly known as:  CELEXA Take 1 tablet (20 mg total) by mouth daily.   diazepam 2 MG tablet Commonly known as:  VALIUM Take 1 tablet (2 mg total) by mouth every 6 (six) hours as needed for anxiety.   metaxalone 800 MG tablet Commonly known as:  SKELAXIN Take 1 tablet (800 mg total) by mouth 4 (four) times daily.   multivitamin with minerals Tabs tablet Take 1 tablet by mouth daily.   oxyCODONE-acetaminophen 5-325 MG tablet Commonly known as:  PERCOCET/ROXICET Take 1 tablet by mouth every 6 (six) hours as needed for severe pain.   propranolol ER 60 MG 24 hr capsule Commonly known as:  INDERAL LA Take 1 capsule (60 mg total) by mouth daily. For migraines   tamsulosin 0.4 MG Caps capsule Commonly known as:  FLOMAX Take 1 capsule (0.4 mg total) by mouth daily.      Follow-up Information    Franchot Gallo, MD.   Specialty:  Urology Why:  We will call you to schedule follow-up in our  [place] office Contact information: [address] [place] [address] [phone]          Allergies  Allergen Reactions  . Morphine And Related Itching  . Ibuprofen Itching and Rash    Consultations:  urology   Procedures/Studies: Dg C-arm 1-60 Min-no Report  Result Date: 04/09/2017 Fluoroscopy was utilized by the requesting physician.  No radiographic  interpretation.   Ct Renal Stone Study  Result Date: 04/08/2017 CLINICAL DATA:  Left flank pain and vomiting for 3 days. Kidney stone 5 years ago. EXAM: CT ABDOMEN AND PELVIS WITHOUT CONTRAST TECHNIQUE: Multidetector CT imaging of the abdomen and pelvis was performed following the standard protocol without IV contrast. COMPARISON:  06/12/2014 FINDINGS: Lower chest: The lung bases are clear. Hepatobiliary: No focal liver abnormality is seen. No gallstones, gallbladder wall thickening, or biliary dilatation. Pancreas: Unremarkable. No pancreatic ductal dilatation or surrounding inflammatory changes. Spleen: Normal in size without focal abnormality. Adrenals/Urinary Tract: No adrenal gland nodules. 4 mm stone in the proximal left ureter at the level of L3. Proximal left hydronephrosis and hydroureter. Stranding around the left kidney and ureter. Additional punctate sized intrarenal stones seen bilaterally. No hydronephrosis or hydroureter on the right. Bladder is unremarkable. Stomach/Bowel: Stomach is within normal limits. Appendix appears normal. No evidence of bowel wall thickening, distention, or inflammatory changes. Vascular/Lymphatic: No significant vascular findings are present. No enlarged abdominal or pelvic lymph nodes. Reproductive: Uterus and bilateral adnexa are unremarkable. Other: No abdominal wall hernia or abnormality. No abdominopelvic ascites. Musculoskeletal: No acute or significant osseous findings. IMPRESSION: 4 mm stone in the proximal left ureter with moderate proximal obstruction. Additional bilateral nonobstructing intrarenal stones. Electronically Signed   By: Lucienne Capers M.D.   On: 04/08/2017 23:45    CT abdomen Cystoscopy with retrograde pyelogram and stenting   Subjective: Continues to have bilateral flank pain. Has some pressure in suprapubic region. Post void residual bladder scan is negative.  Discharge Exam: Vitals:   04/11/17 2052 04/12/17 0531  BP: (!) 144/60  124/65  Pulse: 68 (!) 57  Resp: 18 18  Temp: 98.1 F (36.7 C) 98.1 F (36.7 C)  SpO2: 93% 93%   Vitals:   04/11/17 0548 04/11/17 1253 04/11/17 2052 04/12/17 0531  BP: (!) 142/76 (!) 142/76 (!) 144/60 124/65  Pulse: (!) 56 (!) 50 68 (!) 57  Resp: 18 18 18 18   Temp: 98.2 F (36.8 C) 98.2 F (36.8 C) 98.1 F (36.7 C) 98.1 F (36.7 C)  TempSrc: Oral Oral Oral Oral  SpO2: 99% 95% 93% 93%  Weight: 78.5 kg (173 lb 1.6 oz)   77 kg (169 lb 12.8 oz)  Height:        General: Pt is alert, awake, not in acute distress Cardiovascular: RRR, S1/S2 +, no rubs, no gallops Respiratory: CTA bilaterally, no wheezing, no rhonchi Abdominal: Soft, NT, ND, bowel sounds + Extremities: no edema, no cyanosis    The results of significant diagnostics from this hospitalization (including imaging, microbiology, ancillary and laboratory) are listed below for reference.     Microbiology: Recent Results (from the past 240 hour(s))  Urine culture     Status: Abnormal   Collection Time: 04/08/17  9:16 PM  Result Value Ref Range Status   Specimen Description URINE, CLEAN CATCH  Final   Special Requests NONE  Final   Culture >=100,000 COLONIES/mL ESCHERICHIA COLI (A)  Final   Report Status 04/11/2017 FINAL  Final   Organism ID, Bacteria ESCHERICHIA COLI (A)  Final      Susceptibility   Escherichia coli - MIC*    AMPICILLIN >=32 RESISTANT Resistant     CEFAZOLIN <=4 SENSITIVE Sensitive     CEFTRIAXONE <=1 SENSITIVE Sensitive     CIPROFLOXACIN <=0.25 SENSITIVE Sensitive     GENTAMICIN <=1 SENSITIVE Sensitive     IMIPENEM <=0.25 SENSITIVE Sensitive     NITROFURANTOIN <=16 SENSITIVE Sensitive     TRIMETH/SULFA >=320 RESISTANT Resistant     AMPICILLIN/SULBACTAM 8 SENSITIVE Sensitive     PIP/TAZO <=4 SENSITIVE Sensitive     Extended ESBL NEGATIVE Sensitive     * >=100,000 COLONIES/mL ESCHERICHIA COLI  Culture, blood (routine x 2)     Status: None (Preliminary result)   Collection Time: 04/09/17  1:13  AM  Result Value Ref Range Status   Specimen Description BLOOD RIGHT HAND  Final   Special Requests AEROBIC BOTTLE ONLY Blood Culture adequate volume  Final   Culture NO GROWTH 3 DAYS  Final   Report Status PENDING  Incomplete  Culture, blood (routine x 2)     Status: None (Preliminary result)   Collection Time: 04/09/17  1:18 AM  Result Value Ref Range Status   Specimen Description BLOOD LEFT HAND  Final   Special Requests AEROBIC BOTTLE ONLY Blood Culture adequate volume  Final   Culture NO GROWTH 3 DAYS  Final   Report Status PENDING  Incomplete     Labs: BNP (last 3 results) No results for input(s): BNP in the last 8760 hours. Basic Metabolic Panel:  Recent Labs Lab 04/08/17 2212 04/09/17 0557 04/10/17 0541 04/11/17 0410 04/12/17 0419  NA 134* 136 137 138 136  K 3.5 3.2* 4.0 4.1 3.6  CL 98* 100* 105 110 106  CO2 25 27 23  19* 18*  GLUCOSE 97 109* 102* 71 77  BUN 17 16 17 17 14   CREATININE 0.84 0.87 0.99 0.80 0.77  CALCIUM 9.3 8.8* 7.5* 8.1* 8.2*   Liver Function Tests:  Recent Labs Lab 04/09/17 0557  AST 19  ALT 14  ALKPHOS 65  BILITOT 0.8  PROT 5.9*  ALBUMIN 3.4*   No results for input(s): LIPASE, AMYLASE in the last 168 hours. No results for input(s): AMMONIA in the last 168 hours. CBC:  Recent Labs Lab 04/08/17 2212 04/09/17 0557 04/10/17 0541 04/11/17 0410 04/12/17 0419  WBC 15.5* 11.7* 24.0* 20.5* 17.4*  NEUTROABS 11.4*  --   --   --   --   HGB 14.1 12.6 11.0* 11.3* 10.9*  HCT 42.3 37.3 32.3* 32.2* 32.0*  MCV 92.4 93.7 93.1 93.9 92.0  PLT 275 233 157 156 181   Cardiac Enzymes: No results for input(s): CKTOTAL, CKMB, CKMBINDEX, TROPONINI in the last 168 hours. BNP: Invalid input(s): POCBNP CBG: No results for input(s): GLUCAP in the last 168 hours. D-Dimer No results for input(s): DDIMER in the last 72 hours. Hgb A1c No results for input(s): HGBA1C in the last 72 hours. Lipid Profile No results for input(s): CHOL, HDL, LDLCALC, TRIG,  CHOLHDL, LDLDIRECT in the last 72 hours. Thyroid function studies No results for input(s): TSH, T4TOTAL, T3FREE, THYROIDAB in the last 72 hours.  Invalid input(s): FREET3 Anemia work up No results for input(s): VITAMINB12, FOLATE, FERRITIN, TIBC, IRON, RETICCTPCT in the last 72 hours. Urinalysis    Component Value Date/Time   COLORURINE YELLOW 04/08/2017 2116   APPEARANCEUR CLOUDY (A) 04/08/2017 2116   LABSPEC 1.016 04/08/2017 2116   PHURINE 6.0 04/08/2017 2116   GLUCOSEU NEGATIVE 04/08/2017 2116  HGBUR MODERATE (A) 04/08/2017 2116   BILIRUBINUR NEGATIVE 04/08/2017 2116   KETONESUR NEGATIVE 04/08/2017 2116   PROTEINUR 30 (A) 04/08/2017 2116   UROBILINOGEN 0.2 05/02/2015 0005   NITRITE POSITIVE (A) 04/08/2017 2116   LEUKOCYTESUR LARGE (A) 04/08/2017 2116   Sepsis Labs Invalid input(s): PROCALCITONIN,  WBC,  LACTICIDVEN Microbiology Recent Results (from the past 240 hour(s))  Urine culture     Status: Abnormal   Collection Time: 04/08/17  9:16 PM  Result Value Ref Range Status   Specimen Description URINE, CLEAN CATCH  Final   Special Requests NONE  Final   Culture >=100,000 COLONIES/mL ESCHERICHIA COLI (A)  Final   Report Status 04/11/2017 FINAL  Final   Organism ID, Bacteria ESCHERICHIA COLI (A)  Final      Susceptibility   Escherichia coli - MIC*    AMPICILLIN >=32 RESISTANT Resistant     CEFAZOLIN <=4 SENSITIVE Sensitive     CEFTRIAXONE <=1 SENSITIVE Sensitive     CIPROFLOXACIN <=0.25 SENSITIVE Sensitive     GENTAMICIN <=1 SENSITIVE Sensitive     IMIPENEM <=0.25 SENSITIVE Sensitive     NITROFURANTOIN <=16 SENSITIVE Sensitive     TRIMETH/SULFA >=320 RESISTANT Resistant     AMPICILLIN/SULBACTAM 8 SENSITIVE Sensitive     PIP/TAZO <=4 SENSITIVE Sensitive     Extended ESBL NEGATIVE Sensitive     * >=100,000 COLONIES/mL ESCHERICHIA COLI  Culture, blood (routine x 2)     Status: None (Preliminary result)   Collection Time: 04/09/17  1:13 AM  Result Value Ref Range  Status   Specimen Description BLOOD RIGHT HAND  Final   Special Requests AEROBIC BOTTLE ONLY Blood Culture adequate volume  Final   Culture NO GROWTH 3 DAYS  Final   Report Status PENDING  Incomplete  Culture, blood (routine x 2)     Status: None (Preliminary result)   Collection Time: 04/09/17  1:18 AM  Result Value Ref Range Status   Specimen Description BLOOD LEFT HAND  Final   Special Requests AEROBIC BOTTLE ONLY Blood Culture adequate volume  Final   Culture NO GROWTH 3 DAYS  Final   Report Status PENDING  Incomplete     Time coordinating discharge: Over 30 minutes  SIGNED:   Kathie Dike, MD  [facility] 04/12/2017, 11:16 AM Pager   If 7PM-7AM, please contact night-coverage [email or web address] Password [email or web address]

## 2017-04-12 NOTE — Progress Notes (Signed)
Pt discharged in stable condition via wheelchair into the care of her family via private vehicle.  PIV removed intact w/o S&S of complications.  Discharge instructions reviewed with pt.  Pt verbalized understanding.  Prescriptions given to pt.

## 2017-04-14 LAB — CULTURE, BLOOD (ROUTINE X 2)
CULTURE: NO GROWTH
Culture: NO GROWTH
Special Requests: ADEQUATE
Special Requests: ADEQUATE

## 2017-04-21 ENCOUNTER — Ambulatory Visit (INDEPENDENT_AMBULATORY_CARE_PROVIDER_SITE_OTHER): Payer: Self-pay | Admitting: Urology

## 2017-04-21 ENCOUNTER — Other Ambulatory Visit (HOSPITAL_COMMUNITY)
Admission: RE | Admit: 2017-04-21 | Discharge: 2017-04-21 | Disposition: A | Discharge status: Home / Self Care | Payer: Self-pay | Source: Other Acute Inpatient Hospital | Attending: Urology | Admitting: Urology

## 2017-04-21 DIAGNOSIS — N201 Calculus of ureter: Secondary | ICD-10-CM

## 2017-04-23 ENCOUNTER — Other Ambulatory Visit: Payer: Self-pay | Admitting: Urology

## 2017-04-23 LAB — URINE CULTURE

## 2017-04-23 NOTE — Patient Instructions (Signed)
Sally [name]  04/23/2017     @PREFPERIOPPHARMACY @   Your procedure is scheduled on  04/28/2017   Report to [facility] at  1130  A.M.  Call this number if you have problems the morning of surgery:  [phone]   Remember:  Do not eat food or drink liquids after midnight.  Take these medicines the morning of surgery with A SIP OF WATER  Celexa, valium, inderal, ultram, flomax.   Do not wear jewelry, make-up or nail polish.  Do not wear lotions, powders, or perfumes, or deoderant.  Do not shave 48 hours prior to surgery.  Men may shave face and neck.  Do not bring valuables to the hospital.  [facility] is not responsible for any belongings or valuables.  Contacts, dentures or bridgework may not be worn into surgery.  Leave your suitcase in the car.  After surgery it may be brought to your room.  For patients admitted to the hospital, discharge time will be determined by your treatment team.  Patients discharged the day of surgery will not be allowed to drive home.   Name and phone number of your driver:   family Special instructions:  None  Please read over the following fact sheets that you were given. Anesthesia Post-op Instructions and Care and Recovery After Surgery       Ureteroscopy Ureteroscopy is a procedure to check for and treat problems inside part of the urinary tract. In this procedure, a thin, tube-shaped instrument with a light at the end (ureteroscope) is used to look at the inside of the kidneys and the ureters, which are the tubes that carry urine from the kidneys to the bladder. The ureteroscope is inserted into one or both of the ureters. You may need this procedure if you have frequent urinary tract infections (UTIs), blood in your urine, or a stone in one of your ureters. A ureteroscopy can be done to find the cause of urine blockage in a ureter and to evaluate other abnormalities inside the ureters or kidneys. If stones  are found, they can be removed during the procedure. Polyps, abnormal tissue, and some types of tumors can also be removed or treated. The ureteroscope may also have a tool to remove tissue to be checked for disease under a microscope (biopsy). Tell a health care provider about:  Any allergies you have.  All medicines you are taking, including vitamins, herbs, eye drops, creams, and over-the-counter medicines.  Any problems you or family members have had with anesthetic medicines.  Any blood disorders you have.  Any surgeries you have had.  Any medical conditions you have.  Whether you are pregnant or may be pregnant. What are the risks? Generally, this is a safe procedure. However, problems may occur, including:  Bleeding.  Infection.  Allergic reactions to medicines.  Scarring that narrows the ureter (stricture).  Creating a hole in the ureter (perforation).  What happens before the procedure? Staying hydrated Follow instructions from your health care provider about hydration, which may include:  Up to 2 hours before the procedure - you may continue to drink clear liquids, such as water, clear fruit juice, black coffee, and plain tea.  Eating and drinking restrictions Follow instructions from your health care provider about eating and drinking, which may include:  8 hours before the procedure - stop eating heavy meals or foods such as meat, fried foods, or  fatty foods.  6 hours before the procedure - stop eating light meals or foods, such as toast or cereal.  6 hours before the procedure - stop drinking milk or drinks that contain milk.  2 hours before the procedure - stop drinking clear liquids.  Medicines  Ask your health care provider about: ? Changing or stopping your regular medicines. This is especially important if you are taking diabetes medicines or blood thinners. ? Taking medicines such as aspirin and ibuprofen. These medicines can thin your blood. Do  not take these medicines before your procedure if your health care provider instructs you not to.  You may be given antibiotic medicine to help prevent infection. General instructions  You may have a urine sample taken to check for infection.  Plan to have someone take you home from the hospital or clinic. What happens during the procedure?  To reduce your risk of infection: ? Your health care team will wash or sanitize their hands. ? Your skin will be washed with soap.  An IV tube will be inserted into one of your veins.  You will be given one of the following: ? A medicine to help you relax (sedative). ? A medicine to make you fall asleep (general anesthetic). ? A medicine that is injected into your spine to numb the area below and slightly above the injection site (spinal anesthetic).  To lower your risk of infection, you may be given an antibiotic medicine by an injection or through the IV tube.  The opening from which you urinate (urethra) will be cleaned with a germ-killing solution.  The ureteroscope will be passed through your urethra into your bladder.  A salt-water solution will flow through the ureteroscope to fill your bladder. This will help the health care provider see the openings of your ureters more clearly.  Then, the ureteroscope will be passed into your ureter. ? If a growth is found, a piece of it may be removed so it can be examined under a microscope (biopsy). ? If a stone is found, it may be removed through the ureteroscope, or the stone may be broken up using a laser, shock waves, or electrical energy. ? In some cases, if the ureter is too small, a tube may be inserted that keeps the ureter open (ureteral stent). The stent may be left in place for 1 or 2 weeks to keep the ureter open, and then the ureteroscopy procedure will be performed.  The scope will be removed, and your bladder will be emptied. The procedure may vary among health care providers and  hospitals. What happens after the procedure?  Your blood pressure, heart rate, breathing rate, and blood oxygen level will be monitored until the medicines you were given have worn off.  You may be asked to urinate.  Donot drive for 24 hours if you were given a sedative. This information is not intended to replace advice given to you by your health care provider. Make sure you discuss any questions you have with your health care provider. Document Released: 06/28/2013 Document Revised: 04/08/2016 Document Reviewed: 04/04/2016 Elsevier Interactive Patient Education  2018 [employer].  Ureteroscopy, Care After This sheet gives you information about how to care for yourself after your procedure. Your health care provider may also give you more specific instructions. If you have problems or questions, contact your health care provider. What can I expect after the procedure? After the procedure, it is common to have:  A burning sensation when you urinate.  Blood in your urine.  Mild discomfort in the bladder area or kidney area when urinating.  Needing to urinate more often or urgently.  Follow these instructions at home: Medicines  Take over-the-counter and prescription medicines only as told by your health care provider.  If you were prescribed an antibiotic medicine, take it as told by your health care provider. Do not stop taking the antibiotic even if you start to feel better. General instructions   Donot drive for 24 hours if you were given a medicine to help you relax (sedative) during your procedure.  To relieve burning, try taking a warm bath or holding a warm washcloth over your groin.  Drink enough fluid to keep your urine clear or pale yellow. ? Drink two 8-ounce glasses of water every hour for the first 2 hours after you get home. ? Continue to drink water often at home.  You can eat what you usually do.  Keep all follow-up visits as told by your health care  provider. This is important. ? If you had a tube placed to keep urine flowing (ureteral stent), ask your health care provider when you need to return to have it removed. Contact a health care provider if:  You have chills or a fever.  You have burning pain for longer than 24 hours after the procedure.  You have blood in your urine for longer than 24 hours after the procedure. Get help right away if:  You have large amounts of blood in your urine.  You have blood clots in your urine.  You have very bad pain.  You have chest pain or trouble breathing.  You are unable to urinate and you have the feeling of a full bladder. This information is not intended to replace advice given to you by your health care provider. Make sure you discuss any questions you have with your health care provider. Document Released: 06/28/2013 Document Revised: 04/08/2016 Document Reviewed: 04/04/2016 Elsevier Interactive Patient Education  [employer].  Cystoscopy Cystoscopy is a procedure that is used to help diagnose and sometimes treat conditions that affect that lower urinary tract. The lower urinary tract includes the bladder and the tube that drains urine from the bladder out of the body (urethra). Cystoscopy is performed with a thin, tube-shaped instrument with a light and camera at the end (cystoscope). The cystoscope may be hard (rigid) or flexible, depending on the goal of the procedure.The cystoscope is inserted through the urethra, into the bladder. Cystoscopy may be recommended if you have:  Urinary tractinfections that keep coming back (recurring).  Blood in the urine (hematuria).  Loss of bladder control (urinary incontinence) or an overactive bladder.  Unusual cells found in a urine sample.  A blockage in the urethra.  Painful urination.  An abnormality in the bladder found during an intravenous pyelogram (IVP) or CT scan.  Cystoscopy may also be done to remove a sample of  tissue to be examined under a microscope (biopsy). Tell a health care provider about:  Any allergies you have.  All medicines you are taking, including vitamins, herbs, eye drops, creams, and over-the-counter medicines.  Any problems you or family members have had with anesthetic medicines.  Any blood disorders you have.  Any surgeries you have had.  Any medical conditions you have.  Whether you are pregnant or may be pregnant. What are the risks? Generally, this is a safe procedure. However, problems may occur, including:  Infection.  Bleeding.  Allergic reactions to medicines.  Damage to other structures or organs.  What happens before the procedure?  Ask your health care provider about: ? Changing or stopping your regular medicines. This is especially important if you are taking diabetes medicines or blood thinners. ? Taking medicines such as aspirin and ibuprofen. These medicines can thin your blood. Do not take these medicines before your procedure if your health care provider instructs you not to.  Follow instructions from your health care provider about eating or drinking restrictions.  You may be given antibiotic medicine to help prevent infection.  You may have an exam or testing, such as X-rays of the bladder, urethra, or kidneys.  You may have urine tests to check for signs of infection.  Plan to have someone take you home after the procedure. What happens during the procedure?  To reduce your risk of infection,your health care team will wash or sanitize their hands.  You will be given one or more of the following: ? A medicine to help you relax (sedative). ? A medicine to numb the area (local anesthetic).  The area around the opening of your urethra will be cleaned.  The cystoscope will be passed through your urethra into your bladder.  Germ-free (sterile)fluid will flow through the cystoscope to fill your bladder. The fluid will stretch your  bladder so that your surgeon can clearly examine your bladder walls.  The cystoscope will be removed and your bladder will be emptied. The procedure may vary among health care providers and hospitals. What happens after the procedure?  You may have some soreness or pain in your abdomen and urethra. Medicines will be available to help you.  You may have some blood in your urine.  Do not drive for 24 hours if you received a sedative. This information is not intended to replace advice given to you by your health care provider. Make sure you discuss any questions you have with your health care provider. Document Released: 06/20/2000 Document Revised: 11/01/2015 Document Reviewed: 05/10/2015 Elsevier Interactive Patient Education  2017 [place].  Cystoscopy, Care After Refer to this sheet in the next few weeks. These instructions provide you with information about caring for yourself after your procedure. Your health care provider may also give you more specific instructions. Your treatment has been planned according to current medical practices, but problems sometimes occur. Call your health care provider if you have any problems or questions after your procedure. What can I expect after the procedure? After the procedure, it is common to have:  Mild pain when you urinate. Pain should stop within a few minutes after you urinate. This may last for up to 1 week.  A small amount of blood in your urine for several days.  Feeling like you need to urinate but producing only a small amount of urine.  Follow these instructions at home:  Medicines  Take over-the-counter and prescription medicines only as told by your health care provider.  If you were prescribed an antibiotic medicine, take it as told by your health care provider. Do not stop taking the antibiotic even if you start to feel better. General instructions   Return to your normal activities as told by your health care  provider. Ask your health care provider what activities are safe for you.  Do not drive for 24 hours if you received a sedative.  Watch for any blood in your urine. If the amount of blood in your urine increases, call your health care provider.  Follow instructions from  your health care provider about eating or drinking restrictions.  If a tissue sample was removed for testing (biopsy) during your procedure, it is your responsibility to get your test results. Ask your health care provider or the department performing the test when your results will be ready.  Drink enough fluid to keep your urine clear or pale yellow.  Keep all follow-up visits as told by your health care provider. This is important. Contact a health care provider if:  You have pain that gets worse or does not get better with medicine, especially pain when you urinate.  You have difficulty urinating. Get help right away if:  You have more blood in your urine.  You have blood clots in your urine.  You have abdominal pain.  You have a fever or chills.  You are unable to urinate. This information is not intended to replace advice given to you by your health care provider. Make sure you discuss any questions you have with your health care provider. Document Released: 01/10/2005 Document Revised: 11/29/2015 Document Reviewed: 05/10/2015 Elsevier Interactive Patient Education  2017 [place] Anesthesia, Adult General anesthesia is the use of medicines to make a person "go to sleep" (be unconscious) for a medical procedure. General anesthesia is often recommended when a procedure:  Is long.  Requires you to be still or in an unusual position.  Is major and can cause you to lose blood.  Is impossible to do without general anesthesia.  The medicines used for general anesthesia are called general anesthetics. In addition to making you sleep, the medicines:  Prevent pain.  Control your blood  pressure.  Relax your muscles.  Tell a health care provider about:  Any allergies you have.  All medicines you are taking, including vitamins, herbs, eye drops, creams, and over-the-counter medicines.  Any problems you or family members have had with anesthetic medicines.  Types of anesthetics you have had in the past.  Any bleeding disorders you have.  Any surgeries you have had.  Any medical conditions you have.  Any history of heart or lung conditions, such as heart failure, sleep apnea, or chronic obstructive pulmonary disease (COPD).  Whether you are pregnant or may be pregnant.  Whether you use tobacco, alcohol, marijuana, or street drugs.  Any history of Armed forces logistics/support/administrative officer.  Any history of depression or anxiety. What are the risks? Generally, this is a safe procedure. However, problems may occur, including:  Allergic reaction to anesthetics.  Lung and heart problems.  Inhaling food or liquids from your stomach into your lungs (aspiration).  Injury to nerves.  Waking up during your procedure and being unable to move (rare).  Extreme agitation or a state of mental confusion (delirium) when you wake up from the anesthetic.  Air in the bloodstream, which can lead to stroke.  These problems are more likely to develop if you are having a major surgery or if you have an advanced medical condition. You can prevent some of these complications by answering all of your health care provider's questions thoroughly and by following all pre-procedure instructions. General anesthesia can cause side effects, including:  Nausea or vomiting  A sore throat from the breathing tube.  Feeling cold or shivery.  Feeling tired, washed out, or achy.  Sleepiness or drowsiness.  Confusion or agitation.  What happens before the procedure? Staying hydrated Follow instructions from your health care provider about hydration, which may include:  Up to 2 hours before the procedure  -  you may continue to drink clear liquids, such as water, clear fruit juice, black coffee, and plain tea.  Eating and drinking restrictions Follow instructions from your health care provider about eating and drinking, which may include:  8 hours before the procedure - stop eating heavy meals or foods such as meat, fried foods, or fatty foods.  6 hours before the procedure - stop eating light meals or foods, such as toast or cereal.  6 hours before the procedure - stop drinking milk or drinks that contain milk.  2 hours before the procedure - stop drinking clear liquids.  Medicines  Ask your health care provider about: ? Changing or stopping your regular medicines. This is especially important if you are taking diabetes medicines or blood thinners. ? Taking medicines such as aspirin and ibuprofen. These medicines can thin your blood. Do not take these medicines before your procedure if your health care provider instructs you not to. ? Taking new dietary supplements or medicines. Do not take these during the week before your procedure unless your health care provider approves them.  If you are told to take a medicine or to continue taking a medicine on the day of the procedure, take the medicine with sips of water. General instructions   Ask if you will be going home the same day, the following day, or after a longer hospital stay. ? Plan to have someone take you home. ? Plan to have someone stay with you for the first 24 hours after you leave the hospital or clinic.  For 3-6 weeks before the procedure, try not to use any tobacco products, such as cigarettes, chewing tobacco, and e-cigarettes.  You may brush your teeth on the morning of the procedure, but make sure to spit out the toothpaste. What happens during the procedure?  You will be given anesthetics through a mask and through an IV tube in one of your veins.  You may receive medicine to help you relax (sedative).  As soon  as you are asleep, a breathing tube may be used to help you breathe.  An anesthesia specialist will stay with you throughout the procedure. He or she will help keep you comfortable and safe by continuing to give you medicines and adjusting the amount of medicine that you get. He or she will also watch your blood pressure, pulse, and oxygen levels to make sure that the anesthetics do not cause any problems.  If a breathing tube was used to help you breathe, it will be removed before you wake up. The procedure may vary among health care providers and hospitals. What happens after the procedure?  You will wake up, often slowly, after the procedure is complete, usually in a recovery area.  Your blood pressure, heart rate, breathing rate, and blood oxygen level will be monitored until the medicines you were given have worn off.  You may be given medicine to help you calm down if you feel anxious or agitated.  If you will be going home the same day, your health care provider may check to make sure you can stand, drink, and urinate.  Your health care providers will treat your pain and side effects before you go home.  Do not drive for 24 hours if you received a sedative.  You may: ? Feel nauseous and vomit. ? Have a sore throat. ? Have mental slowness. ? Feel cold or shivery. ? Feel sleepy. ? Feel tired. ? Feel sore or achy, even in parts of  your body where you did not have surgery. This information is not intended to replace advice given to you by your health care provider. Make sure you discuss any questions you have with your health care provider. Document Released: 09/30/2007 Document Revised: 12/04/2015 Document Reviewed: 06/07/2015 Elsevier Interactive Patient Education  2018 [place] Anesthesia, Adult, Care After These instructions provide you with information about caring for yourself after your procedure. Your health care provider may also give you more specific  instructions. Your treatment has been planned according to current medical practices, but problems sometimes occur. Call your health care provider if you have any problems or questions after your procedure. What can I expect after the procedure? After the procedure, it is common to have:  Vomiting.  A sore throat.  Mental slowness.  It is common to feel:  Nauseous.  Cold or shivery.  Sleepy.  Tired.  Sore or achy, even in parts of your body where you did not have surgery.  Follow these instructions at home: For at least 24 hours after the procedure:  Do not: ? Participate in activities where you could fall or become injured. ? Drive. ? Use heavy machinery. ? Drink alcohol. ? Take sleeping pills or medicines that cause drowsiness. ? Make important decisions or sign legal documents. ? Take care of children on your own.  Rest. Eating and drinking  If you vomit, drink water, juice, or soup when you can drink without vomiting.  Drink enough fluid to keep your urine clear or pale yellow.  Make sure you have little or no nausea before eating solid foods.  Follow the diet recommended by your health care provider. General instructions  Have a responsible adult stay with you until you are awake and alert.  Return to your normal activities as told by your health care provider. Ask your health care provider what activities are safe for you.  Take over-the-counter and prescription medicines only as told by your health care provider.  If you smoke, do not smoke without supervision.  Keep all follow-up visits as told by your health care provider. This is important. Contact a health care provider if:  You continue to have nausea or vomiting at home, and medicines are not helpful.  You cannot drink fluids or start eating again.  You cannot urinate after 8-12 hours.  You develop a skin rash.  You have fever.  You have increasing redness at the site of your  procedure. Get help right away if:  You have difficulty breathing.  You have chest pain.  You have unexpected bleeding.  You feel that you are having a life-threatening or urgent problem. This information is not intended to replace advice given to you by your health care provider. Make sure you discuss any questions you have with your health care provider. Document Released: 09/29/2000 Document Revised: 11/26/2015 Document Reviewed: 06/07/2015 Elsevier Interactive Patient Education  [employer].

## 2017-04-24 ENCOUNTER — Encounter (HOSPITAL_COMMUNITY)
Admission: RE | Admit: 2017-04-24 | Discharge: 2017-04-24 | Disposition: A | Discharge status: Home / Self Care | Payer: Self-pay | Source: Ambulatory Visit | Attending: Urology | Admitting: Urology

## 2017-04-24 ENCOUNTER — Encounter (HOSPITAL_COMMUNITY): Payer: Self-pay

## 2017-04-30 NOTE — Patient Instructions (Signed)
Sally [name]  04/30/2017     @PREFPERIOPPHARMACY @   Your procedure is scheduled on 05/05/2017.  Report to [facility] at 12:00 P.M.  Call this number if you have problems the morning of surgery:  [phone]   Remember:  Do not eat food or drink liquids after midnight.  Take these medicines the morning of surgery with A SIP OF WATER Celexa, Valium, Skelaxin, Oxycodone OR Ultram if needed, Inderal, Flomax   Do not wear jewelry, make-up or nail polish.  Do not wear lotions, powders, or perfumes, or deoderant.  Do not shave 48 hours prior to surgery.  Men may shave face and neck.  Do not bring valuables to the hospital.  [facility] is not responsible for any belongings or valuables.  Contacts, dentures or bridgework may not be worn into surgery.  Leave your suitcase in the car.  After surgery it may be brought to your room.  For patients admitted to the hospital, discharge time will be determined by your treatment team.  Patients discharged the day of surgery will not be allowed to drive home.    Please read over the following fact sheets that you were given. Surgical Site Infection Prevention and Anesthesia Post-op Instructions     PATIENT INSTRUCTIONS POST-ANESTHESIA  IMMEDIATELY FOLLOWING SURGERY:  Do not drive or operate machinery for the first twenty four hours after surgery.  Do not make any important decisions for twenty four hours after surgery or while taking narcotic pain medications or sedatives.  If you develop intractable nausea and vomiting or a severe headache please notify your doctor immediately.  FOLLOW-UP:  Please make an appointment with your surgeon as instructed. You do not need to follow up with anesthesia unless specifically instructed to do so.  WOUND CARE INSTRUCTIONS (if applicable):  Keep a dry clean dressing on the anesthesia/puncture wound site if there is drainage.  Once the wound has quit draining you may leave it open to air.  Generally  you should leave the bandage intact for twenty four hours unless there is drainage.  If the epidural site drains for more than 36-48 hours please call the anesthesia department.  QUESTIONS?:  Please feel free to call your physician or the hospital operator if you have any questions, and they will be happy to assist you.      Cystoscopy Cystoscopy is a procedure that is used to help diagnose and sometimes treat conditions that affect that lower urinary tract. The lower urinary tract includes the bladder and the tube that drains urine from the bladder out of the body (urethra). Cystoscopy is performed with a thin, tube-shaped instrument with a light and camera at the end (cystoscope). The cystoscope may be hard (rigid) or flexible, depending on the goal of the procedure.The cystoscope is inserted through the urethra, into the bladder. Cystoscopy may be recommended if you have:  Urinary tractinfections that keep coming back (recurring).  Blood in the urine (hematuria).  Loss of bladder control (urinary incontinence) or an overactive bladder.  Unusual cells found in a urine sample.  A blockage in the urethra.  Painful urination.  An abnormality in the bladder found during an intravenous pyelogram (IVP) or CT scan.  Cystoscopy may also be done to remove a sample of tissue to be examined under a microscope (biopsy). Tell a health care provider about:  Any allergies you have.  All medicines you are taking, including vitamins, herbs, eye drops, creams, and over-the-counter medicines.  Any problems you  or family members have had with anesthetic medicines.  Any blood disorders you have.  Any surgeries you have had.  Any medical conditions you have.  Whether you are pregnant or may be pregnant. What are the risks? Generally, this is a safe procedure. However, problems may occur, including:  Infection.  Bleeding.  Allergic reactions to medicines.  Damage to other structures or  organs.  What happens before the procedure?  Ask your health care provider about: ? Changing or stopping your regular medicines. This is especially important if you are taking diabetes medicines or blood thinners. ? Taking medicines such as aspirin and ibuprofen. These medicines can thin your blood. Do not take these medicines before your procedure if your health care provider instructs you not to.  Follow instructions from your health care provider about eating or drinking restrictions.  You may be given antibiotic medicine to help prevent infection.  You may have an exam or testing, such as X-rays of the bladder, urethra, or kidneys.  You may have urine tests to check for signs of infection.  Plan to have someone take you home after the procedure. What happens during the procedure?  To reduce your risk of infection,your health care team will wash or sanitize their hands.  You will be given one or more of the following: ? A medicine to help you relax (sedative). ? A medicine to numb the area (local anesthetic).  The area around the opening of your urethra will be cleaned.  The cystoscope will be passed through your urethra into your bladder.  Germ-free (sterile)fluid will flow through the cystoscope to fill your bladder. The fluid will stretch your bladder so that your surgeon can clearly examine your bladder walls.  The cystoscope will be removed and your bladder will be emptied. The procedure may vary among health care providers and hospitals. What happens after the procedure?  You may have some soreness or pain in your abdomen and urethra. Medicines will be available to help you.  You may have some blood in your urine.  Do not drive for 24 hours if you received a sedative. This information is not intended to replace advice given to you by your health care provider. Make sure you discuss any questions you have with your health care provider. Document Released: 06/20/2000  Document Revised: 11/01/2015 Document Reviewed: 05/10/2015 Elsevier Interactive Patient Education  2017 [employer].

## 2017-05-01 ENCOUNTER — Encounter (HOSPITAL_COMMUNITY): Payer: Self-pay

## 2017-05-01 ENCOUNTER — Encounter (HOSPITAL_COMMUNITY)
Admission: RE | Admit: 2017-05-01 | Discharge: 2017-05-01 | Disposition: A | Discharge status: Home / Self Care | Payer: Self-pay | Source: Ambulatory Visit | Attending: Urology | Admitting: Urology

## 2017-05-01 ENCOUNTER — Other Ambulatory Visit: Payer: Self-pay

## 2017-05-01 DIAGNOSIS — N200 Calculus of kidney: Secondary | ICD-10-CM | POA: Insufficient documentation

## 2017-05-01 DIAGNOSIS — Z01810 Encounter for preprocedural cardiovascular examination: Secondary | ICD-10-CM | POA: Insufficient documentation

## 2017-05-01 DIAGNOSIS — Z01812 Encounter for preprocedural laboratory examination: Secondary | ICD-10-CM | POA: Insufficient documentation

## 2017-05-01 HISTORY — DX: Depression, unspecified: F32.A

## 2017-05-01 HISTORY — DX: Major depressive disorder, single episode, unspecified: F32.9

## 2017-05-01 HISTORY — DX: Anxiety disorder, unspecified: F41.9

## 2017-05-01 LAB — HCG, SERUM, QUALITATIVE: Preg, Serum: NEGATIVE

## 2017-05-01 MED ORDER — GENTAMICIN SULFATE 40 MG/ML IJ SOLN: 5.0000 mg/kg | INTRAVENOUS | Status: DC

## 2017-05-01 MED ORDER — DEXTROSE 5 % IV SOLN: 5.0000 mg/kg | Freq: Once | INTRAVENOUS | Status: DC

## 2017-05-05 ENCOUNTER — Ambulatory Visit (HOSPITAL_COMMUNITY): Payer: Self-pay | Admitting: Anesthesiology

## 2017-05-05 ENCOUNTER — Ambulatory Visit (HOSPITAL_COMMUNITY)
Admission: RE | Admit: 2017-05-05 | Discharge: 2017-05-05 | Disposition: A | Discharge status: Home / Self Care | Payer: Self-pay | Source: Ambulatory Visit | Attending: Urology | Admitting: Urology

## 2017-05-05 ENCOUNTER — Encounter (HOSPITAL_COMMUNITY)
Admission: RE | Disposition: A | Discharge status: Home / Self Care | Payer: Self-pay | Source: Ambulatory Visit | Attending: Urology

## 2017-05-05 ENCOUNTER — Encounter (HOSPITAL_COMMUNITY): Payer: Self-pay | Admitting: *Deleted

## 2017-05-05 ENCOUNTER — Ambulatory Visit (HOSPITAL_COMMUNITY): Payer: Self-pay

## 2017-05-05 DIAGNOSIS — N201 Calculus of ureter: Secondary | ICD-10-CM | POA: Insufficient documentation

## 2017-05-05 DIAGNOSIS — F419 Anxiety disorder, unspecified: Secondary | ICD-10-CM | POA: Insufficient documentation

## 2017-05-05 DIAGNOSIS — G43909 Migraine, unspecified, not intractable, without status migrainosus: Secondary | ICD-10-CM | POA: Insufficient documentation

## 2017-05-05 DIAGNOSIS — Z87442 Personal history of urinary calculi: Secondary | ICD-10-CM | POA: Insufficient documentation

## 2017-05-05 DIAGNOSIS — Z885 Allergy status to narcotic agent status: Secondary | ICD-10-CM | POA: Insufficient documentation

## 2017-05-05 DIAGNOSIS — F17210 Nicotine dependence, cigarettes, uncomplicated: Secondary | ICD-10-CM | POA: Insufficient documentation

## 2017-05-05 DIAGNOSIS — F329 Major depressive disorder, single episode, unspecified: Secondary | ICD-10-CM | POA: Insufficient documentation

## 2017-05-05 DIAGNOSIS — Z886 Allergy status to analgesic agent status: Secondary | ICD-10-CM | POA: Insufficient documentation

## 2017-05-05 HISTORY — PX: HOLMIUM LASER APPLICATION: SHX5852

## 2017-05-05 HISTORY — PX: CYSTOSCOPY W/ URETERAL STENT REMOVAL: SHX1430

## 2017-05-05 HISTORY — PX: CYSTOSCOPY WITH URETEROSCOPY, STONE BASKETRY AND STENT PLACEMENT: SHX6378

## 2017-05-05 SURGERY — CYSTOSCOPY, WITH CALCULUS MANIPULATION OR REMOVAL
Anesthesia: General | Site: Ureter | Laterality: Left

## 2017-05-05 MED ORDER — SODIUM CHLORIDE 0.9 % IJ SOLN
INTRAMUSCULAR | Status: AC
  Filled 2017-05-05: qty 10

## 2017-05-05 MED ORDER — PROPOFOL 10 MG/ML IV BOLUS
INTRAVENOUS | Status: DC | PRN
  Administered 2017-05-05: 30 mg via INTRAVENOUS
  Administered 2017-05-05: 50 mg via INTRAVENOUS
  Administered 2017-05-05: 150 mg via INTRAVENOUS

## 2017-05-05 MED ORDER — TRAMADOL HCL 50 MG PO TABS: 50.0000 mg | ORAL_TABLET | Freq: Four times a day (QID) | ORAL | 0 refills | Status: DC | PRN

## 2017-05-05 MED ORDER — PROPOFOL 10 MG/ML IV BOLUS
INTRAVENOUS | Status: AC
  Filled 2017-05-05: qty 40

## 2017-05-05 MED ORDER — HYDROCODONE-ACETAMINOPHEN 5-325 MG PO TABS
ORAL_TABLET | ORAL | Status: AC
  Filled 2017-05-05: qty 1

## 2017-05-05 MED ORDER — SODIUM CHLORIDE 0.9 % IR SOLN
Status: DC | PRN
  Administered 2017-05-05 (×2): 3000 mL

## 2017-05-05 MED ORDER — FENTANYL CITRATE (PF) 100 MCG/2ML IJ SOLN
INTRAMUSCULAR | Status: AC
  Filled 2017-05-05: qty 2

## 2017-05-05 MED ORDER — HYDROCODONE-ACETAMINOPHEN 5-325 MG PO TABS
1.0000 | ORAL_TABLET | Freq: Once | ORAL | Status: AC
  Administered 2017-05-05: 1 via ORAL

## 2017-05-05 MED ORDER — SUCCINYLCHOLINE CHLORIDE 20 MG/ML IJ SOLN
INTRAMUSCULAR | Status: AC
  Filled 2017-05-05: qty 1

## 2017-05-05 MED ORDER — MIDAZOLAM HCL 2 MG/2ML IJ SOLN
INTRAMUSCULAR | Status: AC
  Filled 2017-05-05: qty 2

## 2017-05-05 MED ORDER — ARTIFICIAL TEARS OPHTHALMIC OINT
TOPICAL_OINTMENT | OPHTHALMIC | Status: AC
  Filled 2017-05-05: qty 7

## 2017-05-05 MED ORDER — FENTANYL CITRATE (PF) 100 MCG/2ML IJ SOLN
INTRAMUSCULAR | Status: AC
Start: 2017-05-05 — End: ?
  Filled 2017-05-05: qty 2

## 2017-05-05 MED ORDER — GENTAMICIN SULFATE 40 MG/ML IJ SOLN
5.0000 mg/kg | Freq: Once | INTRAVENOUS | Status: AC
  Administered 2017-05-05: 350 mg via INTRAVENOUS
  Filled 2017-05-05: qty 8.75

## 2017-05-05 MED ORDER — ONDANSETRON HCL 4 MG/2ML IJ SOLN
INTRAMUSCULAR | Status: AC
  Filled 2017-05-05: qty 2

## 2017-05-05 MED ORDER — LACTATED RINGERS IV SOLN
INTRAVENOUS | Status: DC
  Administered 2017-05-05: 12:00:00 via INTRAVENOUS

## 2017-05-05 MED ORDER — MIDAZOLAM HCL 2 MG/2ML IJ SOLN
1.0000 mg | INTRAMUSCULAR | Status: AC
  Administered 2017-05-05: 2 mg via INTRAVENOUS

## 2017-05-05 MED ORDER — FENTANYL CITRATE (PF) 100 MCG/2ML IJ SOLN
25.0000 ug | INTRAMUSCULAR | Status: DC | PRN
  Administered 2017-05-05 (×2): 25 ug via INTRAVENOUS

## 2017-05-05 MED ORDER — LIDOCAINE HCL (PF) 1 % IJ SOLN
INTRAMUSCULAR | Status: AC
  Filled 2017-05-05: qty 5

## 2017-05-05 MED ORDER — EPHEDRINE SULFATE 50 MG/ML IJ SOLN
INTRAMUSCULAR | Status: AC
  Filled 2017-05-05: qty 1

## 2017-05-05 MED ORDER — STERILE WATER FOR IRRIGATION IR SOLN
Status: DC | PRN
  Administered 2017-05-05: 1000 mL

## 2017-05-05 MED ORDER — LIDOCAINE HCL (CARDIAC) 20 MG/ML IV SOLN
INTRAVENOUS | Status: DC | PRN
  Administered 2017-05-05: 30 mg via INTRAVENOUS

## 2017-05-05 MED ORDER — CIPROFLOXACIN HCL 500 MG PO TABS: 500.0000 mg | ORAL_TABLET | Freq: Two times a day (BID) | ORAL | 0 refills | Status: AC

## 2017-05-05 MED ORDER — ONDANSETRON HCL 4 MG/2ML IJ SOLN
4.0000 mg | Freq: Once | INTRAMUSCULAR | Status: AC
  Administered 2017-05-05: 4 mg via INTRAVENOUS

## 2017-05-05 MED ORDER — FENTANYL CITRATE (PF) 100 MCG/2ML IJ SOLN
INTRAMUSCULAR | Status: DC | PRN
  Administered 2017-05-05 (×2): 50 ug via INTRAVENOUS

## 2017-05-05 MED ORDER — IOTHALAMATE MEGLUMINE 17.2 % UR SOLN
URETHRAL | Status: DC | PRN
  Administered 2017-05-05: 16 mL via URETHRAL

## 2017-05-05 SURGICAL SUPPLY — 20 items
BAG DRAIN URO TABLE W/ADPT NS (DRAPE) ×3 IMPLANT
BAG DRN 8 ADPR NS SKTRN CSTL (DRAPE) ×1
CLOTH BEACON ORANGE TIMEOUT ST (SAFETY) ×3 IMPLANT
EXTRACTOR STONE NITINOL NGAGE (UROLOGICAL SUPPLIES) ×2 IMPLANT
FIBER LASER FLEXIVA 200 (UROLOGICAL SUPPLIES) ×2 IMPLANT
GLOVE BIOGEL M 8.0 STRL (GLOVE) ×3 IMPLANT
GLOVE BIOGEL PI IND STRL 6.5 (GLOVE) IMPLANT
GLOVE BIOGEL PI INDICATOR 6.5 (GLOVE) ×2
GOWN STRL REUS W/TWL LRG LVL3 (GOWN DISPOSABLE) ×3 IMPLANT
GOWN STRL REUS W/TWL XL LVL3 (GOWN DISPOSABLE) ×6 IMPLANT
GUIDEWIRE STR DUAL SENSOR (WIRE) ×2 IMPLANT
IV NS IRRIG 3000ML ARTHROMATIC (IV SOLUTION) ×4 IMPLANT
KIT ROOM TURNOVER AP CYSTO (KITS) ×3 IMPLANT
MANIFOLD NEPTUNE II (INSTRUMENTS) ×3 IMPLANT
PACK CYSTO (CUSTOM PROCEDURE TRAY) ×3 IMPLANT
PAD ARMBOARD 7.5X6 YLW CONV (MISCELLANEOUS) ×3 IMPLANT
SHEATH ACCESS URETERAL 38CM (SHEATH) ×2 IMPLANT
STENT URET 6FRX24 CONTOUR (STENTS) ×2 IMPLANT
TOWEL OR 17X26 4PK STRL BLUE (TOWEL DISPOSABLE) ×3 IMPLANT
WATER STERILE IRR 500ML POUR (IV SOLUTION) ×2 IMPLANT

## 2017-05-05 NOTE — Discharge Instructions (Signed)
1. You may see some blood in the urine and may have some burning with urination for 48-72 hours. You also may notice that you have to urinate more frequently or urgently after your procedure which is normal.  2. You should call should you develop an inability urinate, fever > 101, persistent nausea and vomiting that prevents you from eating or drinking to stay hydrated.  3. If you have a stent, you will likely urinate more frequently and urgently until the stent is removed and you may experience some discomfort/pain in the lower abdomen and flank especially when urinating. You may take pain medication prescribed to you if needed for pain. You may also intermittently have blood in the urine until the stent is removed.  Pull the string that is located within the vagina.  On Thursday morning.  That will remove the stent. 4. If you have a catheter, you will be taught how to take care of the catheter by the nursing staff prior to discharge from the hospital.  You may periodically feel a strong urge to void with the catheter in place.  This is a bladder spasm and most often can occur when having a bowel movement or moving around. It is typically self-limited and usually will stop after a few minutes.  You may use some Vaseline or Neosporin around the tip of the catheter to reduce friction at the tip of the penis. You may also see some blood in the urine.  A very small amount of blood can make the urine look quite red.  As long as the catheter is draining well, there usually is not a problem.  However, if the catheter is not draining well and is bloody, you should call the office [phone] to notify us.

## 2017-05-05 NOTE — Transfer of Care (Signed)
Immediate Anesthesia Transfer of Care Note  Patient: [name]  Procedure(s) Performed: CYSTOSCOPY WITH URETEROSCOPY, STONE BASKETRY AND STENT PLACEMENT (Left Ureter) HOLMIUM LASER APPLICATION (Left Ureter) CYSTOSCOPY WITH STENT REMOVAL (Left Ureter)  Patient Location: PACU  Anesthesia Type:General  Level of Consciousness: awake, alert , oriented and patient cooperative  Airway & Oxygen Therapy: Patient Spontanous Breathing  Post-op Assessment: Report given to RN and Post -op Vital signs reviewed and stable  Post vital signs: Reviewed and stable  Last Vitals:  Vitals:   05/05/17 1246 05/05/17 1403  BP: (!) (P) 87/47   Resp: 16   Temp:  (P) 36.7 C  SpO2: 93%     Last Pain:  Vitals:   05/05/17 1212  TempSrc: Oral      Patients Stated Pain Goal: 3 (37/62/83 1517)  Complications: No apparent anesthesia complications

## 2017-05-05 NOTE — H&P (Signed)
H&P  Chief Complaint: Kidney stone  History of Present Illness: Sally [name] is a 36 y.o. year old female presenting for ureteroscopic mgmt of a left proximal ureteral stone. She has been stented and had a UTI treated at her original presentation. Followup culture 10/16 negative for uropathogens.  Past Medical History:  Diagnosis Date  . Anxiety   . Depression   . Migraine   . Renal disorder    kidney stone    Past Surgical History:  Procedure Laterality Date  . CESAREAN SECTION     x3  . CYSTOSCOPY W/ URETERAL STENT PLACEMENT Left 04/09/2017   Procedure: CYSTOSCOPY WITH LEFT RETROGRADE PYELOGRAM/LEFT URETERAL STENT PLACEMENT;  Surgeon: Franchot Gallo, MD;  Location: [facility];  Service: Urology;  Laterality: Left;  . TUBAL LIGATION      Home Medications:  No prescriptions prior to admission.    Allergies:  Allergies  Allergen Reactions  . Morphine And Related Itching  . Ibuprofen Itching and Rash    Family History  Problem Relation Age of Onset  . Stroke Father   . Heart failure Father   . Diabetes Father     Social History:  reports that she has been smoking Cigarettes.  She has a 10.00 pack-year smoking history. She has never used smokeless tobacco. She reports that she drinks alcohol. She reports that she does not use drugs.  ROS: A complete review of systems was performed.  All systems are negative except for pertinent findings as noted.  Physical Exam:  Vital signs in last 24 hours:   General:  Alert and oriented, No acute distress HEENT: Normocephalic, atraumatic Neck: No JVD or lymphadenopathy Cardiovascular: Regular rate and rhythm Lungs: Clear bilaterally Abdomen: Soft, nontender, nondistended, no abdominal masses Back: No CVA tenderness Extremities: No edema Neurologic: Grossly intact  Laboratory Data:  No results found for this or any previous visit (from the past 24 hour(s)). No results found for this or any previous visit (from the past  240 hour(s)). Creatinine: No results for input(s): CREATININE in the last 168 hours.  Radiologic Imaging: No results found.  Impression/Assessment:  Left proximal ureteral stone  Plan:  Cysto, left J2 stent extraction, left ureteroscopy w/ HLL, extraction of stone, possible stent replacement  Dierdre Mccalip M 05/05/2017, 11:58 AM  Lillette Boxer. Chenell Lozon MD

## 2017-05-05 NOTE — Anesthesia Procedure Notes (Signed)
Procedure Name: LMA Insertion Date/Time: 05/05/2017 1:12 PM Performed by: Andree Elk, Analeia Ismael A Pre-anesthesia Checklist: Timeout performed, Patient identified, Emergency Drugs available, Suction available and Patient being monitored Patient Re-evaluated:Patient Re-evaluated prior to induction Oxygen Delivery Method: Circle system utilized Preoxygenation: Pre-oxygenation with 100% oxygen Induction Type: IV induction Ventilation: Mask ventilation without difficulty LMA Size: 4.0 Number of attempts: 1 Placement Confirmation: breath sounds checked- equal and bilateral and positive ETCO2 Dental Injury: Teeth and Oropharynx as per pre-operative assessment

## 2017-05-05 NOTE — Anesthesia Postprocedure Evaluation (Signed)
Anesthesia Post Note  Patient: [name]  Procedure(s) Performed: CYSTOSCOPY WITH URETEROSCOPY, STONE BASKETRY AND STENT PLACEMENT (Left Ureter) HOLMIUM LASER APPLICATION (Left Ureter) CYSTOSCOPY WITH STENT REMOVAL (Left Ureter)  Patient location during evaluation: PACU Anesthesia Type: General Level of consciousness: awake and alert, oriented and patient cooperative Pain management: pain level controlled Vital Signs Assessment: post-procedure vital signs reviewed and stable Respiratory status: spontaneous breathing Cardiovascular status: stable Postop Assessment: no apparent nausea or vomiting Anesthetic complications: no     Last Vitals:  Vitals:   05/05/17 1403 05/05/17 1415  BP: 110/90 116/65  Pulse: 80 85  Resp: 15 10  Temp: 36.7 C   SpO2: 97% 98%    Last Pain:  Vitals:   05/05/17 1428  TempSrc:   PainSc: 5                  ADAMS, AMY A

## 2017-05-05 NOTE — Op Note (Signed)
Preoperative diagnosis: Left upper ureteral calculus.  Postoperative diagnosis: Same.  Principal procedure: Cystoscopy, left double-J stent extraction, left retrograde ureteropyelogram with fluoroscopic interpretation, left ureteroscopy, holmium laser lithotripsy and extraction of left upper ureteral stone, placement of 6 French by 24 centimeter contour double-J stent with tether.  Surgeon: Diona Fanti  Anesthesia: Gen. with LMA.  Complications: None.  Specimen: Stone fragments.  Estimated blood loss: Less than 5 mL  Indications: 36 year old recently presenting with an infected, obstructing left upper ureteral stone.  She underwent urgent stent placement and antibiotic management.  She presents at this time for definitive ureteroscopic management of this left upper ureteral stone.  The procedure of ureteroscopy, possible holmium laser lithotripsy and extraction of stone has been discussed with the patient on more than one occasion.  She understands the procedure as well as risks and complications.  She desires to proceed.  Findings: Normal bladder with the exception of stent present at left ureteral orifice.  Retrograde ureteropyelogram revealed a normal ureter to about the mid and distal segments, but there was an obstructing stone in the left upper ureter.  Pyelocalyceal system revealed mild pyelocaliectasis without other abnormality.  Description of procedure: The patient was properly marked and identified in the holding area.  She was then taken to the operating room where general anesthetic was administered.  Preoperative IV antibiotics were also administered.  She was placed in the dorsolithotomy position.  Genitalia and perineum were prepped and draped.  Proper timeout was performed.  A 21 French panendoscope was advanced into the bladder, which was inspected circumferentially.  The stent was grasped and removed.  I then used a 6 [place] open-ended catheter to perform a retrograde  ureteropyelogram with the above-mentioned findings.  Following this, a 0.038 inch sensor tip guidewire was advanced through the open-ended catheter with a good curl seen in the upper pole calyx fluoroscopically.  Once it was advanced.  The scope and open-ended catheter were then removed.  I then passed a 6 French semirigid ureteroscope up to the stone which was located in the upper ureter near the UPJ.  Unfortunately, pressure from the irrigation push the stone into the renal pelvis.  I then removed the semirigid ureteroscope and over the top of the guidewire placed, first at 19 French core and then the entire 12/14 [place] ureteral access catheter into the ureter.  I then passed the flexible dual lumen ureteroscope up into the renal pelvis where the stone was identified and grasped.  It could not be brought through the access catheter.  Using a 200 micron fiber and the laser energy set at 15 hertz and 0.5 joules, the stone was fragmented into 3 smaller fragments which were then easily grasped and extracted through the access catheter.  The entire pyelocalyceal system was then inspected.  No further stones were seen.  At this point, the scope was removed.  The guidewire was advanced through the access catheter, and the guidewire then backloaded through the scope.  A 6 French by 24 centimeter contour double-J stent was then deployed in the ureter with excellent proximal and distal curl seen using fluoroscopic and cystoscopic inspection.  The bladder was drained.  The thread was left on the end of the stent, brought through the urethra, tied in a knot and then tucked up inside the vagina after it was cut to length.  The procedure was then completed.  The patient was awakened and taken to the PACU in stable condition.  She tolerated the procedure well.

## 2017-05-05 NOTE — Anesthesia Preprocedure Evaluation (Signed)
Anesthesia Evaluation  Patient identified by MRN, date of birth, ID band Patient awake    Reviewed: Allergy & Precautions, NPO status , Patient's Chart, lab work & pertinent test results  Airway Mallampati: I  TM Distance: >3 FB Neck ROM: Full    Dental  (+) Teeth Intact   Pulmonary Current Smoker,    breath sounds clear to auscultation       Cardiovascular negative cardio ROS   Rhythm:Regular Rate:Normal     Neuro/Psych  Headaches, PSYCHIATRIC DISORDERS Anxiety Depression    GI/Hepatic negative GI ROS, Neg liver ROS,   Endo/Other  negative endocrine ROS  Renal/GU Renal disease     Musculoskeletal   Abdominal   Peds  Hematology negative hematology ROS (+)   Anesthesia Other Findings   Reproductive/Obstetrics                             Anesthesia Physical Anesthesia Plan  ASA: II  Anesthesia Plan: General   Post-op Pain Management:    Induction: Intravenous  PONV Risk Score and Plan:   Airway Management Planned: LMA  Additional Equipment:   Intra-op Plan:   Post-operative Plan: Extubation in OR  Informed Consent: I have reviewed the patients History and Physical, chart, labs and discussed the procedure including the risks, benefits and alternatives for the proposed anesthesia with the patient or authorized representative who has indicated his/her understanding and acceptance.     Plan Discussed with:   Anesthesia Plan Comments:         Anesthesia Quick Evaluation

## 2017-05-06 ENCOUNTER — Encounter (HOSPITAL_COMMUNITY): Payer: Self-pay | Admitting: Urology

## 2017-06-09 ENCOUNTER — Ambulatory Visit: Payer: Self-pay | Admitting: Urology

## 2017-10-13 ENCOUNTER — Other Ambulatory Visit: Payer: Self-pay

## 2017-10-13 ENCOUNTER — Encounter (HOSPITAL_COMMUNITY): Payer: Self-pay | Admitting: *Deleted

## 2017-10-13 ENCOUNTER — Emergency Department (HOSPITAL_COMMUNITY)
Admission: EM | Admit: 2017-10-13 | Discharge: 2017-10-13 | Disposition: A | Discharge status: Home / Self Care | Payer: Self-pay | Attending: Emergency Medicine | Admitting: Emergency Medicine

## 2017-10-13 DIAGNOSIS — Z79899 Other long term (current) drug therapy: Secondary | ICD-10-CM | POA: Insufficient documentation

## 2017-10-13 DIAGNOSIS — F17210 Nicotine dependence, cigarettes, uncomplicated: Secondary | ICD-10-CM | POA: Insufficient documentation

## 2017-10-13 DIAGNOSIS — L739 Follicular disorder, unspecified: Secondary | ICD-10-CM | POA: Insufficient documentation

## 2017-10-13 HISTORY — DX: Panic disorder (episodic paroxysmal anxiety): F41.0

## 2017-10-13 MED ORDER — HYDROCODONE-ACETAMINOPHEN 5-325 MG PO TABS: ORAL_TABLET | ORAL | 0 refills | Status: DC

## 2017-10-13 MED ORDER — SULFAMETHOXAZOLE-TRIMETHOPRIM 800-160 MG PO TABS: 1.0000 | ORAL_TABLET | Freq: Two times a day (BID) | ORAL | 0 refills | Status: AC

## 2017-10-13 MED ORDER — HYDROCODONE-ACETAMINOPHEN 5-325 MG PO TABS
1.0000 | ORAL_TABLET | Freq: Once | ORAL | Status: AC
  Administered 2017-10-13: 1 via ORAL
  Filled 2017-10-13: qty 1

## 2017-10-13 MED ORDER — SULFAMETHOXAZOLE-TRIMETHOPRIM 800-160 MG PO TABS
1.0000 | ORAL_TABLET | Freq: Once | ORAL | Status: AC
  Administered 2017-10-13: 1 via ORAL
  Filled 2017-10-13: qty 1

## 2017-10-13 NOTE — ED Triage Notes (Signed)
Pt c/o some type of insect or spider bite to left labia that happened yesterday. Pt's left labia is swollen and painful.

## 2017-10-13 NOTE — Discharge Instructions (Addendum)
Avoid squeezing or sticking pins to the area.  Apply warm wet compresses or warm water soaks at least 2-3 times a day.  Take the antibiotic as directed.  Return here in 2-3 days if the symptoms are not improving

## 2017-10-15 NOTE — ED Provider Notes (Signed)
[facility] EMERGENCY DEPARTMENT Provider Note   CSN: [id number] Arrival date & time: 10/13/17  1844     History   Chief Complaint Chief Complaint  Patient presents with  . Insect Bite    HPI Conda DONETTE MAINWARING is a 37 y.o. female.  HPI  Jacqueli KYRSTAN GOTWALT is a 37 y.o. female who presents to the Emergency Department complaining of painful area to the left labia for one day.  States that she was working in an area that had several spiders around.  Believes that she may have been bitten.  Noticed a small "bump" that she squeezed and now has noticed increased pain and swelling to the area.  She denies fever, chills, abdominal pain, vaginal discharge and itching.  No drainage   Past Medical History:  Diagnosis Date  . Anxiety   . Depression   . Migraine   . Panic attacks   . Renal disorder    kidney stone    Patient Active Problem List   Diagnosis Date Noted  . Sepsis secondary to UTI ([place]) 04/10/2017  . Acute pyelonephritis 04/10/2017  . Nephrolithiasis 04/09/2017  . Fever, unspecified 04/09/2017  . UTI (urinary tract infection) 04/09/2017  . Overdose 05/02/2015  . Unspecified constipation 03/31/2014  . Migraines 03/09/2014    Past Surgical History:  Procedure Laterality Date  . CESAREAN SECTION     x3  . CYSTOSCOPY W/ URETERAL STENT PLACEMENT Left 04/09/2017   Procedure: CYSTOSCOPY WITH LEFT RETROGRADE PYELOGRAM/LEFT URETERAL STENT PLACEMENT;  Surgeon: Franchot Gallo, MD;  Location: [facility];  Service: Urology;  Laterality: Left;  . CYSTOSCOPY W/ URETERAL STENT REMOVAL Left 05/05/2017   Procedure: CYSTOSCOPY WITH STENT REMOVAL;  Surgeon: Franchot Gallo, MD;  Location: [facility];  Service: Urology;  Laterality: Left;  . CYSTOSCOPY WITH URETEROSCOPY, STONE BASKETRY AND STENT PLACEMENT Left 05/05/2017   Procedure: CYSTOSCOPY WITH URETEROSCOPY, STONE BASKETRY AND STENT PLACEMENT;  Surgeon: Franchot Gallo, MD;  Location: [facility];  Service: Urology;  Laterality: Left;   75 MINS [phone] [id number]  . HOLMIUM LASER APPLICATION Left 54/27/0623   Procedure: HOLMIUM LASER APPLICATION;  Surgeon: Franchot Gallo, MD;  Location: [facility];  Service: Urology;  Laterality: Left;  . TUBAL LIGATION       OB History    Gravida  3   Para  3   Term  3   Preterm      AB      Living  3     SAB      TAB      Ectopic      Multiple      Live Births               Home Medications    Prior to Admission medications   Medication Sig Start Date End Date Taking? Authorizing Provider  acetaminophen (TYLENOL) 325 MG tablet Take 650 mg by mouth every 6 (six) hours as needed for moderate pain or headache.    [provider]  citalopram (CELEXA) 20 MG tablet Take 1 tablet (20 mg total) by mouth daily. 09/26/16   Susy Frizzle, MD  diazepam (VALIUM) 2 MG tablet Take 1 tablet (2 mg total) by mouth every 6 (six) hours as needed for anxiety. Patient taking differently: Take 2-10 mg by mouth every 6 (six) hours as needed for anxiety or muscle spasms.  07/13/15   Alycia Rossetti, MD  HYDROcodone-acetaminophen (NORCO/VICODIN) 5-325 MG tablet Take one tab po q 4 hrs prn pain 10/13/17  Ilian Wessell, PA-C  metaxalone (SKELAXIN) 800 MG tablet Take 1 tablet (800 mg total) by mouth 4 (four) times daily. Patient not taking: Reported on 04/23/2017 10/03/15   Orlena Sheldon, PA-C  Multiple Vitamin (MULTIVITAMIN WITH MINERALS) TABS tablet Take 1 tablet by mouth daily.    [provider]  propranolol ER (INDERAL LA) 60 MG 24 hr capsule Take 1 capsule (60 mg total) by mouth daily. For migraines Patient taking differently: Take 60 mg by mouth daily as needed (for migraines).  07/13/15   [place], Modena Nunnery, MD  sulfamethoxazole-trimethoprim (BACTRIM DS,SEPTRA DS) 800-160 MG tablet Take 1 tablet by mouth 2 (two) times daily for 7 days. 10/13/17 10/20/17  Oaklie Durrett, PA-C  tamsulosin (FLOMAX) 0.4 MG CAPS capsule Take 1 capsule (0.4 mg total) by  mouth daily. Patient taking differently: Take 0.4 mg by mouth 2 (two) times daily.  04/13/17   Kathie Dike, MD  traMADol (ULTRAM) 50 MG tablet Take 50 mg by mouth 2 (two) times daily as needed for moderate pain.    [provider]  traMADol (ULTRAM) 50 MG tablet Take 1 tablet (50 mg total) by mouth every 6 (six) hours as needed. 05/05/17   Franchot Gallo, MD    Family History Family History  Problem Relation Age of Onset  . Stroke Father   . Heart failure Father   . Diabetes Father     Social History Social History   Tobacco Use  . Smoking status: Current Every Day Smoker    Packs/day: 0.50    Years: 20.00    Pack years: 10.00    Types: Cigarettes  . Smokeless tobacco: Never Used  Substance Use Topics  . Alcohol use: Yes    Comment: occasional  . Drug use: No     Allergies   Morphine and related and Ibuprofen   Review of Systems Review of Systems  Constitutional: Negative for chills and fever.  Gastrointestinal: Negative for abdominal pain, nausea and vomiting.  Musculoskeletal: Negative for arthralgias and joint swelling.  Skin: Negative for color change.       Insect bite to left labia  Hematological: Negative for adenopathy.  All other systems reviewed and are negative.    Physical Exam Updated Vital Signs BP 97/72 (BP Location: Right Arm)   Pulse 68   Temp 98.2 F (36.8 C) (Oral)   Resp 16   Ht 5\' 5"  (1.651 m)   Wt 70.1 kg (154 lb 9.6 oz)   LMP 09/29/2017 (Approximate)   SpO2 96%   BMI 25.73 kg/m   Physical Exam  Constitutional: She is oriented to person, place, and time. She appears well-developed and well-nourished. No distress.  HENT:  Head: Normocephalic and atraumatic.  Cardiovascular: Normal rate, regular rhythm and normal heart sounds.  No murmur heard. Pulmonary/Chest: Effort normal and breath sounds normal. No respiratory distress.  Abdominal: Soft. She exhibits no distension. There is no tenderness. There is no  guarding.  Genitourinary:  Genitourinary Comments: < 2 cm papule to left labia majora.  No fluctuance or surrounding erythema.    Musculoskeletal: Normal range of motion.  Neurological: She is alert and oriented to person, place, and time. She exhibits normal muscle tone. Coordination normal.  Skin: Skin is warm and dry. Capillary refill takes less than 2 seconds. No erythema.  Psychiatric: She has a normal mood and affect.  Nursing note and vitals reviewed.    ED Treatments / Results  Labs (all labs ordered are listed, but only abnormal  results are displayed) Labs Reviewed - No data to display  EKG None  Radiology No results found.  Procedures Procedures (including critical care time)  Medications Ordered in ED Medications  sulfamethoxazole-trimethoprim (BACTRIM DS,SEPTRA DS) 800-160 MG per tablet 1 tablet (1 tablet Oral Given 10/13/17 2045)  HYDROcodone-acetaminophen (NORCO/VICODIN) 5-325 MG per tablet 1 tablet (1 tablet Oral Given 10/13/17 2045)     Initial Impression / Assessment and Plan / ED Course  I have reviewed the triage vital signs and the nursing notes.  Pertinent labs & imaging results that were available during my care of the patient were reviewed by me and considered in my medical decision making (see chart for details).     Pt with < 2 cm papule to the left labia majora.  Pt also has several erythematous pustules to hair follicles of the vulva.  Sx's likely folliculitis.  Area of concern felt to be indurated due to trauma from pt squeezing.  Offered pt I&D, but pt prefers to try abx, warm soaks and will return here if sx's worsen.    Final Clinical Impressions(s) / ED Diagnoses   Final diagnoses:  Folliculitis    ED Discharge Orders        Ordered    sulfamethoxazole-trimethoprim (BACTRIM DS,SEPTRA DS) 800-160 MG tablet  2 times daily     10/13/17 2034    HYDROcodone-acetaminophen (NORCO/VICODIN) 5-325 MG tablet     10/13/17 2034       Kem Parkinson, PA-C 10/15/17 2234    Orlie Dakin, MD 10/16/17 1357

## 2017-11-05 ENCOUNTER — Emergency Department (HOSPITAL_COMMUNITY)
Admission: EM | Admit: 2017-11-05 | Discharge: 2017-11-05 | Disposition: A | Discharge status: Home / Self Care | Payer: Self-pay | Attending: Emergency Medicine | Admitting: Emergency Medicine

## 2017-11-05 ENCOUNTER — Other Ambulatory Visit: Payer: Self-pay

## 2017-11-05 ENCOUNTER — Encounter (HOSPITAL_COMMUNITY): Payer: Self-pay | Admitting: Emergency Medicine

## 2017-11-05 DIAGNOSIS — Z79899 Other long term (current) drug therapy: Secondary | ICD-10-CM | POA: Insufficient documentation

## 2017-11-05 DIAGNOSIS — F17210 Nicotine dependence, cigarettes, uncomplicated: Secondary | ICD-10-CM | POA: Insufficient documentation

## 2017-11-05 DIAGNOSIS — H66002 Acute suppurative otitis media without spontaneous rupture of ear drum, left ear: Secondary | ICD-10-CM | POA: Insufficient documentation

## 2017-11-05 MED ORDER — AMOXICILLIN 500 MG PO CAPS: 500.0000 mg | ORAL_CAPSULE | Freq: Three times a day (TID) | ORAL | 0 refills | Status: AC

## 2017-11-05 MED ORDER — ANTIPYRINE-BENZOCAINE 5.4-1.4 % OT SOLN: 3.0000 [drp] | OTIC | 0 refills | Status: DC | PRN

## 2017-11-05 NOTE — ED Triage Notes (Addendum)
PT c/o left ear pain x2 days with pain upon palpation. PT also c/o congested yellow sputum cough x1 week.

## 2017-11-06 NOTE — ED Provider Notes (Signed)
[facility] EMERGENCY DEPARTMENT Provider Note   CSN: [id number] Arrival date & time: 11/05/17  1802     History   Chief Complaint Chief Complaint  Patient presents with  . Otalgia    HPI Sally [name] is a 37 y.o. female presenting with 1 week history of uri type symptoms which includes nasal congestion with yellow thick rhinorrhea, subjective fever nonproductive cough, and now development of left ear pain x 2 days. .  Symptoms do not include shortness of breath, chest pain,  Nausea, vomiting or diarrhea.  The patient has had no medications prior to arrival for her symptoms.  The history is provided by the patient.    Past Medical History:  Diagnosis Date  . Anxiety   . Depression   . Migraine   . Panic attacks   . Renal disorder    kidney stone    Patient Active Problem List   Diagnosis Date Noted  . Sepsis secondary to UTI ([place]) 04/10/2017  . Acute pyelonephritis 04/10/2017  . Nephrolithiasis 04/09/2017  . Fever, unspecified 04/09/2017  . UTI (urinary tract infection) 04/09/2017  . Overdose 05/02/2015  . Unspecified constipation 03/31/2014  . Migraines 03/09/2014    Past Surgical History:  Procedure Laterality Date  . CESAREAN SECTION     x3  . CYSTOSCOPY W/ URETERAL STENT PLACEMENT Left 04/09/2017   Procedure: CYSTOSCOPY WITH LEFT RETROGRADE PYELOGRAM/LEFT URETERAL STENT PLACEMENT;  Surgeon: Franchot Gallo, MD;  Location: [facility];  Service: Urology;  Laterality: Left;  . CYSTOSCOPY W/ URETERAL STENT REMOVAL Left 05/05/2017   Procedure: CYSTOSCOPY WITH STENT REMOVAL;  Surgeon: Franchot Gallo, MD;  Location: [facility];  Service: Urology;  Laterality: Left;  . CYSTOSCOPY WITH URETEROSCOPY, STONE BASKETRY AND STENT PLACEMENT Left 05/05/2017   Procedure: CYSTOSCOPY WITH URETEROSCOPY, STONE BASKETRY AND STENT PLACEMENT;  Surgeon: Franchot Gallo, MD;  Location: [facility];  Service: Urology;  Laterality: Left;  75 MINS [phone] [id number]  .  HOLMIUM LASER APPLICATION Left 99/83/3825   Procedure: HOLMIUM LASER APPLICATION;  Surgeon: Franchot Gallo, MD;  Location: [facility];  Service: Urology;  Laterality: Left;  . TUBAL LIGATION       OB History    Gravida  3   Para  3   Term  3   Preterm      AB      Living  3     SAB      TAB      Ectopic      Multiple      Live Births               Home Medications    Prior to Admission medications   Medication Sig Start Date End Date Taking? Authorizing Provider  acetaminophen (TYLENOL) 325 MG tablet Take 650 mg by mouth every 6 (six) hours as needed for moderate pain or headache.   Yes [provider]  citalopram (CELEXA) 20 MG tablet Take 1 tablet (20 mg total) by mouth daily. 09/26/16  Yes Susy Frizzle, MD  diazepam (VALIUM) 2 MG tablet Take 1 tablet (2 mg total) by mouth every 6 (six) hours as needed for anxiety. Patient taking differently: Take 2-10 mg by mouth every 6 (six) hours as needed for anxiety or muscle spasms.  07/13/15  Yes [place], Modena Nunnery, MD  HYDROcodone-acetaminophen (NORCO/VICODIN) 5-325 MG tablet Take one tab po q 4 hrs prn pain 10/13/17  Yes Triplett, Tammy, PA-C  Multiple Vitamin (MULTIVITAMIN WITH MINERALS) TABS tablet Take 1  tablet by mouth daily.   Yes [provider]  propranolol ER (INDERAL LA) 60 MG 24 hr capsule Take 1 capsule (60 mg total) by mouth daily. For migraines Patient taking differently: Take 60 mg by mouth daily as needed (for migraines).  07/13/15  Yes [place], Modena Nunnery, MD  traMADol (ULTRAM) 50 MG tablet Take 1 tablet (50 mg total) by mouth every 6 (six) hours as needed. 05/05/17  Yes [place], Annie Main, MD  amoxicillin (AMOXIL) 500 MG capsule Take 1 capsule (500 mg total) by mouth 3 (three) times daily for 10 days. 11/05/17 11/15/17  Evalee Jefferson, PA-C  antipyrine-benzocaine Toniann Fail) OTIC solution Place 3-4 drops into the right ear every 2 (two) hours as needed for ear pain. Compounded solution of 1%  hydrocortisone and 2% benzocaine compounded solution. 11/05/17   Evalee Jefferson, PA-C    Family History Family History  Problem Relation Age of Onset  . Stroke Father   . Heart failure Father   . Diabetes Father     Social History Social History   Tobacco Use  . Smoking status: Current Every Day Smoker    Packs/day: 0.50    Years: 20.00    Pack years: 10.00    Types: Cigarettes  . Smokeless tobacco: Never Used  Substance Use Topics  . Alcohol use: Yes    Comment: occasional  . Drug use: No     Allergies   Morphine and related and Ibuprofen   Review of Systems Review of Systems  Constitutional: Positive for fever. Negative for chills.  HENT: Positive for congestion, ear pain and rhinorrhea. Negative for sinus pressure, sore throat, trouble swallowing and voice change.   Eyes: Negative for discharge.  Respiratory: Positive for cough. Negative for shortness of breath, wheezing and stridor.   Cardiovascular: Negative for chest pain.  Gastrointestinal: Negative for abdominal pain, nausea and vomiting.  Genitourinary: Negative.      Physical Exam Updated Vital Signs BP (!) 91/58 (BP Location: Right Arm)   Pulse 72   Temp 98.1 F (36.7 C) (Oral)   Resp 19   Ht 5\' 5"  (1.651 m)   Wt 69.9 kg (154 lb)   LMP 11/02/2017   SpO2 94%   BMI 25.63 kg/m   Physical Exam  Constitutional: She is oriented to person, place, and time. She appears well-developed and well-nourished.  HENT:  Head: Normocephalic and atraumatic.  Right Ear: Tympanic membrane and ear canal normal.  Left Ear: Ear canal normal. Tympanic membrane is injected and bulging. A middle ear effusion is present.  Nose: Mucosal edema and rhinorrhea present.  Mouth/Throat: Uvula is midline, oropharynx is clear and moist and mucous membranes are normal. No oropharyngeal exudate, posterior oropharyngeal edema, posterior oropharyngeal erythema or tonsillar abscesses.  Eyes: Conjunctivae are normal.  Cardiovascular:  Normal rate and normal heart sounds.  Pulmonary/Chest: Effort normal. No respiratory distress. She has no wheezes. She has no rales.  Musculoskeletal: Normal range of motion.  Neurological: She is alert and oriented to person, place, and time.  Skin: Skin is warm and dry. No rash noted.  Psychiatric: She has a normal mood and affect.     ED Treatments / Results  Labs (all labs ordered are listed, but only abnormal results are displayed) Labs Reviewed - No data to display  EKG None  Radiology No results found.  Procedures Procedures (including critical care time)  Medications Ordered in ED Medications - No data to display   Initial Impression / Assessment and Plan / ED  Course  I have reviewed the triage vital signs and the nursing notes.  Pertinent labs & imaging results that were available during my care of the patient were reviewed by me and considered in my medical decision making (see chart for details).     Pt with apparent viral uri, with new sx of left otitis media. She was started on amoxil, prescribed auralgan for ear pain relief. Also discussed other home medications for sx relief including tylenol or motrin.  No respiratory distress.   Final Clinical Impressions(s) / ED Diagnoses   Final diagnoses:  Non-recurrent acute suppurative otitis media of left ear without spontaneous rupture of tympanic membrane    ED Discharge Orders        Ordered    amoxicillin (AMOXIL) 500 MG capsule  3 times daily     11/05/17 1920    antipyrine-benzocaine (AURALGAN) OTIC solution  Every 2 hours PRN     11/05/17 1920       Evalee Jefferson, PA-C 11/07/17 0147    Virgel Manifold, MD 11/07/17 1752

## 2017-12-17 ENCOUNTER — Other Ambulatory Visit: Payer: Self-pay

## 2017-12-17 ENCOUNTER — Emergency Department (HOSPITAL_COMMUNITY)
Admission: EM | Admit: 2017-12-17 | Discharge: 2017-12-17 | Disposition: A | Discharge status: Home / Self Care | Payer: Self-pay | Attending: Emergency Medicine | Admitting: Emergency Medicine

## 2017-12-17 ENCOUNTER — Encounter (HOSPITAL_COMMUNITY): Payer: Self-pay | Admitting: Emergency Medicine

## 2017-12-17 DIAGNOSIS — F17210 Nicotine dependence, cigarettes, uncomplicated: Secondary | ICD-10-CM | POA: Insufficient documentation

## 2017-12-17 DIAGNOSIS — L02411 Cutaneous abscess of right axilla: Secondary | ICD-10-CM | POA: Insufficient documentation

## 2017-12-17 DIAGNOSIS — Z79899 Other long term (current) drug therapy: Secondary | ICD-10-CM | POA: Insufficient documentation

## 2017-12-17 LAB — CBG MONITORING, ED: Glucose-Capillary: 91 mg/dL (ref 65–99)

## 2017-12-17 MED ORDER — HYDROCODONE-ACETAMINOPHEN 5-325 MG PO TABS: 1.0000 | ORAL_TABLET | Freq: Four times a day (QID) | ORAL | 0 refills | Status: AC | PRN

## 2017-12-17 MED ORDER — DIPHENHYDRAMINE HCL 25 MG PO CAPS
25.0000 mg | ORAL_CAPSULE | Freq: Once | ORAL | Status: AC
  Administered 2017-12-17: 25 mg via ORAL
  Filled 2017-12-17: qty 1

## 2017-12-17 MED ORDER — DOXYCYCLINE HYCLATE 100 MG PO CAPS: 100.0000 mg | ORAL_CAPSULE | Freq: Two times a day (BID) | ORAL | 0 refills | Status: DC

## 2017-12-17 MED ORDER — HYDROCODONE-ACETAMINOPHEN 5-325 MG PO TABS
1.0000 | ORAL_TABLET | Freq: Once | ORAL | Status: AC
  Administered 2017-12-17: 1 via ORAL
  Filled 2017-12-17: qty 1

## 2017-12-17 MED ORDER — TETANUS-DIPHTH-ACELL PERTUSSIS 5-2.5-18.5 LF-MCG/0.5 IM SUSP
0.5000 mL | Freq: Once | INTRAMUSCULAR | Status: AC
  Administered 2017-12-17: 0.5 mL via INTRAMUSCULAR
  Filled 2017-12-17: qty 0.5

## 2017-12-17 MED ORDER — LIDOCAINE-EPINEPHRINE (PF) 2 %-1:200000 IJ SOLN
20.0000 mL | Freq: Once | INTRAMUSCULAR | Status: AC
  Administered 2017-12-17: 20 mL
  Filled 2017-12-17: qty 20

## 2017-12-17 NOTE — ED Triage Notes (Signed)
Patient c/o abscess to left axillary. Per patient appeared last week and is progressively increasing in size. Denies any drainage. Unsure of any fevers.

## 2017-12-17 NOTE — ED Provider Notes (Signed)
[facility] EMERGENCY DEPARTMENT Provider Note   CSN: [id number] Arrival date & time: 12/17/17  1531     History   Chief Complaint Chief Complaint  Patient presents with  . Abscess    HPI Sally [name] is a 37 y.o. female.  Sally [name] is a 37 y.o. Female with a history of anxiety, depression, migraine, and panic attacks, presents to the emergency department for evaluation of abscess to the right axilla.  Patient reports it started as a small red bump about a week ago and has been getting progressively larger and more swollen.  Patient reports some surrounding smaller areas that have popped up over the last few days that are painful as well.  Patient has not tried anything to treat her symptoms prior to arrival.  Reports pain got so bad last night it was keeping her awake.  She denies any fevers, nausea or vomiting.  Patient denies previous history of abscesses, she does not currently have diabetes but does have family history.  Daily smoker.     Past Medical History:  Diagnosis Date  . Anxiety   . Depression   . Migraine   . Panic attacks   . Renal disorder    kidney stone    Patient Active Problem List   Diagnosis Date Noted  . Sepsis secondary to UTI ([place]) 04/10/2017  . Acute pyelonephritis 04/10/2017  . Nephrolithiasis 04/09/2017  . Fever, unspecified 04/09/2017  . UTI (urinary tract infection) 04/09/2017  . Overdose 05/02/2015  . Unspecified constipation 03/31/2014  . Migraines 03/09/2014    Past Surgical History:  Procedure Laterality Date  . CESAREAN SECTION     x3  . CYSTOSCOPY W/ URETERAL STENT PLACEMENT Left 04/09/2017   Procedure: CYSTOSCOPY WITH LEFT RETROGRADE PYELOGRAM/LEFT URETERAL STENT PLACEMENT;  Surgeon: Franchot Gallo, MD;  Location: [facility];  Service: Urology;  Laterality: Left;  . CYSTOSCOPY W/ URETERAL STENT REMOVAL Left 05/05/2017   Procedure: CYSTOSCOPY WITH STENT REMOVAL;  Surgeon: Franchot Gallo, MD;  Location: [facility];   Service: Urology;  Laterality: Left;  . CYSTOSCOPY WITH URETEROSCOPY, STONE BASKETRY AND STENT PLACEMENT Left 05/05/2017   Procedure: CYSTOSCOPY WITH URETEROSCOPY, STONE BASKETRY AND STENT PLACEMENT;  Surgeon: Franchot Gallo, MD;  Location: [facility];  Service: Urology;  Laterality: Left;  75 MINS [phone] [id number]  . HOLMIUM LASER APPLICATION Left 07/15/3233   Procedure: HOLMIUM LASER APPLICATION;  Surgeon: Franchot Gallo, MD;  Location: [facility];  Service: Urology;  Laterality: Left;  . TUBAL LIGATION       OB History    Gravida  3   Para  3   Term  3   Preterm      AB      Living  3     SAB      TAB      Ectopic      Multiple      Live Births               Home Medications    Prior to Admission medications   Medication Sig Start Date End Date Taking? Authorizing Provider  acetaminophen (TYLENOL) 325 MG tablet Take 650 mg by mouth every 6 (six) hours as needed for moderate pain or headache.    [provider]  antipyrine-benzocaine Toniann Fail) OTIC solution Place 3-4 drops into the right ear every 2 (two) hours as needed for ear pain. Compounded solution of 1% hydrocortisone and 2% benzocaine compounded solution. 11/05/17   Evalee Jefferson, PA-C  citalopram (CELEXA) 20 MG tablet Take 1 tablet (20 mg total) by mouth daily. 09/26/16   Susy Frizzle, MD  diazepam (VALIUM) 2 MG tablet Take 1 tablet (2 mg total) by mouth every 6 (six) hours as needed for anxiety. Patient taking differently: Take 2-10 mg by mouth every 6 (six) hours as needed for anxiety or muscle spasms.  07/13/15   Alycia Rossetti, MD  HYDROcodone-acetaminophen (NORCO/VICODIN) 5-325 MG tablet Take one tab po q 4 hrs prn pain 10/13/17   Triplett, Tammy, PA-C  Multiple Vitamin (MULTIVITAMIN WITH MINERALS) TABS tablet Take 1 tablet by mouth daily.    [provider]  propranolol ER (INDERAL LA) 60 MG 24 hr capsule Take 1 capsule (60 mg total) by mouth daily. For  migraines Patient taking differently: Take 60 mg by mouth daily as needed (for migraines).  07/13/15   [place], Modena Nunnery, MD  traMADol (ULTRAM) 50 MG tablet Take 1 tablet (50 mg total) by mouth every 6 (six) hours as needed. 05/05/17   Franchot Gallo, MD    Family History Family History  Problem Relation Age of Onset  . Stroke Father   . Heart failure Father   . Diabetes Father     Social History Social History   Tobacco Use  . Smoking status: Current Every Day Smoker    Packs/day: 0.50    Years: 20.00    Pack years: 10.00    Types: Cigarettes  . Smokeless tobacco: Never Used  Substance Use Topics  . Alcohol use: Yes    Comment: occasional  . Drug use: No     Allergies   Morphine and related and Ibuprofen   Review of Systems Review of Systems  Constitutional: Negative for chills and fever.  Gastrointestinal: Negative for nausea and vomiting.  Skin:       Abscess     Physical Exam Updated Vital Signs BP 111/84 (BP Location: Left Arm)   Pulse 98   Temp 99 F (37.2 C) (Oral)   Resp 14   Ht 5\' 5"  (1.651 m)   Wt 65.8 kg (145 lb)   LMP 12/14/2017   SpO2 98%   BMI 24.13 kg/m   Physical Exam  Constitutional: She appears well-developed and well-nourished. No distress.  HENT:  Head: Normocephalic and atraumatic.  Eyes: Right eye exhibits no discharge. Left eye exhibits no discharge.  Pulmonary/Chest: Effort normal. No respiratory distress.  Musculoskeletal:  2 x 2 centimeter area of erythema and fluctuance with surrounding induration in the left axilla, no expressible drainage, there are 3 smaller areas of induration with small whiteheads present, but no drainage is able to be expressed.  No lymphadenopathy noted, there is some surrounding induration and cellulitis, but no lymphangitic streaking.  Neurological: She is alert. Coordination normal.  Skin: Skin is warm and dry. She is not diaphoretic.  Psychiatric: She has a normal mood and affect. Her behavior  is normal.  Nursing note and vitals reviewed.    ED Treatments / Results  Labs (all labs ordered are listed, but only abnormal results are displayed) Labs Reviewed  CBG MONITORING, ED    EKG None  Radiology No results found.  Procedures .Marland KitchenIncision and Drainage Date/Time: 12/17/2017 5:42 PM Performed by: Jacqlyn Larsen, PA-C Authorized by: Jacqlyn Larsen, PA-C   Consent:    Consent obtained:  Verbal   Risks discussed:  Bleeding, incomplete drainage, infection and pain   Alternatives discussed:  No treatment Location:    Type:  Abscess  Size:  2 cm   Location:  Upper extremity   Upper extremity location: R axilla. Pre-procedure details:    Skin preparation:  Betadine Anesthesia (see MAR for exact dosages):    Anesthesia method:  Local infiltration   Local anesthetic:  Lidocaine 2% WITH epi Procedure type:    Complexity:  Simple Procedure details:    Incision types:  Single straight   Incision depth:  Dermal   Scalpel blade:  15   Wound management:  Probed and deloculated and irrigated with saline   Drainage:  Purulent and bloody   Drainage amount:  Copious   Wound treatment:  Wound left open   Packing materials:  None Post-procedure details:    Patient tolerance of procedure:  Tolerated well, no immediate complications   (including critical care time)  Medications Ordered in ED Medications  HYDROcodone-acetaminophen (NORCO/VICODIN) 5-325 MG per tablet 1 tablet (1 tablet Oral Given 12/17/17 1641)  lidocaine-EPINEPHrine (XYLOCAINE W/EPI) 2 %-1:200000 (PF) injection 20 mL (20 mLs Infiltration Given by Other 12/17/17 1643)  Tdap (BOOSTRIX) injection 0.5 mL (0.5 mLs Intramuscular Given 12/17/17 1642)  diphenhydrAMINE (BENADRYL) capsule 25 mg (25 mg Oral Given 12/17/17 1641)     Initial Impression / Assessment and Plan / ED Course  I have reviewed the triage vital signs and the nursing notes.  Pertinent labs & imaging results that were available during my care of  the patient were reviewed by me and considered in my medical decision making (see chart for details).  Patient with skin abscess amenable to incision and drainage.  Abscess was not large enough to warrant packing or drain,  wound recheck in 2 days. Encouraged home warm soaks and flushing.  Mild signs of cellulitis is surrounding skin, smaller abscesses will put on course of doxycycline.  CBG normal.  Tetanus updated here in the emergency department.  Return precautions discussed.  Patient expresses understanding and is in agreement with plan.    Final Clinical Impressions(s) / ED Diagnoses   Final diagnoses:  Abscess of axilla, right    ED Discharge Orders        Ordered    doxycycline (VIBRAMYCIN) 100 MG capsule  2 times daily     12/17/17 1745    HYDROcodone-acetaminophen (NORCO) 5-325 MG tablet  Every 6 hours PRN     12/17/17 1745       Jacqlyn Larsen, PA-C 12/17/17 1748    Daleen Bo, MD 12/17/17 2358

## 2017-12-17 NOTE — Discharge Instructions (Addendum)
Please take entire course of antibiotics as directed.  You may use Norco for severe pain in addition to Tylenol.  I encourage you to use warm compresses and soaks several times a day to help promote drainage of any remaining infection.  This should help keep these from recurring although there is always a chance.  If you are having worsening pain, redness or swelling please return in 2 days for a wound check otherwise please follow-up with your primary doctor on Monday morning.  Keep the area covered as it will likely continue to drain over the next few days.  Return sooner for fevers and chills, or worsening of abscess.

## 2018-05-18 ENCOUNTER — Other Ambulatory Visit: Payer: Self-pay

## 2018-05-18 ENCOUNTER — Emergency Department (HOSPITAL_COMMUNITY)
Admission: EM | Admit: 2018-05-18 | Discharge: 2018-05-19 | Disposition: A | Discharge status: Home / Self Care | Payer: Self-pay | Attending: Emergency Medicine | Admitting: Emergency Medicine

## 2018-05-18 ENCOUNTER — Encounter (HOSPITAL_COMMUNITY): Payer: Self-pay | Admitting: Emergency Medicine

## 2018-05-18 DIAGNOSIS — R21 Rash and other nonspecific skin eruption: Secondary | ICD-10-CM

## 2018-05-18 DIAGNOSIS — G43009 Migraine without aura, not intractable, without status migrainosus: Secondary | ICD-10-CM

## 2018-05-18 DIAGNOSIS — Z79899 Other long term (current) drug therapy: Secondary | ICD-10-CM | POA: Insufficient documentation

## 2018-05-18 DIAGNOSIS — F419 Anxiety disorder, unspecified: Secondary | ICD-10-CM | POA: Insufficient documentation

## 2018-05-18 DIAGNOSIS — G43909 Migraine, unspecified, not intractable, without status migrainosus: Secondary | ICD-10-CM | POA: Insufficient documentation

## 2018-05-18 DIAGNOSIS — F17210 Nicotine dependence, cigarettes, uncomplicated: Secondary | ICD-10-CM | POA: Insufficient documentation

## 2018-05-18 DIAGNOSIS — F329 Major depressive disorder, single episode, unspecified: Secondary | ICD-10-CM | POA: Insufficient documentation

## 2018-05-18 MED ORDER — DEXAMETHASONE SODIUM PHOSPHATE 10 MG/ML IJ SOLN
10.0000 mg | Freq: Once | INTRAMUSCULAR | Status: AC
  Administered 2018-05-18: 10 mg via INTRAVENOUS
  Filled 2018-05-18: qty 1

## 2018-05-18 MED ORDER — METOCLOPRAMIDE HCL 5 MG/ML IJ SOLN
10.0000 mg | Freq: Once | INTRAMUSCULAR | Status: AC
  Administered 2018-05-18: 10 mg via INTRAVENOUS
  Filled 2018-05-18: qty 2

## 2018-05-18 MED ORDER — SODIUM CHLORIDE 0.9 % IV BOLUS
1000.0000 mL | Freq: Once | INTRAVENOUS | Status: AC
  Administered 2018-05-19: 1000 mL via INTRAVENOUS

## 2018-05-18 MED ORDER — FAMOTIDINE IN NACL 20-0.9 MG/50ML-% IV SOLN
20.0000 mg | Freq: Once | INTRAVENOUS | Status: AC
  Administered 2018-05-19: 20 mg via INTRAVENOUS
  Filled 2018-05-18: qty 50

## 2018-05-18 MED ORDER — DIPHENHYDRAMINE HCL 50 MG/ML IJ SOLN
25.0000 mg | Freq: Once | INTRAMUSCULAR | Status: AC
Start: 2018-05-18 — End: 2018-05-18
  Administered 2018-05-18: 25 mg via INTRAVENOUS
  Filled 2018-05-18: qty 1

## 2018-05-18 NOTE — ED Triage Notes (Signed)
Pt c/o migraine, rash that started on legs and now everywhere per pt. C/o low grade fever and nausea, x 2 days.  Red raised areas noted to legs.

## 2018-05-19 MED ORDER — PREDNISONE 20 MG PO TABS: ORAL_TABLET | ORAL | 0 refills | Status: DC

## 2018-05-19 NOTE — Discharge Instructions (Signed)
Take the prednisone until gone. You can also take pepcid OTC twice a day for the next week for itching from your rash. Take benadryl as needed for itching.  Look at the information to try to help prevent migraine headaches.  Recheck as needed.

## 2018-05-19 NOTE — ED Provider Notes (Signed)
[facility] EMERGENCY DEPARTMENT Provider Note   CSN: [id number] Arrival date & time: 05/18/18  2254  Time seen 23:25 PM   History   Chief Complaint No chief complaint on file.   HPI Sally [name] is a 37 y.o. female.  HPI patient states she started noticing a rash on November 10 that started on 1 of her legs and now she has on both her legs and has also spread to her arms and her neck.  She describes it as pruritic.  She denies anything new in her environment or new products.  She states she is never had it before.  They have had a new cat for about 3 months, she denies having fleas.  Nobody else in the house is having a rash or itching.  She also complains of a headache that started yesterday morning about 3 AM.  It is located in her temples and behind her eyes.  She describes it as a pounding headache.  She has had nausea and vomiting x2.  She has phono and photosensitivity.  She has had this before.  She has a history of migraines and states she used to have 13 migraines a month but she has changed her diet and now she only has 3-5 a month.  She denies numbness in her extremities or tingling, or any visual changes.  She states she took a Percocet at 8 AM today without relief.  She states her doctor is considering having her do Botox.  PCP Susy Frizzle, MD   Past Medical History:  Diagnosis Date  . Anxiety   . Depression   . Migraine   . Panic attacks   . Renal disorder    kidney stone    Patient Active Problem List   Diagnosis Date Noted  . Sepsis secondary to UTI ([place]) 04/10/2017  . Acute pyelonephritis 04/10/2017  . Nephrolithiasis 04/09/2017  . Fever, unspecified 04/09/2017  . UTI (urinary tract infection) 04/09/2017  . Overdose 05/02/2015  . Unspecified constipation 03/31/2014  . Migraines 03/09/2014    Past Surgical History:  Procedure Laterality Date  . CESAREAN SECTION     x3  . CYSTOSCOPY W/ URETERAL STENT PLACEMENT Left 04/09/2017   Procedure:  CYSTOSCOPY WITH LEFT RETROGRADE PYELOGRAM/LEFT URETERAL STENT PLACEMENT;  Surgeon: Franchot Gallo, MD;  Location: [facility];  Service: Urology;  Laterality: Left;  . CYSTOSCOPY W/ URETERAL STENT REMOVAL Left 05/05/2017   Procedure: CYSTOSCOPY WITH STENT REMOVAL;  Surgeon: Franchot Gallo, MD;  Location: [facility];  Service: Urology;  Laterality: Left;  . CYSTOSCOPY WITH URETEROSCOPY, STONE BASKETRY AND STENT PLACEMENT Left 05/05/2017   Procedure: CYSTOSCOPY WITH URETEROSCOPY, STONE BASKETRY AND STENT PLACEMENT;  Surgeon: Franchot Gallo, MD;  Location: [facility];  Service: Urology;  Laterality: Left;  75 MINS [phone] [id number]  . HOLMIUM LASER APPLICATION Left 61/60/7371   Procedure: HOLMIUM LASER APPLICATION;  Surgeon: Franchot Gallo, MD;  Location: [facility];  Service: Urology;  Laterality: Left;  . TUBAL LIGATION       OB History    Gravida  3   Para  3   Term  3   Preterm      AB      Living  3     SAB      TAB      Ectopic      Multiple      Live Births               Home Medications  Patient takes Celexa, Valium, Inderal, Percocet, a muscle relaxer, Tylenol, and Motrin  Prior to Admission medications   Medication Sig Start Date End Date Taking? Authorizing Provider  acetaminophen (TYLENOL) 500 MG tablet Take 500-1,500 mg by mouth every 6 (six) hours as needed for mild pain, moderate pain or headache.    Yes [provider]  citalopram (CELEXA) 20 MG tablet Take 1 tablet (20 mg total) by mouth daily. 09/26/16  Yes Susy Frizzle, MD  HYDROcodone-acetaminophen (NORCO) 5-325 MG tablet Take 1 tablet by mouth every 6 (six) hours as needed. Patient taking differently: Take 1 tablet by mouth every 6 (six) hours as needed for moderate pain or severe pain.  12/17/17  Yes Jacqlyn Larsen, PA-C  Multiple Vitamin (MULTIVITAMIN WITH MINERALS) TABS tablet Take 1 tablet by mouth daily.   Yes [provider]  oxyCODONE-acetaminophen  (PERCOCET/ROXICET) 5-325 MG tablet Take 1 tablet by mouth every 4 (four) hours as needed for severe pain.   Yes [provider]  propranolol ER (INDERAL LA) 60 MG 24 hr capsule Take 1 capsule (60 mg total) by mouth daily. For migraines Patient taking differently: Take 60 mg by mouth daily as needed (for migraines).  07/13/15  Yes [place], Modena Nunnery, MD  predniSONE (DELTASONE) 20 MG tablet Take 3 po QD x 3d , then 2 po QD x 3d then 1 po QD x 3d 05/19/18   [name], MD    Family History Family History  Problem Relation Age of Onset  . Stroke Father   . Heart failure Father   . Diabetes Father     Social History Social History   Tobacco Use  . Smoking status: Current Every Day Smoker    Packs/day: 0.50    Years: 20.00    Pack years: 10.00    Types: Cigarettes  . Smokeless tobacco: Never Used  Substance Use Topics  . Alcohol use: Yes    Comment: occasional  . Drug use: No     Allergies   Morphine and related and Ibuprofen   Review of Systems Review of Systems  All other systems reviewed and are negative.    Physical Exam Updated Vital Signs BP 112/74 (BP Location: Right Arm)   Pulse (!) 103   Temp 97.6 F (36.4 C) (Oral)   Resp 16   Ht 5\' 5"  (1.651 m)   Wt 64.9 kg   LMP 05/09/2018   SpO2 94%   BMI 23.80 kg/m   Vital signs normal    Physical Exam  Constitutional: She is oriented to person, place, and time. She appears well-developed and well-nourished.  Non-toxic appearance. She does not appear ill. She appears distressed.  Appears to be uncomfortable  HENT:  Head: Normocephalic and atraumatic.  Right Ear: External ear normal.  Left Ear: External ear normal.  Nose: Nose normal. No mucosal edema or rhinorrhea.  Mouth/Throat: Oropharynx is clear and moist and mucous membranes are normal. No dental abscesses or uvula swelling.  Eyes: Pupils are equal, round, and reactive to light. Conjunctivae and EOM are normal.  Neck: Normal range of motion and  full passive range of motion without pain. Neck supple.  Cardiovascular: Normal rate, regular rhythm and normal heart sounds. Exam reveals no gallop and no friction rub.  No murmur heard. Pulmonary/Chest: Effort normal and breath sounds normal. No respiratory distress. She has no wheezes. She has no rhonchi. She has no rales. She exhibits no tenderness and no crepitus.  Abdominal: Soft. Normal appearance and  bowel sounds are normal. She exhibits no distension. There is no tenderness. There is no rebound and no guarding.  Musculoskeletal: Normal range of motion. She exhibits no edema or tenderness.  Moves all extremities well.   Neurological: She is alert and oriented to person, place, and time. She has normal strength. No cranial nerve deficit.  Skin: Skin is warm, dry and intact. No rash noted. No erythema. No pallor.  She is noted to have some scattered small red lesions on her lower legs between her knees and her ankles, there are no lesions on her feet or palms, she also has a few lesions around her wrist and around her neck.  Psychiatric: She has a normal mood and affect. Her speech is normal and behavior is normal. Her mood appears not anxious.  Nursing note and vitals reviewed.        ED Treatments / Results  Labs (all labs ordered are listed, but only abnormal results are displayed) Labs Reviewed - No data to display  EKG None  Radiology No results found.  Procedures Procedures (including critical care time)  Medications Ordered in ED Medications  sodium chloride 0.9 % bolus 1,000 mL (1,000 mLs Intravenous New Bag/Given 05/19/18 0007)  metoCLOPramide (REGLAN) injection 10 mg (10 mg Intravenous Given 05/18/18 2348)  diphenhydrAMINE (BENADRYL) injection 25 mg (25 mg Intravenous Given 05/18/18 2348)  dexamethasone (DECADRON) injection 10 mg (10 mg Intravenous Given 05/18/18 2348)  famotidine (PEPCID) IVPB 20 mg premix (20 mg Intravenous New Bag/Given 05/19/18 0007)      Initial Impression / Assessment and Plan / ED Course  I have reviewed the triage vital signs and the nursing notes.  Pertinent labs & imaging results that were available during my care of the patient were reviewed by me and considered in my medical decision making (see chart for details).    Patient was given IV fluids and migraine cocktail plus I added Pepcid for her pruritic rash.  The Decadron should help with both problems, as well as the Benadryl.  Recheck at 1:10 AM patient is sitting in the room of her eyes open and appears much more comfortable.  She states her headache is gone and her rash does not itch anymore.  She feels ready to be discharged home.  She wants a work note for today.  Final Clinical Impressions(s) / ED Diagnoses   Final diagnoses:  Migraine without aura and without status migrainosus, not intractable  Rash    ED Discharge Orders         Ordered    predniSONE (DELTASONE) 20 MG tablet     05/19/18 0117        OTC pepcid, benadryl  Plan discharge  [name], MD, Barbette Or, MD 05/19/18 [phone]

## 2018-10-03 IMAGING — CT CT RENAL STONE PROTOCOL
2 of 4 series · 16 of 46 positions shown, 18 images · non-contrast
Comparison: 06/12/2014

CLINICAL DATA: Left flank pain and vomiting for 3 days. Kidney
stone 5 years ago.

EXAM:
CT ABDOMEN AND PELVIS WITHOUT CONTRAST
TECHNIQUE: Multidetector CT imaging of the abdomen and pelvis was performed
following the standard protocol without IV contrast.

[Series 2: axial st · axial · 0.85mm/px · z∈[+1066,+1466]mm · 13 of 88 slices shown, 15 images]
[im 4/88  soft-tissue]
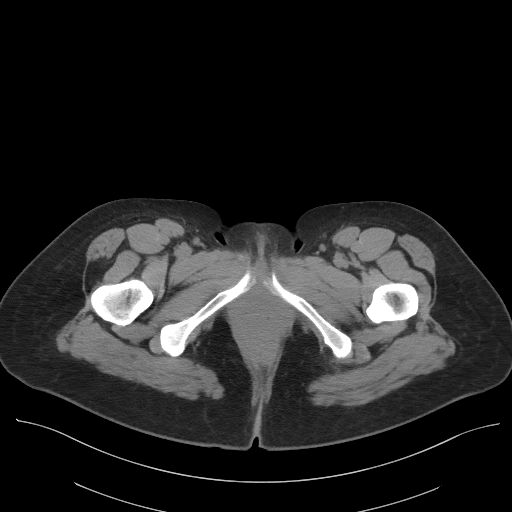
[im 4/88  bone]
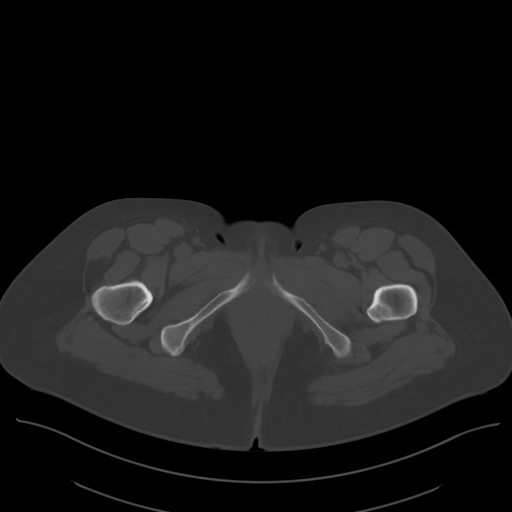
[im 11/88  soft-tissue]
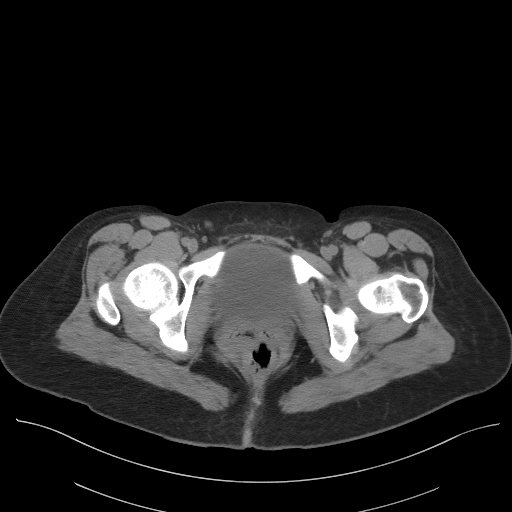
[im 17/88  soft-tissue]
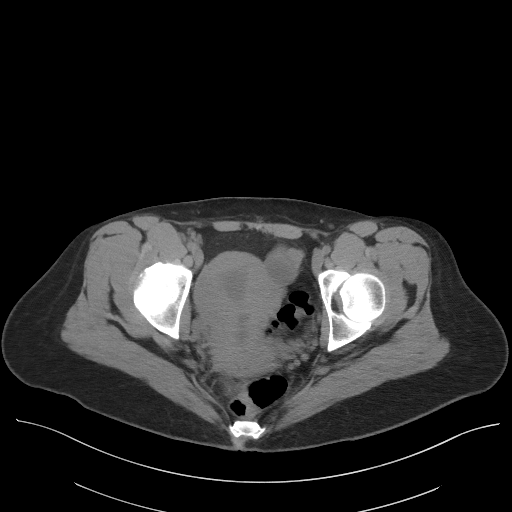
[im 24/88  soft-tissue]
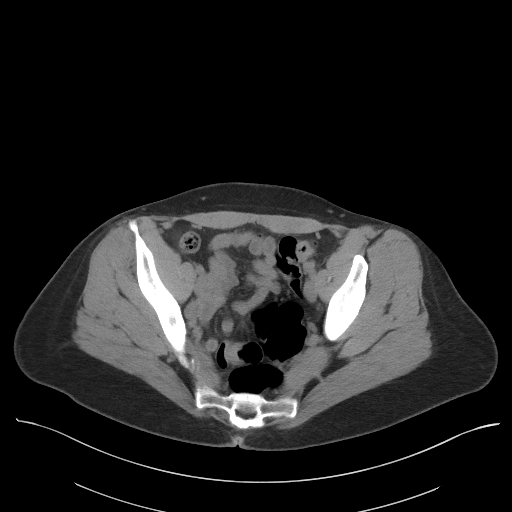
[im 31/88  soft-tissue]
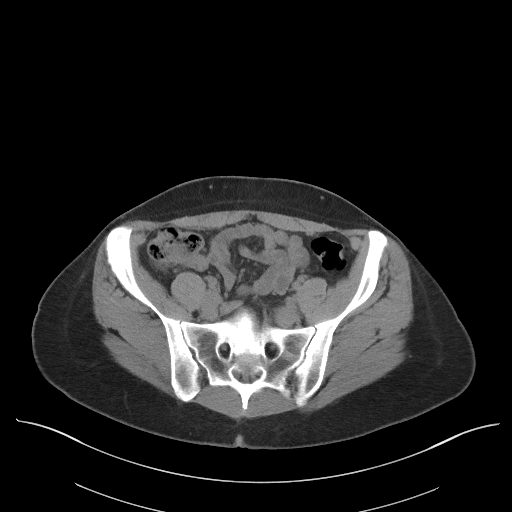
[im 37/88  soft-tissue]
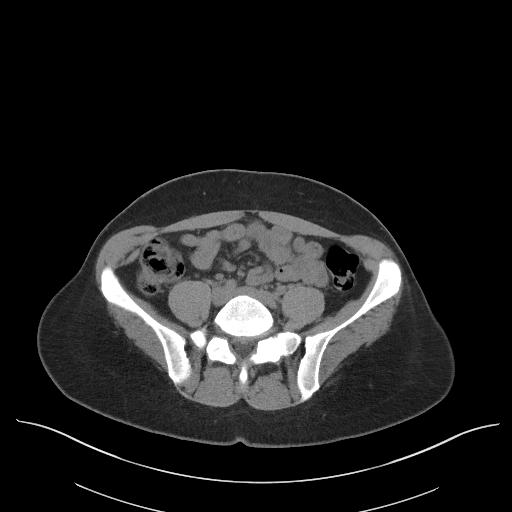
[im 44/88  soft-tissue]
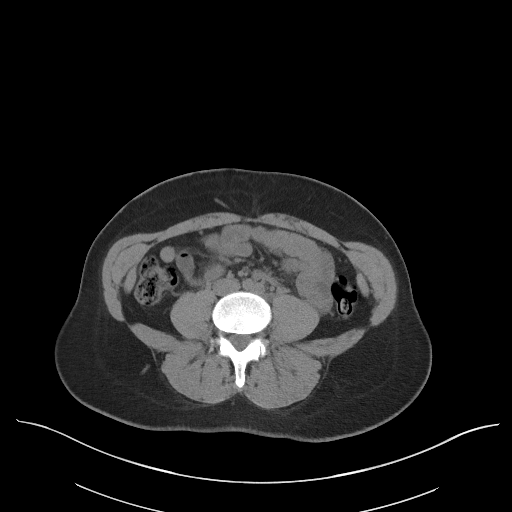
[im 51/88  soft-tissue]
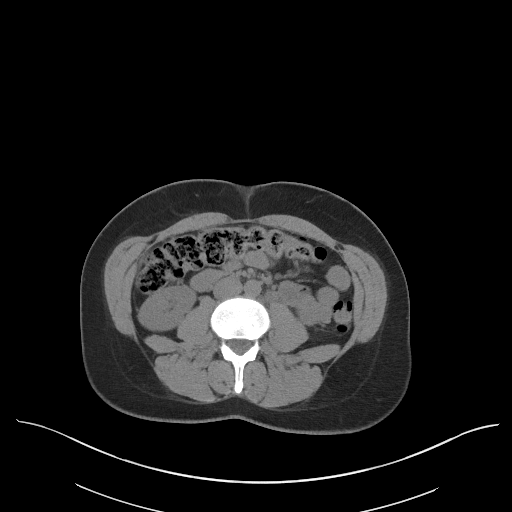
[im 57/88  soft-tissue]
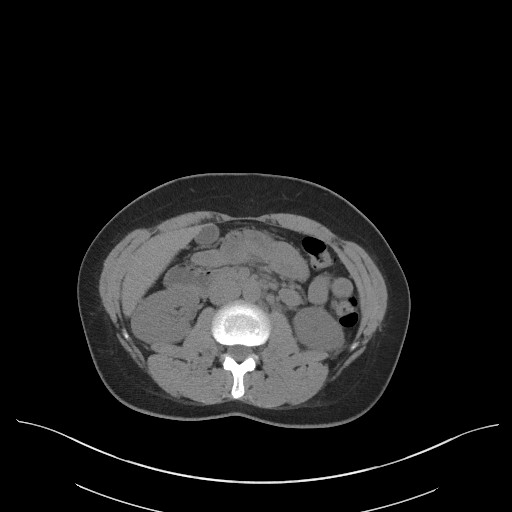
[im 57/88  bone]
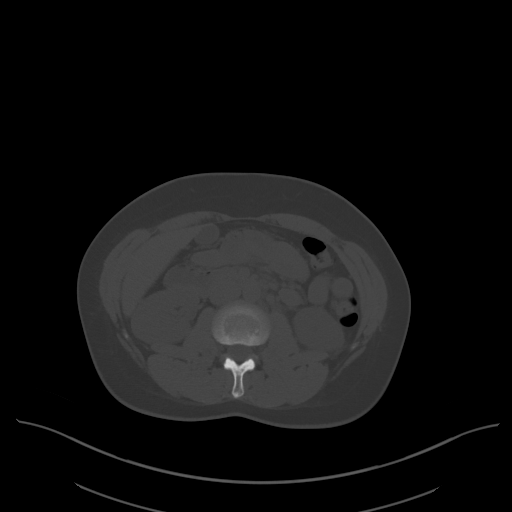
[im 64/88  soft-tissue]
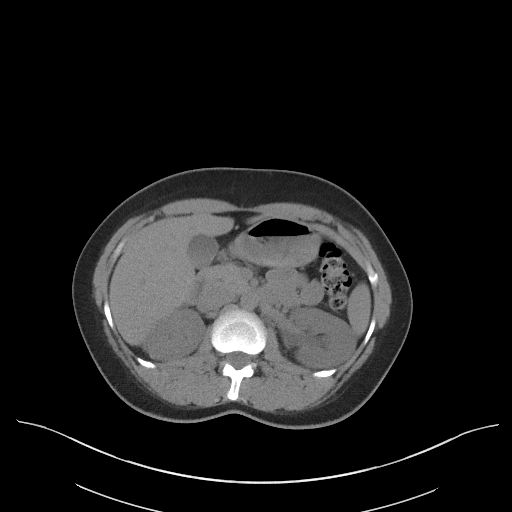
[im 71/88  soft-tissue]
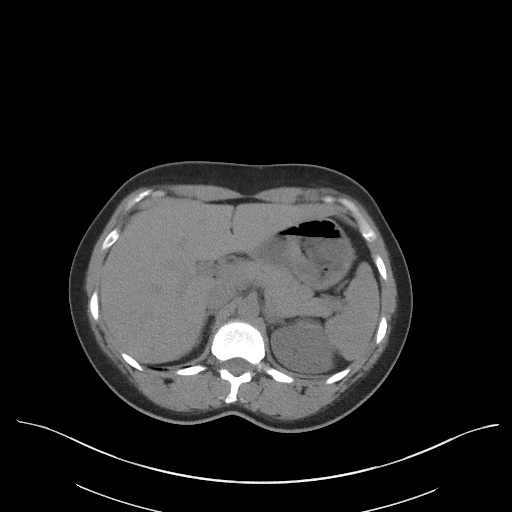
[im 77/88  soft-tissue]
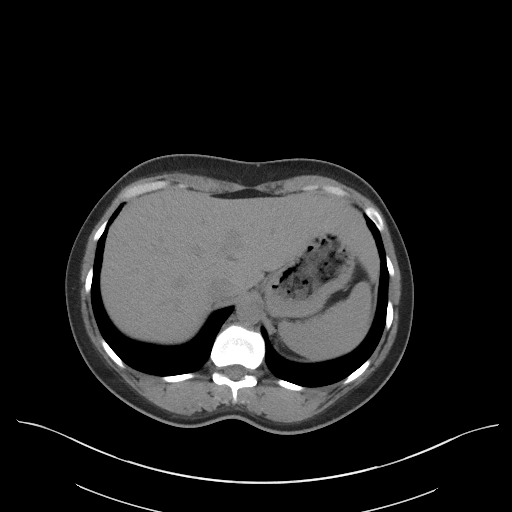
[im 84/88  soft-tissue]
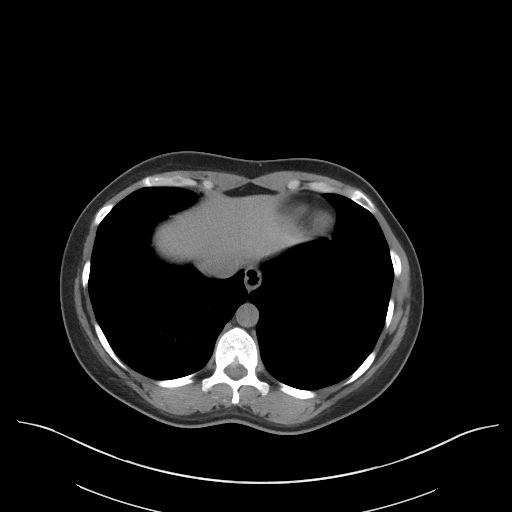

[Series 5: coronal st · coronal · 0.71mm/px · 3 of 76 slices shown]
[im 26/76  soft-tissue]
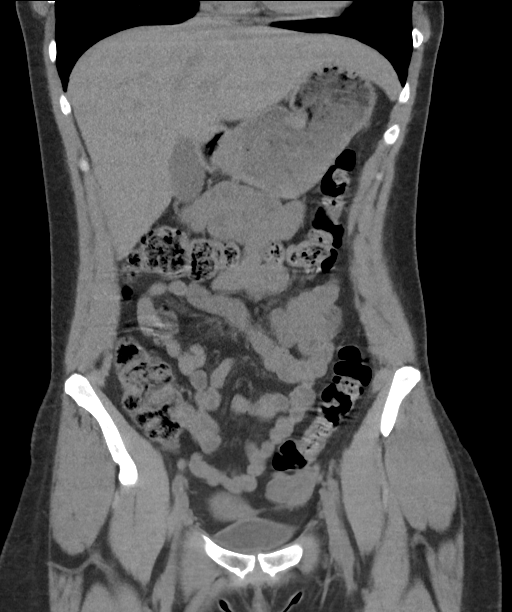
[im 34/76  soft-tissue]
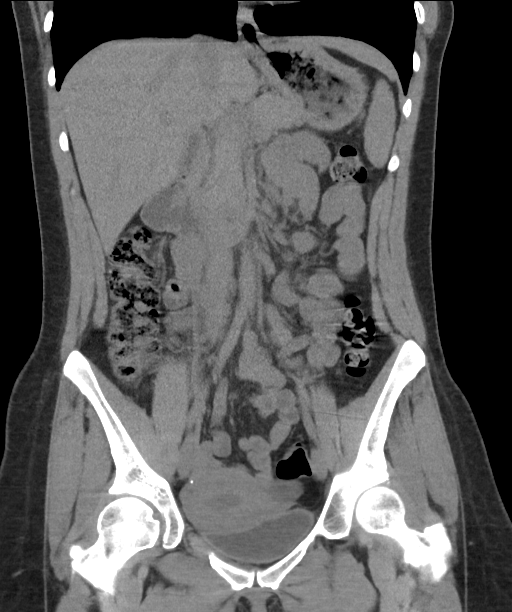
[im 42/76  soft-tissue]
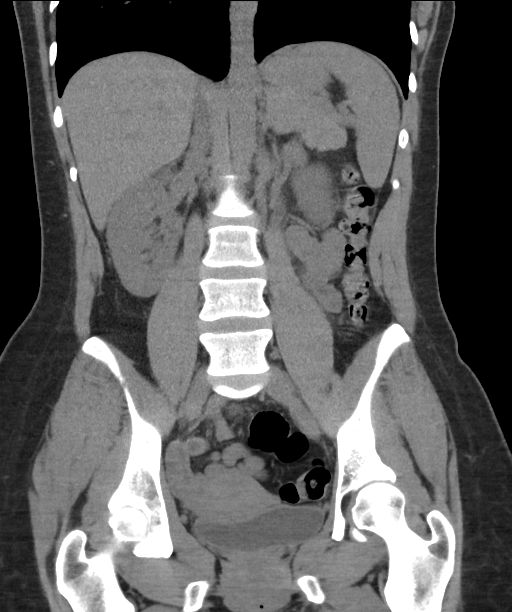

[16 of 46 positions shown; findings below may reference images not displayed]

FINDINGS: Lower chest: The lung bases are clear.

Hepatobiliary: No focal liver abnormality is seen. No gallstones,
gallbladder wall thickening, or biliary dilatation.

Pancreas: Unremarkable. No pancreatic ductal dilatation or
surrounding inflammatory changes.

Spleen: Normal in size without focal abnormality.

Adrenals/Urinary Tract: No adrenal gland nodules. 4 mm stone in the
proximal left ureter at the level of L3. Proximal left
hydronephrosis and hydroureter. Stranding around the left kidney and
ureter. Additional punctate sized intrarenal stones seen
bilaterally. No hydronephrosis or hydroureter on the right. Bladder
is unremarkable.

Stomach/Bowel: Stomach is within normal limits. Appendix appears
normal. No evidence of bowel wall thickening, distention, or
inflammatory changes.

Vascular/Lymphatic: No significant vascular findings are present. No
enlarged abdominal or pelvic lymph nodes.

Reproductive: Uterus and bilateral adnexa are unremarkable.

Other: No abdominal wall hernia or abnormality. No abdominopelvic
ascites.

Musculoskeletal: No acute or significant osseous findings.
IMPRESSION: 4 mm stone in the proximal left ureter with moderate proximal
obstruction. Additional bilateral nonobstructing intrarenal stones.

## 2019-06-17 ENCOUNTER — Other Ambulatory Visit: Payer: Self-pay

## 2019-06-17 DIAGNOSIS — Z20822 Contact with and (suspected) exposure to covid-19: Secondary | ICD-10-CM

## 2019-06-18 LAB — NOVEL CORONAVIRUS, NAA: SARS-CoV-2, NAA: NOT DETECTED

## 2020-06-09 ENCOUNTER — Encounter (HOSPITAL_COMMUNITY): Payer: Self-pay

## 2020-06-09 ENCOUNTER — Ambulatory Visit (HOSPITAL_COMMUNITY)
Admission: EM | Admit: 2020-06-09 | Discharge: 2020-06-09 | Disposition: A | Discharge status: Home / Self Care | Payer: Self-pay | Attending: Family Medicine | Admitting: Family Medicine

## 2020-06-09 DIAGNOSIS — M79671 Pain in right foot: Secondary | ICD-10-CM

## 2020-06-09 DIAGNOSIS — W57XXXA Bitten or stung by nonvenomous insect and other nonvenomous arthropods, initial encounter: Secondary | ICD-10-CM

## 2020-06-09 DIAGNOSIS — S90861A Insect bite (nonvenomous), right foot, initial encounter: Secondary | ICD-10-CM

## 2020-06-09 DIAGNOSIS — L03115 Cellulitis of right lower limb: Secondary | ICD-10-CM

## 2020-06-09 MED ORDER — CLOBETASOL PROPIONATE 0.05 % EX OINT: 1.0000 "application " | TOPICAL_OINTMENT | Freq: Two times a day (BID) | CUTANEOUS | 0 refills | Status: DC

## 2020-06-09 MED ORDER — DOXYCYCLINE HYCLATE 100 MG PO TABS: 100.0000 mg | ORAL_TABLET | Freq: Two times a day (BID) | ORAL | 0 refills | Status: AC

## 2020-06-09 NOTE — ED Triage Notes (Signed)
Pt presents with swelling and pain in the right foot. States she was bit by spider last night.

## 2020-06-10 NOTE — ED Provider Notes (Signed)
[facility]    CSN: [id number] Arrival date & time: 06/09/20  1648      History   Chief Complaint Chief Complaint  Patient presents with  . Insect Bite    HPI Sally [name] is a 39 y.o. female.   Patient presenting today with spider bite to right dorsal surface of foot that occurred last night. States the area is significantly swollen, painful to walk on, and redness spreading since incident. Pustule forming in center where bite occurred. Has not tried anything so far since incident. Denies fever, chills, active drainage from area.      Past Medical History:  Diagnosis Date  . Anxiety   . Depression   . Migraine   . Panic attacks   . Renal disorder    kidney stone    Patient Active Problem List   Diagnosis Date Noted  . Sepsis secondary to UTI ([place]) 04/10/2017  . Acute pyelonephritis 04/10/2017  . Nephrolithiasis 04/09/2017  . Fever, unspecified 04/09/2017  . UTI (urinary tract infection) 04/09/2017  . Overdose 05/02/2015  . Unspecified constipation 03/31/2014  . Migraines 03/09/2014    Past Surgical History:  Procedure Laterality Date  . CESAREAN SECTION     x3  . CYSTOSCOPY W/ URETERAL STENT PLACEMENT Left 04/09/2017   Procedure: CYSTOSCOPY WITH LEFT RETROGRADE PYELOGRAM/LEFT URETERAL STENT PLACEMENT;  Surgeon: Franchot Gallo, MD;  Location: [facility];  Service: Urology;  Laterality: Left;  . CYSTOSCOPY W/ URETERAL STENT REMOVAL Left 05/05/2017   Procedure: CYSTOSCOPY WITH STENT REMOVAL;  Surgeon: Franchot Gallo, MD;  Location: [facility];  Service: Urology;  Laterality: Left;  . CYSTOSCOPY WITH URETEROSCOPY, STONE BASKETRY AND STENT PLACEMENT Left 05/05/2017   Procedure: CYSTOSCOPY WITH URETEROSCOPY, STONE BASKETRY AND STENT PLACEMENT;  Surgeon: Franchot Gallo, MD;  Location: [facility];  Service: Urology;  Laterality: Left;  75 MINS [phone] [id number]  . HOLMIUM LASER APPLICATION Left 70/62/3762   Procedure: HOLMIUM LASER  APPLICATION;  Surgeon: Franchot Gallo, MD;  Location: [facility];  Service: Urology;  Laterality: Left;  . TUBAL LIGATION      OB History    Gravida  3   Para  3   Term  3   Preterm      AB      Living  3     SAB      TAB      Ectopic      Multiple      Live Births               Home Medications    Prior to Admission medications   Medication Sig Start Date End Date Taking? Authorizing Provider  acetaminophen (TYLENOL) 500 MG tablet Take 500-1,500 mg by mouth every 6 (six) hours as needed for mild pain, moderate pain or headache.     [provider]  citalopram (CELEXA) 20 MG tablet Take 1 tablet (20 mg total) by mouth daily. 09/26/16   Susy Frizzle, MD  clobetasol ointment (TEMOVATE) 8.31 % Apply 1 application topically 2 (two) times daily. 06/09/20   Volney American, PA-C  doxycycline (VIBRA-TABS) 100 MG tablet Take 1 tablet (100 mg total) by mouth 2 (two) times daily. 06/09/20   Volney American, PA-C  HYDROcodone-acetaminophen (NORCO) 5-325 MG tablet Take 1 tablet by mouth every 6 (six) hours as needed. Patient taking differently: Take 1 tablet by mouth every 6 (six) hours as needed for moderate pain or severe pain.  12/17/17   Benedetto Goad  N, PA-C  Multiple Vitamin (MULTIVITAMIN WITH MINERALS) TABS tablet Take 1 tablet by mouth daily.    [provider]  oxyCODONE-acetaminophen (PERCOCET/ROXICET) 5-325 MG tablet Take 1 tablet by mouth every 4 (four) hours as needed for severe pain.    [provider]  predniSONE (DELTASONE) 20 MG tablet Take 3 po QD x 3d , then 2 po QD x 3d then 1 po QD x 3d 05/19/18   [name], MD  propranolol ER (INDERAL LA) 60 MG 24 hr capsule Take 1 capsule (60 mg total) by mouth daily. For migraines Patient taking differently: Take 60 mg by mouth daily as needed (for migraines).  07/13/15   Alycia Rossetti, MD    Family History Family History  Problem Relation Age of Onset  . Stroke Father   .  Heart failure Father   . Diabetes Father     Social History Social History   Tobacco Use  . Smoking status: Current Every Day Smoker    Packs/day: 0.50    Years: 20.00    Pack years: 10.00    Types: Cigarettes  . Smokeless tobacco: Never Used  Vaping Use  . Vaping Use: Never used  Substance Use Topics  . Alcohol use: Yes    Comment: occasional  . Drug use: No     Allergies   Morphine and related and Ibuprofen   Review of Systems Review of Systems PER HPI    Physical Exam Triage Vital Signs ED Triage Vitals  Enc Vitals Group     BP 06/09/20 1735 106/66     Pulse Rate 06/09/20 1735 85     Resp 06/09/20 1735 18     Temp 06/09/20 1735 97.7 F (36.5 C)     Temp Source 06/09/20 1735 Oral     SpO2 06/09/20 1735 100 %     Weight --      Height --      Head Circumference --      Peak Flow --      Pain Score 06/09/20 1734 10     Pain Loc --      Pain Edu? --      Excl. in [place]? --    No data found.  Updated Vital Signs BP 106/66 (BP Location: Right Arm)   Pulse 85   Temp 97.7 F (36.5 C) (Oral)   Resp 18   LMP 06/03/2020 (Exact Date)   SpO2 100%   Visual Acuity Right Eye Distance:   Left Eye Distance:   Bilateral Distance:    Right Eye Near:   Left Eye Near:    Bilateral Near:     Physical Exam Vitals and nursing note reviewed.  Constitutional:      Appearance: Normal appearance. She is not ill-appearing.  HENT:     Head: Atraumatic.  Eyes:     Extraocular Movements: Extraocular movements intact.     Conjunctiva/sclera: Conjunctivae normal.  Cardiovascular:     Rate and Rhythm: Normal rate and regular rhythm.     Heart sounds: Normal heart sounds.  Pulmonary:     Effort: Pulmonary effort is normal.     Breath sounds: Normal breath sounds.  Musculoskeletal:        General: Swelling and tenderness (significant ttp right dorsal foot in area of erythema) present. Normal range of motion.     Cervical back: Normal range of motion and neck  supple.  Skin:    General: Skin is warm and dry.  Findings: Erythema present.     Comments: Right dorsal foot diffusely erythematous, edematous, ttp Pustule forming at center of erythematous area at site of bite  Neurological:     Mental Status: She is alert and oriented to person, place, and time.  Psychiatric:        Mood and Affect: Mood normal.        Thought Content: Thought content normal.        Judgment: Judgment normal.      UC Treatments / Results  Labs (all labs ordered are listed, but only abnormal results are displayed) Labs Reviewed - No data to display  EKG   Radiology No results found.  Procedures Procedures (including critical care time)  Medications Ordered in UC Medications - No data to display  Initial Impression / Assessment and Plan / UC Course  I have reviewed the triage vital signs and the nursing notes.  Pertinent labs & imaging results that were available during my care of the patient were reviewed by me and considered in my medical decision making (see chart for details).     Will tx with doxycycline, clobetasol ointment, RICE. Discussed OTC pain relievers prn, epsom salt soaks. F/u if worsening or not resolving.   Final Clinical Impressions(s) / UC Diagnoses   Final diagnoses:  Right foot pain  Cellulitis of right lower extremity  Insect bite of right foot, initial encounter   Discharge Instructions   None    ED Prescriptions    Medication Sig Dispense Auth. Provider   doxycycline (VIBRA-TABS) 100 MG tablet Take 1 tablet (100 mg total) by mouth 2 (two) times daily. 14 tablet Volney American, [place]   clobetasol ointment (TEMOVATE) 9.38 % Apply 1 application topically 2 (two) times daily. 30 g Volney American, [place]     PDMP not reviewed this encounter.   Merrie Roof [place], [place] 06/10/20 [phone]

## 2022-05-10 ENCOUNTER — Emergency Department (HOSPITAL_COMMUNITY)
Admission: EM | Admit: 2022-05-10 | Discharge: 2022-05-10 | Disposition: A | Discharge status: Home / Self Care | Payer: Self-pay | Attending: Emergency Medicine | Admitting: Emergency Medicine

## 2022-05-10 ENCOUNTER — Encounter (HOSPITAL_COMMUNITY): Payer: Self-pay

## 2022-05-10 ENCOUNTER — Other Ambulatory Visit: Payer: Self-pay

## 2022-05-10 DIAGNOSIS — L255 Unspecified contact dermatitis due to plants, except food: Secondary | ICD-10-CM | POA: Insufficient documentation

## 2022-05-10 MED ORDER — DIPHENHYDRAMINE HCL 25 MG PO CAPS
25.0000 mg | ORAL_CAPSULE | Freq: Once | ORAL | Status: AC
  Administered 2022-05-10: 25 mg via ORAL
  Filled 2022-05-10: qty 1

## 2022-05-10 MED ORDER — DEXAMETHASONE SODIUM PHOSPHATE 10 MG/ML IJ SOLN
10.0000 mg | Freq: Once | INTRAMUSCULAR | Status: AC
  Administered 2022-05-10: 10 mg via INTRAMUSCULAR
  Filled 2022-05-10: qty 1

## 2022-05-10 MED ORDER — PREDNISONE 20 MG PO TABS: 40.0000 mg | ORAL_TABLET | Freq: Every day | ORAL | 0 refills | Status: AC

## 2022-05-10 NOTE — Discharge Instructions (Addendum)
Please start the prednisone prescription tomorrow.  You may continue over-the-counter Benadryl 125 mg capsule every 6 hours as needed for itching.  Avoid hot steamy showers or baths.  You may use Aveeno oatmeal bath and calamine lotion as needed.  Follow-up with your primary care provider or return to the emergency department for any new or worsening symptoms.

## 2022-05-10 NOTE — ED Triage Notes (Signed)
Pt reports rash that has come and go over the last 2 weeks. Pt reports that the rash has spread over her whole body.   Reports burning and itching

## 2022-05-12 NOTE — ED Provider Notes (Signed)
[facility] EMERGENCY DEPARTMENT Provider Note   CSN: [id number] Arrival date & time: 05/10/22  1552     History  Chief Complaint  Patient presents with   Rash    Sally [name] is a 41 y.o. female.   Rash Associated symptoms: no fever, no joint pain, no myalgias, no nausea, no shortness of breath and not vomiting        .Sally [name] is a 41 y.o. female who presents to the Emergency Department complaining of itching burning rash to bilateral arms x2 weeks.  She states the rash has been waxing and waning in severity.  Began on her left forearm after working in the yard.  She states that rash has spread to her legs, feet and abdomen.  She denies any swelling, fever, chills.  Has taken Benadryl with some relief of the itching   Home Medications Prior to Admission medications   Medication Sig Start Date End Date Taking? Authorizing Provider  predniSONE (DELTASONE) 20 MG tablet Take 2 tablets (40 mg total) by mouth daily. 05/10/22  Yes Alexandria Shiflett, PA-C  acetaminophen (TYLENOL) 500 MG tablet Take 500-1,500 mg by mouth every 6 (six) hours as needed for mild pain, moderate pain or headache.     [provider]  citalopram (CELEXA) 20 MG tablet Take 1 tablet (20 mg total) by mouth daily. 09/26/16   Susy Frizzle, MD  doxycycline (VIBRA-TABS) 100 MG tablet Take 1 tablet (100 mg total) by mouth 2 (two) times daily. 06/09/20   Volney American, PA-C  HYDROcodone-acetaminophen (NORCO) 5-325 MG tablet Take 1 tablet by mouth every 6 (six) hours as needed. Patient taking differently: Take 1 tablet by mouth every 6 (six) hours as needed for moderate pain or severe pain.  12/17/17   Jacqlyn Larsen, PA-C  Multiple Vitamin (MULTIVITAMIN WITH MINERALS) TABS tablet Take 1 tablet by mouth daily.    [provider]  oxyCODONE-acetaminophen (PERCOCET/ROXICET) 5-325 MG tablet Take 1 tablet by mouth every 4 (four) hours as needed for severe pain.    [provider]  propranolol ER (INDERAL LA) 60 MG 24 hr capsule Take 1 capsule (60 mg total) by mouth daily. For migraines Patient taking differently: Take 60 mg by mouth daily as needed (for migraines).  07/13/15   Alycia Rossetti, MD      Allergies    Morphine and related and Ibuprofen    Review of Systems   Review of Systems  Constitutional:  Negative for chills and fever.  Respiratory:  Negative for shortness of breath.   Gastrointestinal:  Negative for nausea and vomiting.  Musculoskeletal:  Negative for arthralgias and myalgias.  Skin:  Positive for rash.  Neurological:  Negative for numbness.    Physical Exam Updated Vital Signs BP 114/88 (BP Location: Left Arm)   Pulse (!) 101   Temp 98.1 F (36.7 C) (Oral)   Resp 18   Ht '5\' 5"'$  (1.651 m)   Wt 67.1 kg   LMP 04/07/2022   SpO2 100%   BMI 24.63 kg/m  Physical Exam Vitals and nursing note reviewed.  Constitutional:      General: She is not in acute distress.    Appearance: Normal appearance.  HENT:     Mouth/Throat:     Pharynx: Oropharynx is clear.  Cardiovascular:     Rate and Rhythm: Normal rate and regular rhythm.     Pulses: Normal pulses.  Pulmonary:     Effort: Pulmonary effort is  normal.  Skin:    General: Skin is warm.     Capillary Refill: Capillary refill takes less than 2 seconds.     Findings: Rash present.     Comments: Slightly raised erythematous rash to the bilateral forearms and legs.  Few areas have linear distribution pattern.  No vesicles or pustules.  Neurological:     General: No focal deficit present.     Mental Status: She is alert.     Sensory: No sensory deficit.     Motor: No weakness.     ED Results / Procedures / Treatments   Labs (all labs ordered are listed, but only abnormal results are displayed) Labs Reviewed - No data to display  EKG None  Radiology No results found.  Procedures Procedures    Medications Ordered in ED Medications  dexamethasone (DECADRON)  injection 10 mg (10 mg Intramuscular Given 05/10/22 1720)  diphenhydrAMINE (BENADRYL) capsule 25 mg (25 mg Oral Given 05/10/22 1720)    ED Course/ Medical Decision Making/ A&P                           Medical Decision Making Patient here with rash that has been waxing and waning in severity x2 weeks.  She describes it as itching and burning, her symptoms worsen with sweating or hot showers.  She has been taking Benadryl intermittently with some relief of her itching.  Rash began after working outside.   On exam, rash appears consistent with plant dermatitis.  No pustules or vesicles to suggest zoster or chickenpox.  She is afebrile.  No rash to the face or edema.  Amount and/or Complexity of Data Reviewed Discussion of management or test interpretation with external provider(s): Patient here with likely plant dermatitis, I suspect exposure to poison oak or poison ivy.  Given IM Decadron here.  Will provide short course of oral steroid as well.  She will continue symptomatic treatment with Benadryl and I have recommended Aveeno oatmeal bath or calamine lotion topically.  Risk Prescription drug management.           Final Clinical Impression(s) / ED Diagnoses Final diagnoses:  Plant dermatitis    Rx / DC Orders ED Discharge Orders          Ordered    predniSONE (DELTASONE) 20 MG tablet  Daily        05/10/22 1805              Kem Parkinson, PA-C 05/12/22 1507    Milton Ferguson, MD 05/14/22 [phone]
